# Patient Record
Sex: Female | Born: 1955
Health system: Southern US, Community
[De-identification: ages and names within clinical notes are randomized; demographics above are authoritative.]

## PROBLEM LIST (undated history)

## (undated) ENCOUNTER — Emergency Department (HOSPITAL_COMMUNITY): Payer: Medicare Other

## (undated) DIAGNOSIS — R5383 Other fatigue: Secondary | ICD-10-CM

## (undated) DIAGNOSIS — L989 Disorder of the skin and subcutaneous tissue, unspecified: Secondary | ICD-10-CM

## (undated) DIAGNOSIS — E7800 Pure hypercholesterolemia, unspecified: Secondary | ICD-10-CM

## (undated) DIAGNOSIS — E039 Hypothyroidism, unspecified: Secondary | ICD-10-CM

## (undated) DIAGNOSIS — D649 Anemia, unspecified: Secondary | ICD-10-CM

## (undated) DIAGNOSIS — J45909 Unspecified asthma, uncomplicated: Secondary | ICD-10-CM

## (undated) DIAGNOSIS — I10 Essential (primary) hypertension: Secondary | ICD-10-CM

## (undated) DIAGNOSIS — F329 Major depressive disorder, single episode, unspecified: Secondary | ICD-10-CM

## (undated) DIAGNOSIS — M1990 Unspecified osteoarthritis, unspecified site: Secondary | ICD-10-CM

## (undated) DIAGNOSIS — R011 Cardiac murmur, unspecified: Secondary | ICD-10-CM

## (undated) DIAGNOSIS — F32A Depression, unspecified: Secondary | ICD-10-CM

## (undated) DIAGNOSIS — E785 Hyperlipidemia, unspecified: Secondary | ICD-10-CM

## (undated) DIAGNOSIS — J40 Bronchitis, not specified as acute or chronic: Secondary | ICD-10-CM

## (undated) DIAGNOSIS — E669 Obesity, unspecified: Secondary | ICD-10-CM

## (undated) DIAGNOSIS — G4700 Insomnia, unspecified: Secondary | ICD-10-CM

## (undated) HISTORY — DX: Essential (primary) hypertension: I10

## (undated) HISTORY — DX: Pure hypercholesterolemia, unspecified: E78.00

## (undated) HISTORY — DX: Obesity, unspecified: E66.9

## (undated) HISTORY — DX: Insomnia, unspecified: G47.00

## (undated) HISTORY — DX: Depression, unspecified: F32.A

## (undated) HISTORY — PX: BREAST IMPLANT REMOVAL: SUR1101

## (undated) HISTORY — PX: OTHER SURGICAL HISTORY: SHX169

## (undated) HISTORY — DX: Major depressive disorder, single episode, unspecified: F32.9

## (undated) HISTORY — DX: Disorder of the skin and subcutaneous tissue, unspecified: L98.9

## (undated) HISTORY — DX: Hyperlipidemia, unspecified: E78.5

## (undated) HISTORY — PX: CARPAL TUNNEL RELEASE: SHX101

## (undated) HISTORY — DX: Other fatigue: R53.83

---

## 1974-01-27 HISTORY — PX: APPENDECTOMY: SHX54

## 1974-01-27 HISTORY — PX: OOPHORECTOMY: SHX86

## 1990-01-27 HISTORY — PX: ENDOMETRIAL FULGURATION: SHX1500

## 1998-05-14 ENCOUNTER — Other Ambulatory Visit: Admission: RE | Admit: 1998-05-14 | Discharge: 1998-05-14 | Payer: Self-pay | Admitting: *Deleted

## 1998-12-18 ENCOUNTER — Ambulatory Visit (HOSPITAL_COMMUNITY): Admission: RE | Admit: 1998-12-18 | Discharge: 1998-12-18 | Payer: Self-pay | Admitting: Gastroenterology

## 1998-12-18 ENCOUNTER — Encounter (INDEPENDENT_AMBULATORY_CARE_PROVIDER_SITE_OTHER): Payer: Self-pay

## 1999-05-29 ENCOUNTER — Other Ambulatory Visit: Admission: RE | Admit: 1999-05-29 | Discharge: 1999-05-29 | Payer: Self-pay | Admitting: *Deleted

## 1999-06-29 ENCOUNTER — Emergency Department (HOSPITAL_COMMUNITY): Admission: EM | Admit: 1999-06-29 | Discharge: 1999-06-29 | Payer: Self-pay | Admitting: Emergency Medicine

## 1999-10-04 ENCOUNTER — Other Ambulatory Visit: Admission: RE | Admit: 1999-10-04 | Discharge: 1999-10-04 | Payer: Self-pay | Admitting: *Deleted

## 2000-03-27 ENCOUNTER — Other Ambulatory Visit: Admission: RE | Admit: 2000-03-27 | Discharge: 2000-03-27 | Payer: Self-pay | Admitting: *Deleted

## 2000-07-09 ENCOUNTER — Encounter: Payer: Self-pay | Admitting: Cardiology

## 2000-07-09 ENCOUNTER — Encounter: Admission: RE | Admit: 2000-07-09 | Discharge: 2000-07-09 | Payer: Self-pay | Admitting: Cardiology

## 2001-04-12 ENCOUNTER — Other Ambulatory Visit: Admission: RE | Admit: 2001-04-12 | Discharge: 2001-04-12 | Payer: Self-pay | Admitting: *Deleted

## 2002-04-25 ENCOUNTER — Other Ambulatory Visit: Admission: RE | Admit: 2002-04-25 | Discharge: 2002-04-25 | Payer: Self-pay | Admitting: *Deleted

## 2003-05-08 ENCOUNTER — Other Ambulatory Visit: Admission: RE | Admit: 2003-05-08 | Discharge: 2003-05-08 | Payer: Self-pay | Admitting: *Deleted

## 2003-06-21 ENCOUNTER — Emergency Department (HOSPITAL_COMMUNITY): Admission: EM | Admit: 2003-06-21 | Discharge: 2003-06-22 | Payer: Self-pay | Admitting: Emergency Medicine

## 2003-09-05 ENCOUNTER — Encounter: Admission: RE | Admit: 2003-09-05 | Discharge: 2003-09-05 | Payer: Self-pay | Admitting: Orthopedic Surgery

## 2004-05-23 ENCOUNTER — Other Ambulatory Visit: Admission: RE | Admit: 2004-05-23 | Discharge: 2004-05-23 | Payer: Self-pay | Admitting: *Deleted

## 2004-08-15 ENCOUNTER — Encounter: Admission: RE | Admit: 2004-08-15 | Discharge: 2004-08-15 | Payer: Self-pay | Admitting: Orthopedic Surgery

## 2005-01-31 ENCOUNTER — Encounter: Admission: RE | Admit: 2005-01-31 | Discharge: 2005-01-31 | Payer: Self-pay | Admitting: Orthopedic Surgery

## 2005-05-30 ENCOUNTER — Other Ambulatory Visit: Admission: RE | Admit: 2005-05-30 | Discharge: 2005-05-30 | Payer: Self-pay | Admitting: Gynecology

## 2005-09-17 ENCOUNTER — Encounter: Admission: RE | Admit: 2005-09-17 | Discharge: 2005-09-17 | Payer: Self-pay | Admitting: Orthopedic Surgery

## 2006-02-02 ENCOUNTER — Encounter: Admission: RE | Admit: 2006-02-02 | Discharge: 2006-02-02 | Payer: Self-pay | Admitting: Cardiology

## 2006-06-03 ENCOUNTER — Other Ambulatory Visit: Admission: RE | Admit: 2006-06-03 | Discharge: 2006-06-03 | Payer: Self-pay | Admitting: Obstetrics and Gynecology

## 2007-02-04 ENCOUNTER — Encounter: Admission: RE | Admit: 2007-02-04 | Discharge: 2007-02-04 | Payer: Self-pay | Admitting: Obstetrics and Gynecology

## 2007-06-07 ENCOUNTER — Other Ambulatory Visit: Admission: RE | Admit: 2007-06-07 | Discharge: 2007-06-07 | Payer: Self-pay | Admitting: Obstetrics and Gynecology

## 2008-02-07 ENCOUNTER — Encounter: Admission: RE | Admit: 2008-02-07 | Discharge: 2008-02-07 | Payer: Self-pay | Admitting: Cardiology

## 2008-06-28 ENCOUNTER — Encounter: Admission: RE | Admit: 2008-06-28 | Discharge: 2008-06-28 | Payer: Self-pay | Admitting: Cardiology

## 2009-01-09 ENCOUNTER — Ambulatory Visit (HOSPITAL_BASED_OUTPATIENT_CLINIC_OR_DEPARTMENT_OTHER): Admission: RE | Admit: 2009-01-09 | Discharge: 2009-01-09 | Payer: Self-pay | Admitting: Orthopedic Surgery

## 2009-02-07 ENCOUNTER — Encounter: Admission: RE | Admit: 2009-02-07 | Discharge: 2009-02-07 | Payer: Self-pay | Admitting: Obstetrics and Gynecology

## 2009-12-25 ENCOUNTER — Encounter: Admission: RE | Admit: 2009-12-25 | Discharge: 2009-12-25 | Payer: Self-pay | Admitting: Cardiology

## 2009-12-27 ENCOUNTER — Telehealth (INDEPENDENT_AMBULATORY_CARE_PROVIDER_SITE_OTHER): Payer: Self-pay | Admitting: *Deleted

## 2010-02-08 ENCOUNTER — Encounter
Admission: RE | Admit: 2010-02-08 | Discharge: 2010-02-08 | Payer: Self-pay | Source: Home / Self Care | Attending: Obstetrics and Gynecology | Admitting: Obstetrics and Gynecology

## 2010-02-17 ENCOUNTER — Encounter: Payer: Self-pay | Admitting: Orthopedic Surgery

## 2010-02-28 NOTE — Progress Notes (Signed)
Summary: appt   Phone Note Call from Patient Call back at [phone]   Caller: Patient Call For: wright Summary of Call: Pt had to cancel her previous appt on 12/8,  was offered another on 12/15, states she needs an earlier appt, pls advise. Initial call taken by: Darletta Moll,  December 27, 2009 3:17 PM  Follow-up for Phone Call        Called number provided above - [facility]  ATC pt's home number - NA and unable to leave message.  Pt returned call.  She was placed on bump list form 01/03/10 10am appt.  She states she has not seen Dr. Deborah Chalk but spoke with his nurses and was told Dr. Deborah Chalk would order CXR but he recs she start off seeing pulmonary and recommend Dr. Delford Field.  Had CXR at [facility].  Advised PW's first available is Dec 8 at 11am in [place] -- pt ok with this app.  Advised to bring recs from previous dr, arrive 15 minutes early, and bring all meds to OV.  She verbalized understanding.  Follow-up by: Gweneth Dimitri RN,  December 27, 2009 4:02 PM

## 2010-04-30 LAB — BASIC METABOLIC PANEL
BUN: 8 mg/dL (ref 6–23)
CO2: 28 mEq/L (ref 19–32)
Calcium: 9.5 mg/dL (ref 8.4–10.5)
Chloride: 105 mEq/L (ref 96–112)
Creatinine, Ser: 0.5 mg/dL (ref 0.4–1.2)
GFR calc Af Amer: >60 mL/min (ref >60)
GFR calc non Af Amer: >60 mL/min (ref >60)
Glucose, Bld: 106 mg/dL — ABNORMAL HIGH (ref 70–99)
Potassium: 4.3 mEq/L (ref 3.5–5.1)
Sodium: 138 mEq/L (ref 135–145)

## 2010-04-30 LAB — POCT HEMOGLOBIN-HEMACUE: Hemoglobin: 13.6 g/dL (ref 12.0–15.0)

## 2010-06-11 ENCOUNTER — Telehealth: Payer: Self-pay | Admitting: Cardiology

## 2010-06-11 ENCOUNTER — Other Ambulatory Visit: Payer: Self-pay | Admitting: *Deleted

## 2010-06-11 DIAGNOSIS — E7800 Pure hypercholesterolemia, unspecified: Secondary | ICD-10-CM

## 2010-06-11 DIAGNOSIS — R5383 Other fatigue: Secondary | ICD-10-CM

## 2010-06-11 NOTE — Telephone Encounter (Signed)
Wanted to schedule labs prior to ov w/ Dr. Deborah Chalk. Done

## 2010-06-11 NOTE — Telephone Encounter (Signed)
WANTS TO COME IN EARLY FOR LABS- I DID NOT SEE ANY ORDERS FOR LABS CAN YOU CHECK AND CALL PATIENT BACK WITH APPOINTMENT TIME FOR LABS PRIOR  TO MD VISIT

## 2010-06-20 ENCOUNTER — Other Ambulatory Visit: Payer: Self-pay | Admitting: *Deleted

## 2010-06-20 DIAGNOSIS — E7800 Pure hypercholesterolemia, unspecified: Secondary | ICD-10-CM

## 2010-06-20 DIAGNOSIS — R5383 Other fatigue: Secondary | ICD-10-CM

## 2010-06-21 ENCOUNTER — Other Ambulatory Visit (INDEPENDENT_AMBULATORY_CARE_PROVIDER_SITE_OTHER): Payer: BC Managed Care – PPO | Admitting: *Deleted

## 2010-06-21 ENCOUNTER — Other Ambulatory Visit: Payer: Self-pay | Admitting: *Deleted

## 2010-06-21 DIAGNOSIS — Z79899 Other long term (current) drug therapy: Secondary | ICD-10-CM

## 2010-06-21 DIAGNOSIS — E7800 Pure hypercholesterolemia, unspecified: Secondary | ICD-10-CM

## 2010-06-21 LAB — BASIC METABOLIC PANEL
BUN: 9 mg/dL (ref 6–23)
CO2: 26 mEq/L (ref 19–32)
Calcium: 8.8 mg/dL (ref 8.4–10.5)
Chloride: 104 mEq/L (ref 96–112)
Creatinine, Ser: 0.5 mg/dL (ref 0.4–1.2)
GFR: 142.1 mL/min (ref >60.00)
Glucose, Bld: 131 mg/dL — ABNORMAL HIGH (ref 70–99)
Potassium: 4.1 mEq/L (ref 3.5–5.1)
Sodium: 137 mEq/L (ref 135–145)

## 2010-06-21 LAB — LIPID PANEL
Cholesterol: 190 mg/dL (ref 0–200)
HDL: 44.1 mg/dL (ref >39.00)
LDL Cholesterol: 110 mg/dL — ABNORMAL HIGH (ref 0–99)
Total CHOL/HDL Ratio: 4
Triglycerides: 182 mg/dL — ABNORMAL HIGH (ref 0.0–149.0)
VLDL: 36.4 mg/dL (ref 0.0–40.0)

## 2010-06-21 LAB — TSH: TSH: 2.6 u[IU]/mL (ref 0.35–5.50)

## 2010-06-21 LAB — CBC WITH DIFFERENTIAL/PLATELET
Basophils Absolute: 0 10*3/uL (ref 0.0–0.1)
Basophils Relative: 0.5 % (ref 0.0–3.0)
Eosinophils Absolute: 0.1 10*3/uL (ref 0.0–0.7)
Eosinophils Relative: 2.2 % (ref 0.0–5.0)
HCT: 38.3 % (ref 36.0–46.0)
Hemoglobin: 13.3 g/dL (ref 12.0–15.0)
Lymphocytes Relative: 38.8 % (ref 12.0–46.0)
Lymphs Abs: 1.5 10*3/uL (ref 0.7–4.0)
MCHC: 34.9 g/dL (ref 30.0–36.0)
MCV: 90.5 fl (ref 78.0–100.0)
Monocytes Absolute: 0.3 10*3/uL (ref 0.1–1.0)
Monocytes Relative: 9 % (ref 3.0–12.0)
Neutro Abs: 1.9 10*3/uL (ref 1.4–7.7)
Neutrophils Relative %: 49.5 % (ref 43.0–77.0)
Platelets: 221 10*3/uL (ref 150.0–400.0)
RBC: 4.23 Mil/uL (ref 3.87–5.11)
RDW: 12.5 % (ref 11.5–14.6)
WBC: 3.9 10*3/uL — ABNORMAL LOW (ref 4.5–10.5)

## 2010-06-21 LAB — HEPATIC FUNCTION PANEL
ALT: 35 U/L (ref 0–35)
AST: 27 U/L (ref 0–37)
Albumin: 4 g/dL (ref 3.5–5.2)
Alkaline Phosphatase: 75 U/L (ref 39–117)
Bilirubin, Direct: 0.1 mg/dL (ref 0.0–0.3)
Total Bilirubin: 0.5 mg/dL (ref 0.3–1.2)
Total Protein: 6.5 g/dL (ref 6.0–8.3)

## 2010-06-21 MED ORDER — CITALOPRAM HYDROBROMIDE 40 MG PO TABS: 40.0000 mg | ORAL_TABLET | Freq: Every day | ORAL | Status: DC

## 2010-06-21 NOTE — Telephone Encounter (Signed)
Citalopram 40mg  #30/5 refills faxed to [facility] per Norma Fredrickson, NP -this is ok

## 2010-06-26 ENCOUNTER — Other Ambulatory Visit: Payer: Self-pay | Admitting: *Deleted

## 2010-06-26 ENCOUNTER — Telehealth: Payer: Self-pay | Admitting: *Deleted

## 2010-06-26 NOTE — Telephone Encounter (Signed)
LMOM to call back for lab work

## 2010-06-26 NOTE — Telephone Encounter (Signed)
Results reported to pt and faxed to pt

## 2010-06-26 NOTE — Telephone Encounter (Signed)
Pt returning your call for lab results  °

## 2010-06-28 ENCOUNTER — Encounter: Payer: Self-pay | Admitting: *Deleted

## 2010-06-28 ENCOUNTER — Ambulatory Visit (INDEPENDENT_AMBULATORY_CARE_PROVIDER_SITE_OTHER): Payer: BC Managed Care – PPO | Admitting: Cardiology

## 2010-06-28 ENCOUNTER — Encounter: Payer: Self-pay | Admitting: Cardiology

## 2010-06-28 DIAGNOSIS — R7309 Other abnormal glucose: Secondary | ICD-10-CM

## 2010-06-28 DIAGNOSIS — I10 Essential (primary) hypertension: Secondary | ICD-10-CM | POA: Insufficient documentation

## 2010-06-28 DIAGNOSIS — E785 Hyperlipidemia, unspecified: Secondary | ICD-10-CM

## 2010-06-28 DIAGNOSIS — E669 Obesity, unspecified: Secondary | ICD-10-CM

## 2010-06-28 DIAGNOSIS — R7303 Prediabetes: Secondary | ICD-10-CM | POA: Insufficient documentation

## 2010-06-28 NOTE — Progress Notes (Signed)
Subjective:   Megan Savage is seen today for followup visit. She has a history of mild hypertension, obesity, and lipid disorder. In general, she's been doing well but is not able to exercise because of a busy schedule. Lab work is reviewed. Her glucose was 131 and LDL cholesterol was 110. Weight loss would correct all of her problems  Current Outpatient Prescriptions  Medication Sig Dispense Refill  . aspirin 81 MG tablet Take 81 mg by mouth daily.        . calcium citrate-vitamin D 200-200 MG-UNIT TABS Take 2 tablets by mouth daily.       . Cholecalciferol (VITAMIN D PO) Take 1 tablet by mouth every 30 (thirty) days.        . citalopram (CELEXA) 40 MG tablet Take 1 tablet (40 mg total) by mouth daily.  30 tablet  5  . Doxycycline Hyclate (DORYX PO) Take 1 capsule by mouth daily.        Marland Kitchen estradiol (VIVELLE-DOT) 0.025 MG/24HR Place 1 patch onto the skin 2 (two) times a week.        . Flaxseed, Linseed, (FLAXSEED OIL PO) Take 2 capsules by mouth daily.        Marland Kitchen levothyroxine (SYNTHROID, LEVOTHROID) 25 MCG tablet Take 25 mcg by mouth daily.        . MedroxyPROGESTERone Acetate (PROVERA PO) Take by mouth. Days 1-12 of every month      . multivitamin (THERAGRAN) per tablet Take 1 tablet by mouth daily.        Marland Kitchen omega-3 acid ethyl esters (LOVAZA) 1 G capsule Take 2 g by mouth 2 (two) times daily.       . valsartan (DIOVAN) 80 MG tablet Take 80 mg by mouth daily.          No Known Allergies  There is no problem list on file for this patient.   History  Smoking status  . Never Smoker   Smokeless tobacco  . Never Used    History  Alcohol Use: Not on file    Family History  Problem Relation Age of Onset  . Arrhythmia Mother   . Cancer Brother   . Cancer Father     Review of Systems:   The patient denies any heat or cold intolerance.  No weight gain or weight loss.  The patient denies headaches or blurry vision.  There is no cough or sputum production.  The patient denies dizziness.  There  is no hematuria or hematochezia.  The patient denies any muscle aches or arthritis.  The patient denies any rash.  The patient denies frequent falling or instability.  There is no history of depression or anxiety.  All other systems were reviewed and are negative.   Physical Exam:    Assessment / Plan:

## 2010-06-28 NOTE — Assessment & Plan Note (Signed)
Lipids are reasonably well controlled by to encourage weight loss. I'll have her see Dr. Timothy Lasso in followup for evaluation of lipids, weight loss, and glucose intolerance.

## 2010-07-03 NOTE — Progress Notes (Signed)
Report to pt.

## 2010-07-05 ENCOUNTER — Telehealth: Payer: Self-pay | Admitting: *Deleted

## 2010-07-05 NOTE — Telephone Encounter (Signed)
JoAnne from [facility] called to inform us she will call pt to set pt up to see Dr. Timothy Lasso.

## 2010-10-02 ENCOUNTER — Other Ambulatory Visit: Payer: Self-pay | Admitting: Critical Care Medicine

## 2010-10-03 ENCOUNTER — Ambulatory Visit (INDEPENDENT_AMBULATORY_CARE_PROVIDER_SITE_OTHER): Payer: BC Managed Care – PPO | Admitting: Critical Care Medicine

## 2010-10-03 ENCOUNTER — Ambulatory Visit (HOSPITAL_BASED_OUTPATIENT_CLINIC_OR_DEPARTMENT_OTHER)
Admission: RE | Admit: 2010-10-03 | Discharge: 2010-10-03 | Disposition: A | Discharge status: Home / Self Care | Payer: BC Managed Care – PPO | Source: Ambulatory Visit | Attending: Critical Care Medicine | Admitting: Critical Care Medicine

## 2010-10-03 ENCOUNTER — Encounter: Payer: Self-pay | Admitting: Critical Care Medicine

## 2010-10-03 VITALS — BP 140/88 | HR 80 | Temp 98.8°F | Ht 64.5 in | Wt 194.5 lb

## 2010-10-03 DIAGNOSIS — R059 Cough, unspecified: Secondary | ICD-10-CM

## 2010-10-03 DIAGNOSIS — K219 Gastro-esophageal reflux disease without esophagitis: Secondary | ICD-10-CM

## 2010-10-03 DIAGNOSIS — I10 Essential (primary) hypertension: Secondary | ICD-10-CM | POA: Insufficient documentation

## 2010-10-03 DIAGNOSIS — R05 Cough: Secondary | ICD-10-CM | POA: Insufficient documentation

## 2010-10-03 DIAGNOSIS — J45909 Unspecified asthma, uncomplicated: Secondary | ICD-10-CM | POA: Insufficient documentation

## 2010-10-03 DIAGNOSIS — R053 Chronic cough: Secondary | ICD-10-CM | POA: Insufficient documentation

## 2010-10-03 MED ORDER — HYDROCOD POLST-CPM POLST ER 10-8 MG PO CP12: 1.0000 | ORAL_CAPSULE | Freq: Two times a day (BID) | ORAL | Status: DC | PRN

## 2010-10-03 MED ORDER — BENZONATATE 100 MG PO CAPS: ORAL_CAPSULE | ORAL | Status: AC

## 2010-10-03 MED ORDER — PREDNISONE 10 MG PO TABS: ORAL_TABLET | ORAL | Status: DC

## 2010-10-03 MED ORDER — OMEPRAZOLE 20 MG PO CPDR: 20.0000 mg | DELAYED_RELEASE_CAPSULE | Freq: Every day | ORAL | Status: DC

## 2010-10-03 MED ORDER — FISH OIL 1000 MG PO CAPS: ORAL_CAPSULE | ORAL | Status: DC

## 2010-10-03 MED ORDER — BENZONATATE 100 MG PO CAPS: ORAL_CAPSULE | ORAL | Status: DC

## 2010-10-03 NOTE — Patient Instructions (Addendum)
Follow cyclic cough protocol When medrol is finished, start prednisone pulse Take omeprazole one daily before breakfast Follow STRICT reflux diet HOLD fish oil and flaxseed oil Return 2 weeks [facility] office or [facility] office

## 2010-10-03 NOTE — Progress Notes (Signed)
Subjective:    Patient ID: Megan Savage, female    DOB: July 11, 1955, 55 y.o.   MRN: [id number]  HPI Comments: Cough for 4 weeks.  Started with sore throat lasted 24hrs then started   Cough This is a new problem. The current episode started more than 1 month ago. The problem has been waxing and waning (Better in the am). The problem occurs constantly (worse with talking and exertion). The cough is productive of sputum (clear mucus,  not green or discolored ). Associated symptoms include wheezing. Pertinent negatives include no chest pain, chills, ear congestion, ear pain, fever, headaches, heartburn, hemoptysis, myalgias, nasal congestion, postnasal drip, rash, rhinorrhea, sore throat, shortness of breath or sweats. Associated symptoms comments: Fatigued from coughing . The symptoms are aggravated by exercise, lying down and stress (worse at night and as day goes on). Risk factors for lung disease include smoking/tobacco exposure (passive exposure ). She has tried a beta-agonist inhaler, oral steroids and steroid inhaler (saw gene Gallina, zpak, pred, symbicort.  none of this helped ) for the symptoms. The treatment provided no relief. Her past medical history is significant for asthma.   55 y.o. WF with cough  Past Medical History  Diagnosis Date  . Hypertension     controlled  . Hyperlipidemia   . Obesity   . Hypercholesterolemia   . Fatigue   . Depression   . Skin disorder   . Insomnia      Family History  Problem Relation Age of Onset  . Arrhythmia Mother   . Cancer Brother     leumkemia  . Cancer Father   . Emphysema Mother   . Asthma Mother   . Asthma Brother   . Asthma Paternal Aunt      History   Social History  . Marital Status: Divorced    Spouse Name: N/A    Number of Children: N/A  . Years of Education: N/A   Occupational History  . Science writer    Social History Main Topics  . Smoking status: Never Smoker   . Smokeless tobacco: Never Used  .  Alcohol Use: Yes     2-3 glasses wine daily  . Drug Use: Not on file  . Sexually Active: Not on file   Other Topics Concern  . Not on file   Social History Narrative  . No narrative on file     No Known Allergies   Outpatient Prescriptions Prior to Visit  Medication Sig Dispense Refill  . aspirin 81 MG tablet Take 81 mg by mouth daily.        . calcium citrate-vitamin D 200-200 MG-UNIT TABS Take 2 tablets by mouth daily.       . Cholecalciferol (VITAMIN D PO) Take 1 tablet by mouth every 30 (thirty) days.        . citalopram (CELEXA) 40 MG tablet Take 1 tablet (40 mg total) by mouth daily.  30 tablet  5  . Doxycycline Hyclate (DORYX PO) Take 1 capsule by mouth. 150 mg three times weekly      . estradiol (VIVELLE-DOT) 0.025 MG/24HR Place 1 patch onto the skin 2 (two) times a week.        . Flaxseed, Linseed, (FLAXSEED OIL PO) Take 2 capsules by mouth daily.        Marland Kitchen levothyroxine (SYNTHROID, LEVOTHROID) 25 MCG tablet Take 25 mcg by mouth daily.        . MedroxyPROGESTERone Acetate (PROVERA PO) Take by mouth. Days 1-12  of every month      . multivitamin (THERAGRAN) per tablet Take 1 tablet by mouth daily.        . valsartan (DIOVAN) 80 MG tablet Take 80 mg by mouth daily.        Marland Kitchen omega-3 acid ethyl esters (LOVAZA) 1 G capsule Take 2 g by mouth 2 (two) times daily.           Review of Systems  Constitutional: Positive for activity change. Negative for fever, chills, diaphoresis, appetite change, fatigue and unexpected weight change.  HENT: Positive for congestion and voice change. Negative for hearing loss, ear pain, nosebleeds, sore throat, facial swelling, rhinorrhea, sneezing, mouth sores, trouble swallowing, neck pain, neck stiffness, dental problem, postnasal drip, sinus pressure, tinnitus and ear discharge.   Eyes: Negative for photophobia, discharge, itching and visual disturbance.  Respiratory: Positive for cough and wheezing. Negative for apnea, hemoptysis, choking, chest  tightness, shortness of breath and stridor.   Cardiovascular: Negative for chest pain, palpitations and leg swelling.  Gastrointestinal: Negative for heartburn, nausea, vomiting, abdominal pain, constipation, blood in stool and abdominal distention.  Genitourinary: Negative for dysuria, urgency, frequency, hematuria, flank pain, decreased urine volume and difficulty urinating.  Musculoskeletal: Negative for myalgias, back pain, joint swelling, arthralgias and gait problem.  Skin: Negative for color change, pallor and rash.  Neurological: Negative for dizziness, tremors, seizures, syncope, speech difficulty, weakness, light-headedness, numbness and headaches.  Hematological: Negative for adenopathy. Does not bruise/bleed easily.  Psychiatric/Behavioral: Positive for sleep disturbance. Negative for confusion and agitation. The patient is not nervous/anxious.        Objective:   Physical Exam Filed Vitals:   10/03/10 0952  BP: 140/88  Pulse: 80  Temp: 98.8 F (37.1 C)  TempSrc: Oral  Height: 5' 4.5" (1.638 m)  Weight: 194 lb 8 oz (88.225 kg)  SpO2: 97%    Gen: Pleasant, mildly obese , in no distress,  normal affect  ENT: No lesions,  mouth clear,  oropharynx clear, no postnasal drip, mild pharyngeal  erythema  Neck: No JVD, no TMG, no carotid bruits  Lungs: No use of accessory muscles, no dullness to percussion, prominent  pseudo wheeze  Cardiovascular: RRR, heart sounds normal, no murmur or gallops, no peripheral edema  Abdomen: soft and NT, no HSM,  BS normal  Musculoskeletal: No deformities, no cyanosis or clubbing  Neuro: alert, non focal  Skin: Warm, no lesions or rashes  Unable to perform spirometry  10/03/10 CXR: NAD    Assessment & Plan:   Cough 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   Likely laryngopharyngeal reflux induced.   No true asthma or lower airway inflammation.  Plan Follow cyclic cough protocol with hycodan cough  syrup/benzonatate When medrol is finished, start prednisone pulse Take omeprazole one daily before breakfast Follow STRICT reflux diet HOLD fish oil and flaxseed oil Return 2 weeks [facility] office or [facility] office      Updated Medication List Outpatient Encounter Prescriptions as of 10/03/2010  Medication Sig Dispense Refill  . aspirin 81 MG tablet Take 81 mg by mouth daily.        Marland Kitchen azithromycin (ZITHROMAX) 250 MG tablet Take as directed      . calcium citrate-vitamin D 200-200 MG-UNIT TABS Take 2 tablets by mouth daily.       . Cholecalciferol (VITAMIN D PO) Take 1 tablet by mouth every 30 (thirty) days.        . citalopram (CELEXA) 40 MG tablet Take  1 tablet (40 mg total) by mouth daily.  30 tablet  5  . Doxycycline Hyclate (DORYX PO) Take 1 capsule by mouth. 150 mg three times weekly      . estradiol (VIVELLE-DOT) 0.025 MG/24HR Place 1 patch onto the skin 2 (two) times a week.        . Flaxseed, Linseed, (FLAXSEED OIL PO) Take 2 capsules by mouth daily.        Marland Kitchen glucosamine-chondroitin 500-400 MG tablet Take 2 tablets by mouth daily.       Marland Kitchen levothyroxine (SYNTHROID, LEVOTHROID) 25 MCG tablet Take 25 mcg by mouth daily.        . MedroxyPROGESTERone Acetate (PROVERA PO) Take by mouth. Days 1-12 of every month      . multivitamin (THERAGRAN) per tablet Take 1 tablet by mouth daily.        . Omega-3 Fatty Acids (FISH OIL) 1000 MG CAPS HOLD      . valsartan (DIOVAN) 80 MG tablet Take 80 mg by mouth daily.        Marland Kitchen DISCONTD: methylPREDNISolone (MEDROL) 4 MG tablet Take as directed      . DISCONTD: Omega-3 Fatty Acids (FISH OIL) 1000 MG CAPS Take 1 capsule by mouth daily.        . benzonatate (TESSALON) 100 MG capsule Take one every 4 hours per cough protocol  90 capsule  4  . Hydrocod Polst-Chlorphen Polst (TUSSICAPS) 10-8 MG CP12 Take 1 capsule by mouth 2 (two) times daily as needed. Per cough protocol  12 each  0  . omeprazole (PRILOSEC) 20 MG capsule Take 1 capsule (20 mg total) by  mouth daily.  30 capsule  1  . predniSONE (DELTASONE) 10 MG tablet Take 4 for two days, three for two days, two for two days, then one for two days and stop   20 tablet  0  . DISCONTD: benzonatate (TESSALON) 100 MG capsule Take one every 4 hours per cough protocol  90 capsule  4  . DISCONTD: omega-3 acid ethyl esters (LOVAZA) 1 G capsule Take 2 g by mouth 2 (two) times daily.       Marland Kitchen DISCONTD: omeprazole (PRILOSEC) 20 MG capsule Take 1 capsule (20 mg total) by mouth daily.  30 capsule  1  . DISCONTD: predniSONE (DELTASONE) 10 MG tablet Take 4 for two days, three for two days, two for two days, then one for two days and stop   20 tablet  0

## 2010-10-04 NOTE — Assessment & Plan Note (Addendum)
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   Likely laryngopharyngeal reflux induced.   No true asthma or lower airway inflammation.  Plan Follow cyclic cough protocol with hycodan cough syrup/benzonatate When medrol is finished, start prednisone pulse Take omeprazole one daily before breakfast Follow STRICT reflux diet HOLD fish oil and flaxseed oil Return 2 weeks [facility] office or [facility] office

## 2010-10-16 ENCOUNTER — Ambulatory Visit (INDEPENDENT_AMBULATORY_CARE_PROVIDER_SITE_OTHER): Payer: BC Managed Care – PPO | Admitting: Critical Care Medicine

## 2010-10-16 ENCOUNTER — Encounter: Payer: Self-pay | Admitting: Critical Care Medicine

## 2010-10-16 VITALS — BP 140/82 | HR 94 | Temp 99.2°F | Ht 64.5 in | Wt 192.8 lb

## 2010-10-16 DIAGNOSIS — R05 Cough: Secondary | ICD-10-CM

## 2010-10-16 DIAGNOSIS — R059 Cough, unspecified: Secondary | ICD-10-CM

## 2010-10-16 NOTE — Assessment & Plan Note (Signed)
Cyclical cough d/t GERD and upper airway instability, stress induced,  No true asthma Improved with cyclic cough protocol Plan Benzonatate prn Chill fish oil Finish current omeprazole rx  Cont candy lozenge . Swallow training Stress modification Reflux diet rov prn

## 2010-10-16 NOTE — Patient Instructions (Addendum)
When current omeprazole runs out, ok to not refill Continue to follow reflux diet Hold flaxseed and fish oil for one more week, when you restart, chill capsules Continue benzonatate as needed Continue lozenge in throat and retrain yourself to not clear throat, swallow Return as needed

## 2010-10-16 NOTE — Progress Notes (Signed)
Subjective:    Patient ID: Megan Savage, female    DOB: 1955-06-25, 55 y.o.   MRN: [id number]  HPI Comments: Cough for 4 weeks.  Started with sore throat lasted 24hrs then started   Cough This is a new problem. The current episode started more than 1 month ago. The problem has been rapidly improving (Better in the am). The cough is productive of sputum (clear mucus,  not green or discolored ). Pertinent negatives include no chest pain, chills, ear congestion, ear pain, fever, headaches, heartburn, hemoptysis, myalgias, nasal congestion, postnasal drip, rash, rhinorrhea, sore throat, shortness of breath, sweats or wheezing. The symptoms are aggravated by exercise, lying down and stress (worse at night and as day goes on). Risk factors for lung disease include smoking/tobacco exposure (passive exposure ). She has tried a beta-agonist inhaler, oral steroids and steroid inhaler for the symptoms. The treatment provided no relief. There is no history of asthma.     10/16/2010 Pt is markedly better.  Less dyspneic.  Cough protocol helped Pt denies any significant sore throat, nasal congestion or excess secretions, fever, chills, sweats, unintended weight loss, pleurtic or exertional chest pain, orthopnea PND, or leg swelling Pt denies any increase in rescue therapy over baseline, denies waking up needing it or having any early am or nocturnal exacerbations of coughing/wheezing/or dyspnea. Pt also denies any obvious fluctuation in symptoms with  weather or environmental change or other alleviating or aggravating factors   Past Medical History  Diagnosis Date  . Hypertension     controlled  . Hyperlipidemia   . Obesity   . Hypercholesterolemia   . Fatigue   . Depression   . Skin disorder   . Insomnia      Family History  Problem Relation Age of Onset  . Arrhythmia Mother   . Cancer Brother     leumkemia  . Cancer Father   . Emphysema Mother   . Asthma Mother   . Asthma Brother   .  Asthma Paternal Aunt      History   Social History  . Marital Status: Divorced    Spouse Name: N/A    Number of Children: N/A  . Years of Education: N/A   Occupational History  . Science writer    Social History Main Topics  . Smoking status: Never Smoker   . Smokeless tobacco: Never Used  . Alcohol Use: Yes     2-3 glasses wine daily  . Drug Use: Not on file  . Sexually Active: Not on file   Other Topics Concern  . Not on file   Social History Narrative  . No narrative on file     No Known Allergies   Outpatient Prescriptions Prior to Visit  Medication Sig Dispense Refill  . aspirin 81 MG tablet Take 81 mg by mouth daily.        . calcium citrate-vitamin D 200-200 MG-UNIT TABS Take 2 tablets by mouth daily.       . Cholecalciferol (VITAMIN D PO) Take 1 tablet by mouth every 30 (thirty) days.        . citalopram (CELEXA) 40 MG tablet Take 1 tablet (40 mg total) by mouth daily.  30 tablet  5  . Doxycycline Hyclate (DORYX PO) Take 1 capsule by mouth. 150 mg three times weekly      . estradiol (VIVELLE-DOT) 0.025 MG/24HR Place 1 patch onto the skin 2 (two) times a week.        Marland Kitchen  glucosamine-chondroitin 500-400 MG tablet Take 2 tablets by mouth daily.       Marland Kitchen levothyroxine (SYNTHROID, LEVOTHROID) 25 MCG tablet Take 25 mcg by mouth daily.        . MedroxyPROGESTERone Acetate (PROVERA PO) Take by mouth. Days 1-12 of every month      . multivitamin (THERAGRAN) per tablet Take 1 tablet by mouth daily.        Marland Kitchen omeprazole (PRILOSEC) 20 MG capsule Take 1 capsule (20 mg total) by mouth daily.  30 capsule  1  . valsartan (DIOVAN) 80 MG tablet Take 80 mg by mouth daily.        . Flaxseed, Linseed, (FLAXSEED OIL PO) Take 2 capsules by mouth daily. ON HOLD      . Omega-3 Fatty Acids (FISH OIL) 1000 MG CAPS HOLD      . azithromycin (ZITHROMAX) 250 MG tablet Take as directed      . Hydrocod Polst-Chlorphen Polst (TUSSICAPS) 10-8 MG CP12 Take 1 capsule by mouth 2 (two) times daily  as needed. Per cough protocol  12 each  0  . predniSONE (DELTASONE) 10 MG tablet Take 4 for two days, three for two days, two for two days, then one for two days and stop   20 tablet  0      Review of Systems  Constitutional: Positive for activity change. Negative for fever, chills, diaphoresis, appetite change, fatigue and unexpected weight change.  HENT: Positive for congestion and voice change. Negative for hearing loss, ear pain, nosebleeds, sore throat, facial swelling, rhinorrhea, sneezing, mouth sores, trouble swallowing, neck pain, neck stiffness, dental problem, postnasal drip, sinus pressure, tinnitus and ear discharge.   Eyes: Negative for photophobia, discharge, itching and visual disturbance.  Respiratory: Positive for cough. Negative for apnea, hemoptysis, choking, chest tightness, shortness of breath, wheezing and stridor.   Cardiovascular: Negative for chest pain, palpitations and leg swelling.  Gastrointestinal: Negative for heartburn, nausea, vomiting, abdominal pain, constipation, blood in stool and abdominal distention.  Genitourinary: Negative for dysuria, urgency, frequency, hematuria, flank pain, decreased urine volume and difficulty urinating.  Musculoskeletal: Negative for myalgias, back pain, joint swelling, arthralgias and gait problem.  Skin: Negative for color change, pallor and rash.  Neurological: Negative for dizziness, tremors, seizures, syncope, speech difficulty, weakness, light-headedness, numbness and headaches.  Hematological: Negative for adenopathy. Does not bruise/bleed easily.  Psychiatric/Behavioral: Positive for sleep disturbance. Negative for confusion and agitation. The patient is not nervous/anxious.        Objective:   Physical Exam  Filed Vitals:   10/16/10 1035  BP: 140/82  Pulse: 94  Temp: 99.2 F (37.3 C)  TempSrc: Oral  Height: 5' 4.5" (1.638 m)  Weight: 192 lb 12.8 oz (87.454 kg)  SpO2: 95%    Gen: Pleasant, mildly obese , in  no distress,  normal affect  ENT: No lesions,  mouth clear,  oropharynx clear, no postnasal drip, mild pharyngeal  erythema  Neck: No JVD, no TMG, no carotid bruits  Lungs: No use of accessory muscles, no dullness to percussion, no pseudowheeze  Cardiovascular: RRR, heart sounds normal, no murmur or gallops, no peripheral edema  Abdomen: soft and NT, no HSM,  BS normal  Musculoskeletal: No deformities, no cyanosis or clubbing  Neuro: alert, non focal  Skin: Warm, no lesions or rashes  Unable to perform spirometry  10/03/10 CXR: NAD    Assessment & Plan:   Cough Cyclical cough d/t GERD and upper airway instability, stress induced,  No true asthma  Improved with cyclic cough protocol Plan Benzonatate prn Chill fish oil Finish current omeprazole rx  Cont candy lozenge . Swallow training Stress modification Reflux diet rov prn       Updated Medication List Outpatient Encounter Prescriptions as of 10/16/2010  Medication Sig Dispense Refill  . aspirin 81 MG tablet Take 81 mg by mouth daily.        . benzonatate (TESSALON) 100 MG capsule Take 1 capsule by mouth Every 4 hours as needed.      . calcium citrate-vitamin D 200-200 MG-UNIT TABS Take 2 tablets by mouth daily.       . Cholecalciferol (VITAMIN D PO) Take 1 tablet by mouth every 30 (thirty) days.        . citalopram (CELEXA) 40 MG tablet Take 1 tablet (40 mg total) by mouth daily.  30 tablet  5  . Doxycycline Hyclate (DORYX PO) Take 1 capsule by mouth. 150 mg three times weekly      . estradiol (VIVELLE-DOT) 0.025 MG/24HR Place 1 patch onto the skin 2 (two) times a week.        Marland Kitchen glucosamine-chondroitin 500-400 MG tablet Take 2 tablets by mouth daily.       Marland Kitchen levothyroxine (SYNTHROID, LEVOTHROID) 25 MCG tablet Take 25 mcg by mouth daily.        . MedroxyPROGESTERone Acetate (PROVERA PO) Take by mouth. Days 1-12 of every month      . multivitamin (THERAGRAN) per tablet Take 1 tablet by mouth daily.        Marland Kitchen  omeprazole (PRILOSEC) 20 MG capsule Take 1 capsule (20 mg total) by mouth daily.  30 capsule  1  . valsartan (DIOVAN) 80 MG tablet Take 80 mg by mouth daily.        . Flaxseed, Linseed, (FLAXSEED OIL PO) Take 2 capsules by mouth daily. ON HOLD      . Omega-3 Fatty Acids (FISH OIL) 1000 MG CAPS HOLD      . DISCONTD: azithromycin (ZITHROMAX) 250 MG tablet Take as directed      . DISCONTD: Hydrocod Polst-Chlorphen Polst (TUSSICAPS) 10-8 MG CP12 Take 1 capsule by mouth 2 (two) times daily as needed. Per cough protocol  12 each  0  . DISCONTD: predniSONE (DELTASONE) 10 MG tablet Take 4 for two days, three for two days, two for two days, then one for two days and stop   20 tablet  0

## 2010-11-26 ENCOUNTER — Other Ambulatory Visit: Payer: Self-pay | Admitting: *Deleted

## 2010-11-26 MED ORDER — LEVOTHYROXINE SODIUM 25 MCG PO TABS: 25.0000 ug | ORAL_TABLET | Freq: Every day | ORAL | Status: DC

## 2011-01-15 ENCOUNTER — Other Ambulatory Visit: Payer: Self-pay | Admitting: Obstetrics and Gynecology

## 2011-01-15 DIAGNOSIS — Z1231 Encounter for screening mammogram for malignant neoplasm of breast: Secondary | ICD-10-CM

## 2011-02-10 ENCOUNTER — Ambulatory Visit
Admission: RE | Admit: 2011-02-10 | Discharge: 2011-02-10 | Disposition: A | Discharge status: Home / Self Care | Payer: BC Managed Care – PPO | Source: Ambulatory Visit | Attending: Obstetrics and Gynecology | Admitting: Obstetrics and Gynecology

## 2011-02-10 DIAGNOSIS — Z1231 Encounter for screening mammogram for malignant neoplasm of breast: Secondary | ICD-10-CM

## 2011-06-16 ENCOUNTER — Other Ambulatory Visit: Payer: Self-pay | Admitting: Cardiology

## 2011-12-12 ENCOUNTER — Other Ambulatory Visit: Payer: Self-pay | Admitting: Obstetrics and Gynecology

## 2011-12-12 DIAGNOSIS — Z1231 Encounter for screening mammogram for malignant neoplasm of breast: Secondary | ICD-10-CM

## 2012-01-22 ENCOUNTER — Other Ambulatory Visit: Payer: Self-pay | Admitting: Obstetrics and Gynecology

## 2012-02-11 ENCOUNTER — Ambulatory Visit: Payer: BC Managed Care – PPO

## 2012-02-12 ENCOUNTER — Ambulatory Visit
Admission: RE | Admit: 2012-02-12 | Discharge: 2012-02-12 | Disposition: A | Discharge status: Home / Self Care | Payer: BC Managed Care – PPO | Source: Ambulatory Visit | Attending: Obstetrics and Gynecology | Admitting: Obstetrics and Gynecology

## 2012-02-12 DIAGNOSIS — Z1231 Encounter for screening mammogram for malignant neoplasm of breast: Secondary | ICD-10-CM

## 2013-01-10 ENCOUNTER — Other Ambulatory Visit: Payer: Self-pay

## 2013-01-10 DIAGNOSIS — Z1231 Encounter for screening mammogram for malignant neoplasm of breast: Secondary | ICD-10-CM

## 2013-01-10 DIAGNOSIS — Z9886 Personal history of breast implant removal: Secondary | ICD-10-CM

## 2013-02-14 ENCOUNTER — Ambulatory Visit
Admission: RE | Admit: 2013-02-14 | Discharge: 2013-02-14 | Disposition: A | Discharge status: Home / Self Care | Payer: BC Managed Care – PPO | Source: Ambulatory Visit

## 2013-02-14 DIAGNOSIS — Z9886 Personal history of breast implant removal: Secondary | ICD-10-CM

## 2013-02-14 DIAGNOSIS — Z1231 Encounter for screening mammogram for malignant neoplasm of breast: Secondary | ICD-10-CM

## 2013-02-24 ENCOUNTER — Other Ambulatory Visit: Payer: Self-pay | Admitting: Obstetrics and Gynecology

## 2013-09-23 ENCOUNTER — Telehealth: Payer: Self-pay | Admitting: *Deleted

## 2013-09-23 ENCOUNTER — Ambulatory Visit (INDEPENDENT_AMBULATORY_CARE_PROVIDER_SITE_OTHER): Payer: BC Managed Care – PPO

## 2013-09-23 ENCOUNTER — Ambulatory Visit: Payer: Self-pay

## 2013-09-23 VITALS — BP 120/69 | HR 64 | Resp 12

## 2013-09-23 DIAGNOSIS — L03039 Cellulitis of unspecified toe: Secondary | ICD-10-CM

## 2013-09-23 DIAGNOSIS — M10072 Idiopathic gout, left ankle and foot: Secondary | ICD-10-CM

## 2013-09-23 DIAGNOSIS — L723 Sebaceous cyst: Secondary | ICD-10-CM

## 2013-09-23 DIAGNOSIS — L02619 Cutaneous abscess of unspecified foot: Secondary | ICD-10-CM

## 2013-09-23 DIAGNOSIS — L720 Epidermal cyst: Secondary | ICD-10-CM

## 2013-09-23 DIAGNOSIS — M109 Gout, unspecified: Secondary | ICD-10-CM

## 2013-09-23 MED ORDER — CEPHALEXIN 500 MG PO CAPS: 500.0000 mg | ORAL_CAPSULE | Freq: Three times a day (TID) | ORAL | Status: DC

## 2013-09-23 NOTE — Patient Instructions (Signed)

## 2013-09-23 NOTE — Progress Notes (Signed)
   Subjective:    Patient ID: Megan Savage, female    DOB: [birth year], 58 y.o.   MRN: [id number]  HPI  PT STATED LT FOOT 3RD TOE HAVE A BUMP AND IS BEEN HURTING FOR 5 DAYS. THE TOE IS GETTING WORSE. THE TOE GET AGGRAVATED BY PRESSURE. TRIED NO TREATMENT.  Review of Systems  Constitutional: Positive for unexpected weight change.  All other systems reviewed and are negative.      Objective:   Physical Exam 58 year old white female well-developed well-nourished oriented x3 presents at this time with a painful lesion bump on the dorsolateral aspect third toe left foot there for about 5 days is been hurting hospital per week history with shoe pressure direct compression no previous treatment is been noted it's just distal to the IP joint of the third digit. There is an erythematous white elevated appears to be like a pimple or cyst cannot rule out mucoid cyst or inclusion cyst. Extremity objective findings reveal neurovascular status is intact pedal pulses are palpable epicritic and proprioceptive sensations intact and symmetric bilateral or plantar response DTRs not listed neurologically skin color pigment normal hair growth absent nails unremarkable there is a nucleated lesion which appears to be a cyst or inclusion cyst over the IP joint of the distal IP joint second digit correction third digit left foot remainder of exam unremarkable no x-rays taken at this time       Assessment & Plan:  Assessment suspect cyst or inclusion cyst or abscess versus a tophaceous lesion of third digit left foot this time local anesthetic block administered to the third toe  Performed the attempted aspiration was carried out however the lesion did not aspirate however was we did create a puncture of the roof the lesion of white drainage was identified it appeared to be almost granular such as a tophus from gout versus a cheesy bacterial drainage. At this time culture of the small abscess was carried out and  patient is placed empirically on cephalexin antibiotic x10 days 500 mg 3 times a day x10 days. Betadine and the gauze dressing are applied at this time patient will initiate Betadine soaks and Neosporin Band-Aid or gauze dressings daily recheck in one week for followup again cannot rule out a gout although not adequate sample was available for examination of the specimen for crystal studies. At this time again will rule out an abscess or infection daily soaking the lesion was thoroughly drained at this time and dressed with dressing applied treatment, see for pain daily draining daily soaking as instructed and complete antibiotic regimen over 10 days next progress Harriet Masson DPM

## 2013-09-23 NOTE — Telephone Encounter (Signed)
Yes patient can go swimming in a well-maintained cool without problem. Patient was given instructions for Betadine or Epsom salts in warm water soaks and Neosporin and Band-Aid dressing changes daily. On the day she go swimming keep the bandage in place for her swim then immediately removed dry the area and reapply Neosporin and a fresh dry Band-Aid.  Contact us if there is any changes or exacerbations caused by swimming or by the dressing changes.  Harriet Masson DPM

## 2013-09-23 NOTE — Telephone Encounter (Signed)
I was in this morning. He lanced a cyst on my toe.  I have a dressing on it.  I typically go swimming, I do water aerobics.  Will I be able to do that on Monday for about a hour each day.  So my question is will I be able to do that?

## 2013-09-23 NOTE — Telephone Encounter (Signed)
I called and left her a message of Dr. Erasmo Downer response.  It's okay to go swimming with bandaid in tack.  Once you get out, dry it and reapply a bandaid with Neosporin.  Call if you have any problems.  Have a great weekend.

## 2013-09-23 NOTE — Addendum Note (Signed)
Addended by: Elta Guadeloupe on: 09/23/2013 01:50 PM   Modules accepted: Orders

## 2013-09-30 ENCOUNTER — Ambulatory Visit (INDEPENDENT_AMBULATORY_CARE_PROVIDER_SITE_OTHER): Payer: BC Managed Care – PPO

## 2013-09-30 VITALS — BP 138/64 | HR 84 | Resp 12

## 2013-09-30 DIAGNOSIS — L720 Epidermal cyst: Secondary | ICD-10-CM

## 2013-09-30 DIAGNOSIS — M109 Gout, unspecified: Secondary | ICD-10-CM

## 2013-09-30 DIAGNOSIS — L723 Sebaceous cyst: Secondary | ICD-10-CM

## 2013-09-30 DIAGNOSIS — M10072 Idiopathic gout, left ankle and foot: Secondary | ICD-10-CM

## 2013-09-30 NOTE — Progress Notes (Signed)
   Subjective:    Patient ID: Megan Savage, female    DOB: 10-Apr-1955, 58 y.o.   MRN: [id number]  HPI  ''TOE IS DOING MUCH BETTER BUT STILL A LITTLE SORE.''  Review of Systems no new findings or systemic changes noted     Objective:   Physical Exam Motion objective findings unchanged neurovascular status culture reports are unremarkable no gross noted no white blood cells identified final report growth this may be a sterile abscess versus a tophaceous or gouty abscess versus inclusion cyst.       Assessment & Plan:  Assessment cannot rule out posterior tophaceous gouty rupture normal uric acid levels were done at her primary doctors office and were normal cultures were negative cannot rule out sterile abscess. Appears to be healing well been soaking Epson salts maintain Neosporin and Band-Aid will continue to do so until healed discharge to an as-needed basis if not heal within the next month followup as needed or if there's any exacerbations contact us immediately  Harriet Masson DPM

## 2014-01-09 ENCOUNTER — Other Ambulatory Visit: Payer: Self-pay

## 2014-01-09 DIAGNOSIS — Z1231 Encounter for screening mammogram for malignant neoplasm of breast: Secondary | ICD-10-CM

## 2014-02-15 ENCOUNTER — Ambulatory Visit
Admission: RE | Admit: 2014-02-15 | Discharge: 2014-02-15 | Disposition: A | Discharge status: Home / Self Care | Payer: BLUE CROSS/BLUE SHIELD | Source: Ambulatory Visit

## 2014-02-15 DIAGNOSIS — Z1231 Encounter for screening mammogram for malignant neoplasm of breast: Secondary | ICD-10-CM

## 2014-03-06 ENCOUNTER — Other Ambulatory Visit: Payer: Self-pay | Admitting: Obstetrics and Gynecology

## 2014-03-07 LAB — CYTOLOGY - PAP

## 2014-06-08 ENCOUNTER — Ambulatory Visit (INDEPENDENT_AMBULATORY_CARE_PROVIDER_SITE_OTHER): Payer: BLUE CROSS/BLUE SHIELD

## 2014-06-08 ENCOUNTER — Ambulatory Visit (INDEPENDENT_AMBULATORY_CARE_PROVIDER_SITE_OTHER): Payer: BLUE CROSS/BLUE SHIELD | Admitting: Podiatry

## 2014-06-08 DIAGNOSIS — M2040 Other hammer toe(s) (acquired), unspecified foot: Secondary | ICD-10-CM | POA: Diagnosis not present

## 2014-06-08 DIAGNOSIS — R52 Pain, unspecified: Secondary | ICD-10-CM | POA: Diagnosis not present

## 2014-06-08 DIAGNOSIS — L84 Corns and callosities: Secondary | ICD-10-CM

## 2014-06-08 DIAGNOSIS — M779 Enthesopathy, unspecified: Secondary | ICD-10-CM

## 2014-06-08 MED ORDER — TRIAMCINOLONE ACETONIDE 10 MG/ML IJ SUSP
10.0000 mg | Freq: Once | INTRAMUSCULAR | Status: AC
  Administered 2014-06-08: 10 mg

## 2014-06-11 NOTE — Progress Notes (Signed)
Subjective:     Patient ID: Megan Savage, female   DOB: 1955/02/07, 59 y.o.   MRN: [id number]  HPI patient presents stating I got a painful fifth toe right and I did traumatized the toe and I'm not sure as to whether or not I may have broken it. There is also corn formation which makes it hard to walk comfortably and I have some of that on the left foot to   Review of Systems  All other systems reviewed and are negative.      Objective:   Physical Exam  Cardiovascular: Intact distal pulses.   Musculoskeletal: Normal range of motion.  Skin: Skin is warm.  Nursing note and vitals reviewed.  neurovascular status intact with muscle strength adequate and range of motion subtalar and midtarsal joint within normal limits. Patient's found to have inflammation had a proximal phalanx fifth toe right that's painful and distal lateral keratotic lesion fifth toe right over left with significant rotation of the toe and also swelling of the fifth toe right foot     Assessment:     Probable digital deformity along with rotational component of the toe fluid buildup and pain with palpation. Possible fracture noted    Plan:     H&P and x-rays reviewed with patient. Today I did a proximal nerve block I then injected the right interphalangeal joint fifth toe right with 2 mg dexamethasone 2 mg Xylocaine and debrided lesion fully on the fifth toe and on the distal aspect of the fifth toe bilateral and dispensed padding. Reappoint when symptomatic again for consideration of surgical intervention which was reviewed as far as hammertoe goes with patient

## 2014-12-19 ENCOUNTER — Other Ambulatory Visit: Payer: Self-pay | Admitting: Nurse Practitioner

## 2014-12-19 DIAGNOSIS — N644 Mastodynia: Secondary | ICD-10-CM

## 2014-12-20 ENCOUNTER — Ambulatory Visit
Admission: RE | Admit: 2014-12-20 | Discharge: 2014-12-20 | Disposition: A | Discharge status: Home / Self Care | Payer: BLUE CROSS/BLUE SHIELD | Source: Ambulatory Visit | Attending: Nurse Practitioner | Admitting: Nurse Practitioner

## 2014-12-20 DIAGNOSIS — N644 Mastodynia: Secondary | ICD-10-CM

## 2014-12-27 ENCOUNTER — Other Ambulatory Visit: Payer: BLUE CROSS/BLUE SHIELD

## 2015-01-09 ENCOUNTER — Other Ambulatory Visit: Payer: Self-pay

## 2015-01-09 DIAGNOSIS — Z1231 Encounter for screening mammogram for malignant neoplasm of breast: Secondary | ICD-10-CM

## 2015-02-19 ENCOUNTER — Ambulatory Visit
Admission: RE | Admit: 2015-02-19 | Discharge: 2015-02-19 | Disposition: A | Discharge status: Home / Self Care | Payer: BLUE CROSS/BLUE SHIELD | Source: Ambulatory Visit

## 2015-02-19 DIAGNOSIS — Z1231 Encounter for screening mammogram for malignant neoplasm of breast: Secondary | ICD-10-CM

## 2015-12-17 ENCOUNTER — Other Ambulatory Visit: Payer: Self-pay | Admitting: Internal Medicine

## 2015-12-17 DIAGNOSIS — Z1231 Encounter for screening mammogram for malignant neoplasm of breast: Secondary | ICD-10-CM

## 2016-02-21 ENCOUNTER — Ambulatory Visit: Payer: BLUE CROSS/BLUE SHIELD

## 2016-03-03 ENCOUNTER — Ambulatory Visit
Admission: RE | Admit: 2016-03-03 | Discharge: 2016-03-03 | Disposition: A | Discharge status: Home / Self Care | Payer: BLUE CROSS/BLUE SHIELD | Source: Ambulatory Visit | Attending: Internal Medicine | Admitting: Internal Medicine

## 2016-03-03 DIAGNOSIS — Z1231 Encounter for screening mammogram for malignant neoplasm of breast: Secondary | ICD-10-CM

## 2017-01-23 ENCOUNTER — Other Ambulatory Visit: Payer: Self-pay | Admitting: Internal Medicine

## 2017-01-23 DIAGNOSIS — Z1231 Encounter for screening mammogram for malignant neoplasm of breast: Secondary | ICD-10-CM

## 2017-03-04 ENCOUNTER — Ambulatory Visit
Admission: RE | Admit: 2017-03-04 | Discharge: 2017-03-04 | Disposition: A | Discharge status: Home / Self Care | Payer: BLUE CROSS/BLUE SHIELD | Source: Ambulatory Visit | Attending: Internal Medicine | Admitting: Internal Medicine

## 2017-03-04 DIAGNOSIS — Z1231 Encounter for screening mammogram for malignant neoplasm of breast: Secondary | ICD-10-CM

## 2017-03-31 DIAGNOSIS — M25551 Pain in right hip: Secondary | ICD-10-CM | POA: Insufficient documentation

## 2017-05-28 DIAGNOSIS — M25532 Pain in left wrist: Secondary | ICD-10-CM | POA: Insufficient documentation

## 2017-07-07 ENCOUNTER — Other Ambulatory Visit: Payer: Self-pay | Admitting: Internal Medicine

## 2017-07-07 DIAGNOSIS — E781 Pure hyperglyceridemia: Secondary | ICD-10-CM

## 2017-07-17 ENCOUNTER — Ambulatory Visit
Admission: RE | Admit: 2017-07-17 | Discharge: 2017-07-17 | Disposition: A | Discharge status: Home / Self Care | Payer: BLUE CROSS/BLUE SHIELD | Source: Ambulatory Visit | Attending: Internal Medicine | Admitting: Internal Medicine

## 2017-07-17 DIAGNOSIS — E781 Pure hyperglyceridemia: Secondary | ICD-10-CM

## 2017-07-21 DIAGNOSIS — R911 Solitary pulmonary nodule: Secondary | ICD-10-CM | POA: Insufficient documentation

## 2017-08-24 ENCOUNTER — Telehealth: Payer: Self-pay | Admitting: Cardiovascular Disease

## 2017-08-24 NOTE — Telephone Encounter (Signed)
Left message to call back  

## 2017-08-24 NOTE — Telephone Encounter (Signed)
Pt returning call to nurse

## 2017-08-24 NOTE — Telephone Encounter (Signed)
New message  Pt  Is calling  Because she received a message that she can see Dr. Claiborne Billings on 08/31/2017 not showing any notes indicating this. Pt would like an earlier appt. Pls call to assist.

## 2017-08-24 NOTE — Telephone Encounter (Signed)
Spoke with pt, appointment rescheduled to 08-31-17.

## 2017-08-31 ENCOUNTER — Ambulatory Visit: Payer: BLUE CROSS/BLUE SHIELD | Admitting: Cardiovascular Disease

## 2017-08-31 ENCOUNTER — Encounter: Payer: Self-pay | Admitting: Cardiovascular Disease

## 2017-08-31 VITALS — BP 162/82 | HR 76 | Ht 64.5 in | Wt 188.6 lb

## 2017-08-31 DIAGNOSIS — I10 Essential (primary) hypertension: Secondary | ICD-10-CM | POA: Diagnosis not present

## 2017-08-31 DIAGNOSIS — E039 Hypothyroidism, unspecified: Secondary | ICD-10-CM | POA: Diagnosis not present

## 2017-08-31 DIAGNOSIS — Z79899 Other long term (current) drug therapy: Secondary | ICD-10-CM | POA: Diagnosis not present

## 2017-08-31 DIAGNOSIS — E782 Mixed hyperlipidemia: Secondary | ICD-10-CM | POA: Diagnosis not present

## 2017-08-31 DIAGNOSIS — R911 Solitary pulmonary nodule: Secondary | ICD-10-CM

## 2017-08-31 MED ORDER — OLMESARTAN MEDOXOMIL 40 MG PO TABS: 40.0000 mg | ORAL_TABLET | Freq: Every day | ORAL | 3 refills | Status: DC

## 2017-08-31 MED ORDER — SPIRONOLACTONE 25 MG PO TABS: 12.5000 mg | ORAL_TABLET | Freq: Every day | ORAL | 3 refills | Status: DC

## 2017-08-31 NOTE — Progress Notes (Signed)
Cardiology Office Note    Date:  09/06/2017   ID:  Megan Savage, DOB 11-13-1955, MRN [id number]  PCP:  Shon Baton, MD  Cardiologist:  Shelva Majestic, MD   Chief Complaint  Patient presents with  . New Patient (Initial Visit)    BP continues to be high even with medications.     History of Present Illness:  Megan Savage is a 62 y.o. female who is followed by Dr. Shon Baton for primary care.  The patient is self-referred for evaluation of recent difficult to control hypertension in addition to cardiovascular risk assessment.  Megan Savage is known to me.  She remotely had been seen by Dr. Doreatha Lew in 2012 and at that time had mild hypertension, hypohyroidism, obesity, and hyperlipidemia.  She was on valsartan 80 mg, levothyroxine 25 mcg, and omega-3 fatty acid.  She lost a significant amount of weight approximately 4 years ago and it and with a 36 pound weight loss her blood pressure improved and apparently she was taken off medication.  She had been started back on blood pressure medication in February 2019 and due to the valsartan recall was initially started on losartan.  She is now on 100 mg daily.  Bite this, she has noticed that her blood pressures have been consistently elevated.  She has regained some of her weight back.  On July 17, 2017 undergone a CT calcium score was 0.  She was incidentally found to have a right lower lobe noncalcified pulmonary nodule.  She has subsequently been evaluated by Dr. Elenor Quinones at [facility] with chief of thoracic surgery who she has served with on the cancer board at [facility].  Family history of heart issues and grandparents and her mother dying at 44 secondary to arrhythmia she now presents for establishment of cardiology care with me.  At present, she denies any chest pain PND orthopnea.  She denies any change in exercise tolerance.  She denies any  awareness of sleep apnea but she sleeps by herself and is not aware if she snores.  Past Medical History:    Diagnosis Date  . Depression   . Fatigue   . Hypercholesterolemia   . Hyperlipidemia   . Hypertension    controlled  . Insomnia   . Obesity   . Skin disorder     Past Surgical History:  Procedure Laterality Date  . APPENDECTOMY  1976  . ENDOMETRIAL FULGURATION  1992  . OOPHORECTOMY  1976    Current Medications: Outpatient Medications Prior to Visit  Medication Sig Dispense Refill  . aspirin 81 MG tablet Take 81 mg by mouth daily.      . calcium citrate-vitamin D 200-200 MG-UNIT TABS Take 2 tablets by mouth daily.     Marland Kitchen estradiol (VIVELLE-DOT) 0.025 MG/24HR Place 1 patch onto the skin 2 (two) times a week.      . Flaxseed, Linseed, (FLAXSEED OIL PO) Take 2 capsules by mouth daily. ON HOLD    . levothyroxine (SYNTHROID, LEVOTHROID) 25 MCG tablet Take 1 tablet (25 mcg total) by mouth daily. 30 tablet 1  . MedroxyPROGESTERone Acetate (PROVERA PO) Take by mouth. Days 1-12 of every month    . multivitamin (THERAGRAN) per tablet Take 1 tablet by mouth daily.      . Omega-3 Fatty Acids (FISH OIL) 1000 MG CAPS HOLD    . TURMERIC CURCUMIN PO Take 1,000 mg by mouth.    . Vitamin D, Ergocalciferol, (DRISDOL) 50000 units CAPS capsule Take 50,000 Units  by mouth every 7 (seven) days.    Marland Kitchen losartan (COZAAR) 100 MG tablet Take by mouth.    . benzonatate (TESSALON) 100 MG capsule Take 1 capsule by mouth Every 4 hours as needed.    . cephALEXin (KEFLEX) 500 MG capsule Take 1 capsule (500 mg total) by mouth 3 (three) times daily. (Patient not taking: Reported on 06/08/2014) 30 capsule 0  . Cholecalciferol (VITAMIN D PO) Take 1 tablet by mouth every 30 (thirty) days.      . citalopram (CELEXA) 20 MG tablet Take by mouth.    . Doxycycline Hyclate (DORYX PO) Take 1 capsule by mouth. 150 mg three times weekly    . fenofibrate 160 MG tablet   4  . glucosamine-chondroitin 500-400 MG tablet Take 2 tablets by mouth daily.     . magnesium oxide (MAG-OX) 400 MG tablet Take 400 mg by mouth daily.    Marland Kitchen  omeprazole (PRILOSEC) 20 MG capsule Take 1 capsule (20 mg total) by mouth daily. 30 capsule 1  . valsartan (DIOVAN) 80 MG tablet Take 80 mg by mouth daily.       No facility-administered medications prior to visit.      Allergies:   Patient has no known allergies.   Social History   Socioeconomic History  . Marital status: Divorced    Spouse name: Not on file  . Number of children: Not on file  . Years of education: Not on file  . Highest education level: Not on file  Occupational History  . Occupation: Astronomer: SELF-EMPLOYED  Social Needs  . Financial resource strain: Not on file  . Food insecurity:    Worry: Not on file    Inability: Not on file  . Transportation needs:    Medical: Not on file    Non-medical: Not on file  Tobacco Use  . Smoking status: Never Smoker  . Smokeless tobacco: Never Used  Substance and Sexual Activity  . Alcohol use: Yes    Comment: 2-3 glasses wine daily  . Drug use: Not on file  . Sexual activity: Not on file  Lifestyle  . Physical activity:    Days per week: Not on file    Minutes per session: Not on file  . Stress: Not on file  Relationships  . Social connections:    Talks on phone: Not on file    Gets together: Not on file    Attends religious service: Not on file    Active member of club or organization: Not on file    Attends meetings of clubs or organizations: Not on file    Relationship status: Not on file  Other Topics Concern  . Not on file  Social History Narrative  . Not on file    Additional social history is that she has been very active in the community.  She serves on the cancer board at [facility].  She had worked in [employer] for 12 years from [phone].  She has worked in Pharmacologist.  She was involved with politics had worked for the [employer].  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  Family  History:  The patient's family history includes Arrhythmia in her mother; Asthma in her brother, mother, and paternal aunt; Cancer in her brother and father; Emphysema in her mother.   Her father died at age 81 and was a physician.  He  had laryngeal CA.  Mother died at 74 secondary to arrhythmias.  Her brother died at age 62 secondary to ALL and T-cell lymphoma.  ROS General: Negative; No fevers, chills, or night sweats;  HEENT: Negative; No changes in vision or hearing, sinus congestion, difficulty swallowing Pulmonary: Negative; No cough, wheezing, shortness of breath, hemoptysis Cardiovascular: Negative; No chest pain, presyncope, syncope, palpitations GI: Negative; No nausea, vomiting, diarrhea, or abdominal pain GU: Negative; No dysuria, hematuria, or difficulty voiding Musculoskeletal: Negative; no myalgias, joint pain, or weakness Hematologic/Oncology: Negative; no easy bruising, bleeding Endocrine: Negative; no heat/cold intolerance; no diabetes Neuro: Negative; no changes in balance, headaches Skin: Negative; No rashes or skin lesions Psychiatric: Negative; No behavioral problems, depression Sleep: Negative; No snoring, daytime sleepiness, hypersomnolence, bruxism, restless legs, hypnogognic hallucinations, no cataplexy Other comprehensive 14 point system review is negative.   PHYSICAL EXAM:   VS:  BP (!) 162/82 (BP Location: Left Arm, Patient Position: Sitting)   Pulse 76   Ht 5' 4.5" (1.638 m)   Wt 188 lb 9.6 oz (85.5 kg)   BMI 31.87 kg/m     Repeat blood pressure by me was 170/88 supine and 172/84 standing.  Wt Readings from Last 3 Encounters:  08/31/17 188 lb 9.6 oz (85.5 kg)  10/16/10 192 lb 12.8 oz (87.5 kg)  10/03/10 194 lb 8 oz (88.2 kg)    General: Alert, oriented, no distress.  Skin: normal turgor, no rashes, warm and dry HEENT: Normocephalic, atraumatic. Pupils equal round and reactive to light; sclera anicteric; extraocular muscles intact; Fundi without  hemorrhages or exudates.  Discs flat. Nose without nasal septal hypertrophy Mouth/Parynx benign; Mallinpatti scale 3 Neck: No JVD, no carotid bruits; normal carotid upstroke Lungs: clear to ausculatation and percussion; no wheezing or rales Chest wall: without tenderness to palpitation Heart: PMI not displaced, RRR, s1 s2 normal, 1/6 systolic murmur, no diastolic murmur, no rubs, gallops, thrills, or heaves Abdomen: soft, nontender; no hepatosplenomehaly, BS+; abdominal aorta nontender and not dilated by palpation. Back: no CVA tenderness Pulses 2+; no bruits. Musculoskeletal: full range of motion, normal strength, no joint deformities Extremities: no clubbing cyanosis or edema, Homan's sign negative  Neurologic: grossly nonfocal; Cranial nerves grossly wnl Psychologic: Normal mood and affect   Studies/Labs Reviewed:   EKG:  EKG is  ordered today.  ECG (independently read by me): Sinus rhythm at 76 bpm.  Right superior axis.  Normal intervals.  No ectopy.  No ST segment changes.  Recent Labs: BMP Latest Ref Rng & Units 06/21/2010 01/05/2009  Glucose 70 - 99 mg/dL 131(H) 106(H)  BUN 6 - 23 mg/dL 9 8  Creatinine 0.4 - 1.2 mg/dL 0.5 0.50  Sodium 135 - 145 mEq/L 137 138  Potassium 3.5 - 5.1 mEq/L 4.1 4.3  Chloride 96 - 112 mEq/L 104 105  CO2 19 - 32 mEq/L 26 28  Calcium 8.4 - 10.5 mg/dL 8.8 9.5     Hepatic Function Latest Ref Rng & Units 06/21/2010  Total Protein 6.0 - 8.3 g/dL 6.5  Albumin 3.5 - 5.2 g/dL 4.0  AST 0 - 37 U/L 27  ALT 0 - 35 U/L 35  Alk Phosphatase 39 - 117 U/L 75  Total Bilirubin 0.3 - 1.2 mg/dL 0.5  Bilirubin, Direct 0.0 - 0.3 mg/dL 0.1    CBC Latest Ref Rng & Units 06/21/2010 01/09/2009  WBC 4.5 - 10.5 K/uL 3.9(L) -  Hemoglobin 12.0 - 15.0 g/dL 13.3 13.6  Hematocrit 36.0 - 46.0 % 38.3 -  Platelets 150.0 -  400.0 K/uL 221.0 -   Lab Results  Component Value Date   MCV 90.5 06/21/2010   Lab Results  Component Value Date   TSH 2.60 06/21/2010   No  results found for: HGBA1C   BNP No results found for: BNP  ProBNP No results found for: PROBNP   Lipid Panel     Component Value Date/Time   CHOL 190 06/21/2010 0811   TRIG 182.0 (H) 06/21/2010 0811   HDL 44.10 06/21/2010 0811   CHOLHDL 4 06/21/2010 0811   VLDL 36.4 06/21/2010 0811   LDLCALC 110 (H) 06/21/2010 0811     RADIOLOGY: No results found.   Additional studies/ records that were reviewed today include:  I reviewed her remote record from Dr. Doreatha Lew in 2012.  I also reviewed her initial consultation at [facility] on July 28, 2017 by Dr. Elenor Quinones.  Reviewed her CT calcium score.  ASSESSMENT:    1. Hypertension, unspecified type   2. Medication management   3. Mixed hyperlipidemia   4. Acquired hypothyroidism   5. Pulmonary nodule, right     PLAN:  Ms. Megan Savage is a very pleasant 62 year old female who has a history of hypertension dating back to at least 2012 at which time she believes she was started on medical therapy with valsartan.  Approximately 4 years ago she had lost 36 pounds and apparently her blood pressure improved and she was taken off medication.  She has gained some of the weight back in February 2019 she was restarted back on ARB therapy.  Most recently she has been titrated up to losartan 100 mg daily.  However, her blood pressures have continued to be elevated.  When she was recently seen at [facility] during that evaluation her blood pressure was reported at 165/84.  Her blood pressure today by me was 170/88 supine and was without orthostatic change at 172/84 standing.  I have suggested substituting losartan for olmesartan 40 mg which should be somewhat more potent.  I also recommended the addition of spironolactone 12.5 mg daily.  I am scheduling her for an echo Doppler study to assess both systolic and diastolic function in addition to valvular architecture.  I will also check renal duplex imaging to make certain there is no renovascular etiology to her  recent increasing blood pressure.  She will undergo a follow-up bmet in several weeks with this medication adjustment to make certain renal function and potassium remain stable.  I had a long discussion with her today regarding potential subclinical atherosclerosis even though a calcium score is 0.  Her most recent lipid studies were reviewed which showed a total cholesterol of 206 triglycerides were elevated at 271 HDL was low at 34 and LDL was 118.  Her lipid panel is associated with an atherogenic dyslipidemic pattern and if advanced testing were performed I suspect she may have an increased preponderance of small dense LDLs.  I reviewed data regarding clinical arthrosis as well as data regarding potential regression of coronary obstructive disease.  At present, she is not on lipid-lowering therapy.  We discussed diet and elevated triglycerides.  She will be having follow-up laboratory with significant dietary adjustment hopefully lipid studies will be improved.  I will see her in 2 months for reevaluation.   Medication Adjustments/Labs and Tests Ordered: Current medicines are reviewed at length with the patient today.  Concerns regarding medicines are outlined above.  Medication changes, Labs and Tests ordered today are listed in the Patient Instructions below. Patient Instructions  Medication Instructions:  STOP Losartan START olmesartan 40 mg daily  After one week: Start spironolactone 12.5 mg (1/2 tablet) daily  Labwork: Please return for labs in ~2 weeks (after being on both new medications for 1 week) (BMET)  Our in office lab hours are Monday-Friday 8:00-4:00, closed for lunch 12:45-1:45 pm.  No appointment needed.  Testing/Procedures: Your physician has requested that you have an echocardiogram. Echocardiography is a painless test that uses sound waves to create images of your heart. It provides your doctor with information about the size and shape of your heart and how well your  heart's chambers and valves are working. This procedure takes approximately one hour. There are no restrictions for this procedure.  This will be done at our [facility] location:  [place] has requested that you have a renal artery duplex. During this test, an ultrasound is used to evaluate blood flow to the kidneys. Allow one hour for this exam. Do not eat after midnight the day before and avoid carbonated beverages. Take your medications as you usually do.  Follow-Up: 2 months with Dr. Claiborne Billings  Any Other Special Instructions Will Be Listed Below (If Applicable).     If you need a refill on your cardiac medications before your next appointment, please call your pharmacy.      Signed, Shelva Majestic, MD  09/06/2017 1:19 PM    [facility] [address] [place], [place], [place]  [address] Phone: [phone]

## 2017-08-31 NOTE — Patient Instructions (Signed)
Medication Instructions:  STOP Losartan START olmesartan 40 mg daily  After one week: Start spironolactone 12.5 mg (1/2 tablet) daily  Labwork: Please return for labs in ~2 weeks (after being on both new medications for 1 week) (BMET)  Our in office lab hours are Monday-Friday 8:00-4:00, closed for lunch 12:45-1:45 pm.  No appointment needed.  Testing/Procedures: Your physician has requested that you have an echocardiogram. Echocardiography is a painless test that uses sound waves to create images of your heart. It provides your doctor with information about the size and shape of your heart and how well your heart's chambers and valves are working. This procedure takes approximately one hour. There are no restrictions for this procedure.  This will be done at our [facility] location:  [place] has requested that you have a renal artery duplex. During this test, an ultrasound is used to evaluate blood flow to the kidneys. Allow one hour for this exam. Do not eat after midnight the day before and avoid carbonated beverages. Take your medications as you usually do.  Follow-Up: 2 months with Dr. Claiborne Billings  Any Other Special Instructions Will Be Listed Below (If Applicable).     If you need a refill on your cardiac medications before your next appointment, please call your pharmacy.

## 2017-09-03 ENCOUNTER — Ambulatory Visit (HOSPITAL_COMMUNITY): Payer: BLUE CROSS/BLUE SHIELD | Attending: Ophthalmology

## 2017-09-03 ENCOUNTER — Other Ambulatory Visit: Payer: Self-pay

## 2017-09-03 DIAGNOSIS — Z6831 Body mass index (BMI) 31.0-31.9, adult: Secondary | ICD-10-CM | POA: Diagnosis not present

## 2017-09-03 DIAGNOSIS — E669 Obesity, unspecified: Secondary | ICD-10-CM | POA: Diagnosis not present

## 2017-09-03 DIAGNOSIS — I351 Nonrheumatic aortic (valve) insufficiency: Secondary | ICD-10-CM | POA: Insufficient documentation

## 2017-09-03 DIAGNOSIS — E785 Hyperlipidemia, unspecified: Secondary | ICD-10-CM | POA: Insufficient documentation

## 2017-09-03 DIAGNOSIS — I10 Essential (primary) hypertension: Secondary | ICD-10-CM

## 2017-09-03 DIAGNOSIS — F329 Major depressive disorder, single episode, unspecified: Secondary | ICD-10-CM | POA: Diagnosis not present

## 2017-09-06 ENCOUNTER — Encounter: Payer: Self-pay | Admitting: Cardiovascular Disease

## 2017-09-07 ENCOUNTER — Ambulatory Visit (HOSPITAL_COMMUNITY)
Admission: RE | Admit: 2017-09-07 | Discharge: 2017-09-07 | Disposition: A | Discharge status: Home / Self Care | Payer: BLUE CROSS/BLUE SHIELD | Source: Ambulatory Visit | Attending: Cardiology | Admitting: Cardiology

## 2017-09-07 DIAGNOSIS — I10 Essential (primary) hypertension: Secondary | ICD-10-CM

## 2017-09-10 ENCOUNTER — Telehealth: Payer: Self-pay | Admitting: Cardiovascular Disease

## 2017-09-10 NOTE — Telephone Encounter (Signed)
Pt aware of echo and ultrasound on kidneys./cy

## 2017-09-10 NOTE — Telephone Encounter (Signed)
New Message ° ° ° °Patient is returning call in reference to echocardiogram results. Please call.  °

## 2017-09-15 LAB — BASIC METABOLIC PANEL
BUN/Creatinine Ratio: 31 — ABNORMAL HIGH (ref 12–28)
BUN: 18 mg/dL (ref 8–27)
CO2: 21 mmol/L (ref 20–29)
Calcium: 9.6 mg/dL (ref 8.7–10.3)
Chloride: 101 mmol/L (ref 96–106)
Creatinine, Ser: 0.58 mg/dL (ref 0.57–1.00)
GFR calc Af Amer: 113 mL/min/{1.73_m2} (ref >59)
GFR calc non Af Amer: 98 mL/min/{1.73_m2} (ref >59)
Glucose: 150 mg/dL — ABNORMAL HIGH (ref 65–99)
Potassium: 4.7 mmol/L (ref 3.5–5.2)
Sodium: 137 mmol/L (ref 134–144)

## 2017-11-02 ENCOUNTER — Ambulatory Visit: Payer: BLUE CROSS/BLUE SHIELD | Admitting: Cardiovascular Disease

## 2017-11-10 ENCOUNTER — Ambulatory Visit: Payer: BLUE CROSS/BLUE SHIELD | Admitting: Cardiovascular Disease

## 2017-11-10 ENCOUNTER — Encounter: Payer: Self-pay | Admitting: Cardiovascular Disease

## 2017-11-10 VITALS — BP 146/76 | HR 84 | Ht 64.5 in | Wt 184.2 lb

## 2017-11-10 DIAGNOSIS — E782 Mixed hyperlipidemia: Secondary | ICD-10-CM | POA: Diagnosis not present

## 2017-11-10 DIAGNOSIS — I10 Essential (primary) hypertension: Secondary | ICD-10-CM | POA: Diagnosis not present

## 2017-11-10 DIAGNOSIS — Z79899 Other long term (current) drug therapy: Secondary | ICD-10-CM | POA: Diagnosis not present

## 2017-11-10 DIAGNOSIS — E039 Hypothyroidism, unspecified: Secondary | ICD-10-CM

## 2017-11-10 DIAGNOSIS — R911 Solitary pulmonary nodule: Secondary | ICD-10-CM | POA: Diagnosis not present

## 2017-11-10 MED ORDER — ROSUVASTATIN CALCIUM 10 MG PO TABS: 10.0000 mg | ORAL_TABLET | Freq: Every day | ORAL | 3 refills | Status: DC

## 2017-11-10 NOTE — Progress Notes (Signed)
Cardiology Office Note    Date:  11/12/2017   ID:  Megan Savage, DOB [birth year], MRN [id number]  PCP:  Shon Baton, MD  Cardiologist:  Shelva Majestic, MD   Follow-up office visit  History of Present Illness:  Megan Savage is a 62 y.o. female who is followed by Dr. Shon Baton for primary care.  I saw her for initial evaluation in August 2019 when she was self-referred for difficult control hypertension and cardiovascular risk assessment.  She presents for 26-monthfollow-up evaluation.  .  Ms.Megan Luhrsis known to me.  She remotely had been seen by Dr. TDoreatha Lewin 2012 and at that time had mild hypertension, hypohyroidism, obesity, and hyperlipidemia.  She was on valsartan 80 mg, levothyroxine 25 mcg, and omega-3 fatty acid.  She lost a significant amount of weight approximately 4 years ago and it and with a 36 pound weight loss her blood pressure improved and apparently she was taken off medication.  She had been started back on blood pressure medication in February 2019 and due to the valsartan recall was initially started on losartan.  She is now on 100 mg daily.  Bite this, she has noticed that her blood pressures have been consistently elevated.  She has regained some of her weight back.  On July 17, 2017 undergone a CT calcium score was 0.  She was incidentally found to have a right lower lobe noncalcified pulmonary nodule.  [facility]  She has a family history of heart issues and grandparents and her mother dying at 644secondary to arrhythmia.  When I initially saw her she denied any chest pain, PND orthopnea.  She denied any awareness of sleep apnea but sleeps by herself and was unaware if she snores.   When I initially saw her, she was significantly hypertensive with blood pressure 170/88 without orthostatic change.  At that time, I recommended discontinuing  losartan and in its place substituted this for olmesartan 40 mg which should be more potent.  I also recommended the addition of Spironolactone 12.5 mg.  She underwent an echo Doppler study which demonstrated mild concentric LVH with normal EF at 60 to 65% without regional wall motion abnormalities.  There was grade 1 diastolic dysfunction and tissue Doppler was consistent with high ventricular filling pressures.  There was mild left atrial dilation.  Because of her somewhat accelerated hypertension I recommended a renal artery duplex evaluation.  This demonstrated mild to less than 59% bilateral narrowing of the right and left renal arteries with abnormal resistive index.  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  She denies any abdominal symptoms.  When I saw her I reviewed data with reference to the clinical atherosclerosis.  We discussed diet and her laboratory in May which had shown a total cholesterol of 206, HDL 34, LDL 118, and triglycerides of 271.   Since I saw her, she states her blood pressure has been markedly improved on her increased medical regimen.  She feels well.  She has more energy.  She presents  for follow-up evaluation.   Past Medical History:  Diagnosis Date  . Depression   . Fatigue   . Hypercholesterolemia   . Hyperlipidemia   . Hypertension    controlled  . Insomnia   . Obesity   .  Skin disorder     Past Surgical History:  Procedure Laterality Date  . APPENDECTOMY  1976  . ENDOMETRIAL FULGURATION  1992  . OOPHORECTOMY  1976    Current Medications: Outpatient Medications Prior to Visit  Medication Sig Dispense Refill  . aspirin 81 MG tablet Take 81 mg by mouth daily.      . calcium citrate-vitamin D 200-200 MG-UNIT TABS Take 2 tablets by mouth daily.     Marland Kitchen estradiol (VIVELLE-DOT) 0.025 MG/24HR Place 1 patch onto the skin 2 (two) times a week.      . Flaxseed, Linseed, (FLAXSEED OIL PO) Take 2 capsules by mouth  daily. ON HOLD    . levothyroxine (SYNTHROID, LEVOTHROID) 25 MCG tablet Take 1 tablet (25 mcg total) by mouth daily. 30 tablet 1  . MedroxyPROGESTERone Acetate (PROVERA PO) Take by mouth. Days 1-10 of every month    . multivitamin (THERAGRAN) per tablet Take 1 tablet by mouth daily.      Marland Kitchen olmesartan (BENICAR) 40 MG tablet Take 1 tablet (40 mg total) by mouth daily. 30 tablet 3  . Omega-3 Fatty Acids (FISH OIL) 1000 MG CAPS HOLD (Patient taking differently: Take 1 capsule by mouth daily. HOLD)    . spironolactone (ALDACTONE) 25 MG tablet Take 0.5 tablets (12.5 mg total) by mouth daily. 15 tablet 3  . TURMERIC CURCUMIN PO Take 1,000 mg by mouth.    . Vitamin D, Ergocalciferol, (DRISDOL) 50000 units CAPS capsule Take 50,000 Units by mouth every 7 (seven) days.     No facility-administered medications prior to visit.      Allergies:   Patient has no known allergies.   Social History   Socioeconomic History  . Marital status: Divorced    Spouse name: Not on file  . Number of children: Not on file  . Years of education: Not on file  . Highest education level: Not on file  Occupational History  . Occupation: Astronomer: SELF-EMPLOYED  Social Needs  . Financial resource strain: Not on file  . Food insecurity:    Worry: Not on file    Inability: Not on file  . Transportation needs:    Medical: Not on file    Non-medical: Not on file  Tobacco Use  . Smoking status: Never Smoker  . Smokeless tobacco: Never Used  Substance and Sexual Activity  . Alcohol use: Yes    Comment: 2-3 glasses wine daily  . Drug use: Not on file  . Sexual activity: Not on file  Lifestyle  . Physical activity:    Days per week: Not on file    Minutes per session: Not on file  . Stress: Not on file  Relationships  . Social connections:    Talks on phone: Not on file    Gets together: Not on file    Attends religious service: Not on file    Active member of club or organization:  Not on file    Attends meetings of clubs or organizations: Not on file    Relationship status: Not on file  Other Topics Concern  . Not on file  Social History Narrative  . Not on file    Additional social history is that she has been very active in the community.  She serves on the cancer board at [facility].  She had worked in [employer] for 12 years from [phone].  She has worked in Pharmacologist.  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  Family History:  The patient's family history includes Arrhythmia in her mother; Asthma in her brother, mother, and paternal aunt; Cancer in her brother and father; Emphysema in her mother.   Her father died at age 19 and was a physician.  He had laryngeal CA.  Mother died at 64 secondary to arrhythmias.  Her brother died at age 46 secondary to ALL and T-cell lymphoma.  ROS General: Negative; No fevers, chills, or night sweats;  HEENT: Negative; No changes in vision or hearing, sinus congestion, difficulty swallowing Pulmonary: Negative; No cough, wheezing, shortness of breath, hemoptysis Cardiovascular: Negative; No chest pain, presyncope, syncope, palpitations GI: Negative; No nausea, vomiting, diarrhea, or abdominal pain GU: Negative; No dysuria, hematuria, or difficulty voiding Musculoskeletal: Negative; no myalgias, joint pain, or weakness Hematologic/Oncology: Negative; no easy bruising, bleeding Endocrine: Negative; no heat/cold intolerance; no diabetes Neuro: Negative; no changes in balance, headaches Skin: Negative; No rashes or skin lesions Psychiatric: Negative; No behavioral problems, depression Sleep: Negative; No snoring, daytime sleepiness, hypersomnolence, bruxism, restless legs, hypnogognic hallucinations, no cataplexy Other comprehensive 14 point system review is  negative.   PHYSICAL EXAM:   VS:  BP (!) 146/76 (BP Location: Left Arm, Patient Position: Sitting, Cuff Size: Normal)   Pulse 84   Ht 5' 4.5" (1.638 m)   Wt 184 lb 3.2 oz (83.6 kg)   BMI 31.13 kg/m     Repeat blood pressure by me was significantly improved from her prior examination and now was normal at 124/74  Wt Readings from Last 3 Encounters:  11/10/17 184 lb 3.2 oz (83.6 kg)  08/31/17 188 lb 9.6 oz (85.5 kg)  10/16/10 192 lb 12.8 oz (87.5 kg)    General: Alert, oriented, no distress.  Skin: normal turgor, no rashes, warm and dry HEENT: Normocephalic, atraumatic. Pupils equal round and reactive to light; sclera anicteric; extraocular muscles intact;  Nose without nasal septal hypertrophy Mouth/Parynx benign; Mallinpatti scale 3 Neck: No JVD, no carotid bruits; normal carotid upstroke Lungs: clear to ausculatation and percussion; no wheezing or rales Chest wall: without tenderness to palpitation Heart: PMI not displaced, RRR, s1 s2 normal, 1/6 systolic murmur, no diastolic murmur, no rubs, gallops, thrills, or heaves Abdomen: soft, nontender; no hepatosplenomehaly, BS+; abdominal aorta nontender and not dilated by palpation. Back: no CVA tenderness Pulses 2+ Musculoskeletal: full range of motion, normal strength, no joint deformities Extremities: no clubbing cyanosis or edema, Homan's sign negative  Neurologic: grossly nonfocal; Cranial nerves grossly wnl Psychologic: Normal mood and affect   Studies/Labs Reviewed:   EKG:  EKG is  ordered today.  ECG (independently read by me): Sinus rhythm 84 bpm.  Borderline first-degree AV block with a PR interval 206 ms.  Mild left axis deviation.  No ectopy.  August 31, 2017 ECG (independently read by me): Sinus rhythm at 76 bpm.  Right superior axis.  Normal intervals.  No ectopy.  No ST segment changes.  Recent Labs: BMP Latest Ref Rng & Units 09/15/2017 06/21/2010 01/05/2009  Glucose 65 - 99 mg/dL 150(H) 131(H) 106(H)  BUN 8 - 27  mg/dL _0 Creatinine 0.57 - 1.00 mg/dL 0.58 0.5 0.50  BUN/Creat Ratio 12 - 28 31(H) - -  Sodium 134 - 144 mmol/L 137 137 138  Potassium 3.5 - 5.2 mmol/L 4.7 4.1 4.3  Chloride 96 - 106 mmol/L 101 104 105  CO2 20 - 29 mmol/L _0 Calcium 8.7 - 10.3 mg/dL 9.6 8.8 9.5     Hepatic Function Latest Ref Rng & Units 06/21/2010  Total Protein 6.0 - 8.3 g/dL 6.5  Albumin 3.5 - 5.2 g/dL 4.0  AST 0 - 37 U/L 27  ALT 0 - 35 U/L 35  Alk Phosphatase 39 - 117 U/L 75  Total Bilirubin 0.3 - 1.2 mg/dL 0.5  Bilirubin, Direct 0.0 - 0.3 mg/dL 0.1    CBC Latest Ref Rng & Units 06/21/2010 01/09/2009  WBC 4.5 - 10.5 K/uL 3.9(L) -  Hemoglobin 12.0 - 15.0 g/dL 13.3 13.6  Hematocrit 36.0 - 46.0 % 38.3 -  Platelets 150.0 - 400.0 K/uL 221.0 -   Lab Results  Component Value Date   MCV 90.5 06/21/2010   Lab Results  Component Value Date   TSH 2.60 06/21/2010   No results found for: HGBA1C   BNP No results found for: BNP  ProBNP No results found for: PROBNP   Lipid Panel     Component Value Date/Time   CHOL 190 06/21/2010 0811   TRIG 182.0 (H) 06/21/2010 0811   HDL 44.10 06/21/2010 0811   CHOLHDL 4 06/21/2010 0811   VLDL 36.4 06/21/2010 0811   LDLCALC 110 (H) 06/21/2010 3570     RADIOLOGY: No results found.   Additional studies/ records that were reviewed today include:  I reviewed her remote record from Dr. Doreatha Lew in 2012.  I also reviewed her initial consultation at [facility] on July 28, 2017 by Dr. Elenor Quinones.  Reviewed her CT calcium score; echo Doppler study, renal duplex scan.  ASSESSMENT:    1. Essential hypertension   2. Mixed hyperlipidemia   3. Medication management   4. Pulmonary nodule, right   5. Acquired hypothyroidism     PLAN:  Ms. Aveline Daus is a very pleasant 62 year old female who has a history of hypertension dating back to at least 2012 at which time she believes she was started on medical therapy with valsartan.  She had lost weight with improvement  in blood pressure and as result had stopped medical therapy for several years.  When I initially saw her for cardiology evaluation in August 2019 she was back on losartan but had significant blood pressure elevation.  With her change in medication to olmesartan 40 mg and spironolactone 12.5 mg, her blood pressure today is well controlled.  I reviewed her echo Doppler study which shows normal systolic function with grade 1 diastolic dysfunction.  Her renal duplex scan did demonstrate 1-59% narrowing in the right and left renal arteries.  She had normal cortical thickness of both kidneys.  There was an incidental finding of greater than 70% stenosis in the celiac artery with a normal superior mesenteric artery.  Her coronary calcium score was 0. With her dyslipidemia, I have suggested initiation of low-dose statin therapy.  She will commence Crestor 10 mg every other day for 2 weeks and if tolerated we will increase this to 10 mg daily.  I have recommended follow-up laboratory be obtained in several months.  He continues to be on low-dose levothyroxine for mild hypothyroidism.  I will see her in 6 months for reevaluation.  Time spent: 25 minutes  Medication Adjustments/Labs and Tests Ordered: Current medicines are reviewed at length with the patient today.  Concerns regarding medicines are outlined above.  Medication changes, Labs and Tests ordered today are listed in the Patient Instructions below. Patient Instructions  Medication Instructions:  START rosuvastatin (Crestor) 10 mg  --take 1 tablet every other day for 2 weeks, then if you are tolerating okay increase to daily  If you need a refill on your cardiac medications before your next appointment, please call your pharmacy.   Lab work: Please return for FASTING labs in 2-3 months (CMET,Lipid)  Our in office lab hours are Monday-Friday 8:00-4:00, closed for lunch 12:45-1:45 pm.  No appointment needed.  If you have labs (blood work) drawn today  and your tests are completely normal, you will receive your results only by: Marland Kitchen MyChart Message (if you have MyChart) OR . A paper copy in the mail If you have any lab test that is abnormal or we need to change your treatment, we will call you to review the results.  Follow-Up: At [facility], you and your health needs are our priority.  As part of our continuing mission to provide you with exceptional heart care, we have created designated Provider Care Teams.  These Care Teams include your primary Cardiologist (physician) and Advanced Practice Providers (APPs -  Physician Assistants and Nurse Practitioners) who all work together to provide you with the care you need, when you need it. You will need a follow up appointment in 6 months.  Please call our office 2 months in advance to schedule this appointment.  You may see Dr. Claiborne Billings or one of the following Advanced Practice Providers on your designated Care Team: [place], [place] . Fabian Sharp, PA-C         Signed, Shelva Majestic, MD  11/12/2017 9:26 PM    [facility] [address] [place], [place], [place]  [address] Phone: [phone]

## 2017-11-10 NOTE — Patient Instructions (Signed)
Medication Instructions:  START rosuvastatin (Crestor) 10 mg  --take 1 tablet every other day for 2 weeks, then if you are tolerating okay increase to daily  If you need a refill on your cardiac medications before your next appointment, please call your pharmacy.   Lab work: Please return for FASTING labs in 2-3 months (CMET,Lipid)  Our in office lab hours are Monday-Friday 8:00-4:00, closed for lunch 12:45-1:45 pm.  No appointment needed.  If you have labs (blood work) drawn today and your tests are completely normal, you will receive your results only by: Marland Kitchen MyChart Message (if you have MyChart) OR . A paper copy in the mail If you have any lab test that is abnormal or we need to change your treatment, we will call you to review the results.  Follow-Up: At [facility], you and your health needs are our priority.  As part of our continuing mission to provide you with exceptional heart care, we have created designated Provider Care Teams.  These Care Teams include your primary Cardiologist (physician) and Advanced Practice Providers (APPs -  Physician Assistants and Nurse Practitioners) who all work together to provide you with the care you need, when you need it. You will need a follow up appointment in 6 months.  Please call our office 2 months in advance to schedule this appointment.  You may see Dr. Claiborne Billings or one of the following Advanced Practice Providers on your designated Care Team: [place], [place] . Fabian Sharp, PA-C

## 2017-11-12 ENCOUNTER — Encounter: Payer: Self-pay | Admitting: Cardiovascular Disease

## 2017-12-21 ENCOUNTER — Other Ambulatory Visit: Payer: Self-pay | Admitting: Internal Medicine

## 2017-12-21 ENCOUNTER — Telehealth: Payer: Self-pay | Admitting: Cardiovascular Disease

## 2017-12-21 DIAGNOSIS — Z1231 Encounter for screening mammogram for malignant neoplasm of breast: Secondary | ICD-10-CM

## 2017-12-21 NOTE — Telephone Encounter (Signed)
Forwarded to Dr. Claiborne Billings to advise

## 2017-12-21 NOTE — Telephone Encounter (Signed)
Try reducing Crestor to 1 or 2 times per week as tolerated.  If she continues to have difficulty then may need to discontinue and initiate a trial of Zetia 10 mg

## 2017-12-21 NOTE — Telephone Encounter (Signed)
New message   Pt c/o medication issue:  1. Name of Medication: rosuvastatin (CRESTOR) 10 MG tablet  2. How are you currently taking this medication (dosage and times per day)? 1 time daily  3. Are you having a reaction (difficulty breathing--STAT)? No   4. What is your medication issue?patient states that she is having inflamation from arthritis from taking this medication. Please advise.

## 2017-12-22 NOTE — Telephone Encounter (Signed)
See My chart message

## 2017-12-28 ENCOUNTER — Other Ambulatory Visit: Payer: Self-pay | Admitting: Cardiovascular Disease

## 2018-01-14 ENCOUNTER — Other Ambulatory Visit: Payer: Self-pay | Admitting: *Deleted

## 2018-01-14 MED ORDER — EZETIMIBE 10 MG PO TABS: 10.0000 mg | ORAL_TABLET | Freq: Every day | ORAL | 3 refills | Status: DC

## 2018-01-21 ENCOUNTER — Other Ambulatory Visit: Payer: Self-pay | Admitting: Internal Medicine

## 2018-01-21 DIAGNOSIS — R911 Solitary pulmonary nodule: Secondary | ICD-10-CM

## 2018-01-21 DIAGNOSIS — M25562 Pain in left knee: Secondary | ICD-10-CM | POA: Insufficient documentation

## 2018-03-05 ENCOUNTER — Ambulatory Visit
Admission: RE | Admit: 2018-03-05 | Discharge: 2018-03-05 | Disposition: A | Discharge status: Home / Self Care | Payer: BLUE CROSS/BLUE SHIELD | Source: Ambulatory Visit | Attending: Internal Medicine | Admitting: Internal Medicine

## 2018-03-05 DIAGNOSIS — Z1231 Encounter for screening mammogram for malignant neoplasm of breast: Secondary | ICD-10-CM

## 2018-03-07 DIAGNOSIS — M19019 Primary osteoarthritis, unspecified shoulder: Secondary | ICD-10-CM | POA: Insufficient documentation

## 2018-03-07 DIAGNOSIS — M1712 Unilateral primary osteoarthritis, left knee: Secondary | ICD-10-CM | POA: Insufficient documentation

## 2018-04-28 ENCOUNTER — Other Ambulatory Visit: Payer: Self-pay | Admitting: Cardiovascular Disease

## 2018-04-28 NOTE — Telephone Encounter (Signed)
Spironolactone and benicar refilled.

## 2018-05-12 LAB — COMPREHENSIVE METABOLIC PANEL
ALT: 31 IU/L (ref 0–32)
AST: 22 IU/L (ref 0–40)
Albumin/Globulin Ratio: 1.9 (ref 1.2–2.2)
Albumin: 4.6 g/dL (ref 3.8–4.8)
Alkaline Phosphatase: 84 IU/L (ref 39–117)
BUN/Creatinine Ratio: 23 (ref 12–28)
BUN: 13 mg/dL (ref 8–27)
Bilirubin Total: 0.3 mg/dL (ref 0.0–1.2)
CO2: 21 mmol/L (ref 20–29)
Calcium: 9.7 mg/dL (ref 8.7–10.3)
Chloride: 102 mmol/L (ref 96–106)
Creatinine, Ser: 0.57 mg/dL (ref 0.57–1.00)
GFR calc Af Amer: 113 mL/min/{1.73_m2} (ref >59)
GFR calc non Af Amer: 98 mL/min/{1.73_m2} (ref >59)
Globulin, Total: 2.4 g/dL (ref 1.5–4.5)
Glucose: 137 mg/dL — ABNORMAL HIGH (ref 65–99)
Potassium: 5 mmol/L (ref 3.5–5.2)
Sodium: 141 mmol/L (ref 134–144)
Total Protein: 7 g/dL (ref 6.0–8.5)

## 2018-05-12 LAB — LIPID PANEL
Chol/HDL Ratio: 8.4 ratio — ABNORMAL HIGH (ref 0.0–4.4)
Cholesterol, Total: 227 mg/dL — ABNORMAL HIGH (ref 100–199)
HDL: 27 mg/dL — ABNORMAL LOW (ref >39)
Triglycerides: 914 mg/dL (ref 0–149)

## 2018-05-17 DIAGNOSIS — M25511 Pain in right shoulder: Secondary | ICD-10-CM | POA: Insufficient documentation

## 2018-05-21 ENCOUNTER — Telehealth: Payer: Self-pay | Admitting: Cardiovascular Disease

## 2018-05-21 NOTE — Telephone Encounter (Signed)
LMTCB to change visit to virtual. Also saw that pt reviewed message sent to her yesterday and asked her to either respond to message or call back.

## 2018-05-24 ENCOUNTER — Telehealth: Payer: Self-pay | Admitting: Cardiovascular Disease

## 2018-05-24 ENCOUNTER — Telehealth (INDEPENDENT_AMBULATORY_CARE_PROVIDER_SITE_OTHER): Payer: BLUE CROSS/BLUE SHIELD | Admitting: Cardiovascular Disease

## 2018-05-24 ENCOUNTER — Encounter: Payer: Self-pay | Admitting: Cardiovascular Disease

## 2018-05-24 VITALS — BP 150/76 | HR 89 | Ht 64.5 in | Wt 175.0 lb

## 2018-05-24 DIAGNOSIS — M7061 Trochanteric bursitis, right hip: Secondary | ICD-10-CM | POA: Insufficient documentation

## 2018-05-24 DIAGNOSIS — E039 Hypothyroidism, unspecified: Secondary | ICD-10-CM

## 2018-05-24 DIAGNOSIS — E782 Mixed hyperlipidemia: Secondary | ICD-10-CM

## 2018-05-24 DIAGNOSIS — Z79899 Other long term (current) drug therapy: Secondary | ICD-10-CM | POA: Diagnosis not present

## 2018-05-24 DIAGNOSIS — I10 Essential (primary) hypertension: Secondary | ICD-10-CM

## 2018-05-24 DIAGNOSIS — R911 Solitary pulmonary nodule: Secondary | ICD-10-CM

## 2018-05-24 MED ORDER — ICOSAPENT ETHYL 1 G PO CAPS: 2.0000 g | ORAL_CAPSULE | Freq: Two times a day (BID) | ORAL | 2 refills | Status: DC

## 2018-05-24 MED ORDER — FENOFIBRATE 145 MG PO TABS: 145.0000 mg | ORAL_TABLET | Freq: Every day | ORAL | 1 refills | Status: DC

## 2018-05-24 NOTE — Telephone Encounter (Signed)
New Message   Patient returning phone call to get setup for virtual visit.

## 2018-05-24 NOTE — Telephone Encounter (Signed)
Will send message to [facility] who is scheduling for Dr Claiborne Billings

## 2018-05-24 NOTE — Progress Notes (Signed)
Virtual Visit via Video Note   This visit type was conducted due to national recommendations for restrictions regarding the COVID-19 Pandemic (e.g. social distancing) in an effort to limit this patient's exposure and mitigate transmission in our community.  Due to her co-morbid illnesses, this patient is at least at moderate risk for complications without adequate follow up.  This format is felt to be most appropriate for this patient at this time.  All issues noted in this document were discussed and addressed.  A limited physical exam was performed with this format.  Please refer to the patient's chart for her consent to telehealth for [facility].   Evaluation Performed:  Follow-up visit  Date:  05/24/2018   ID:  Megan Savage, DOB 1955-03-18, MRN [id number]  Patient Location: Home Provider Location: Home  PCP:  Shon Baton, MD  Cardiologist:  Shelva Majestic, MD Electrophysiologist:  None   Chief Complaint: 65-month follow-up cardiology evaluation  History of Present Illness:    Megan Savage is a 63 y.o. female who I saw for initial evaluation in August 2019 when she was self-referred for difficult control hypertension and cardiovascular risk assessment.  She remotely had been seen by Dr. Doreatha Lew in 2012 and at that time had mild hypertension, hypohyroidism, obesity, and hyperlipidemia.  She was on valsartan 80 mg, levothyroxine 25 mcg, and omega-3 fatty acid.  She lost a significant amount of weight approximately 4 years ago and it and with a 36 pound weight loss her blood pressure improved and apparently she was taken off medication.  She had been started back on blood pressure medication in February 2019 and due to the valsartan recall was initially started on losartan.  She is now on 100 mg daily.  Bite this, she has noticed that her blood pressures have been consistently elevated.  She has regained some of her weight back.  On July 17, 2017 undergone a CT calcium score was 0.  She  was incidentally found to have a right lower lobe noncalcified pulmonary nodule.  She has subsequently been evaluated by Dr. Elenor Quinones at [facility] with chief of thoracic surgery who she has served with on the cancer board at [facility].  She has a family history of heart issues and grandparents and her mother dying at 81 secondary to arrhythmia.  When I initially saw her she denied any chest pain, PND orthopnea.  She denied any awareness of sleep apnea but sleeps by herself and was unaware if she snores.   When I initially saw her, she was significantly hypertensive with blood pressure 170/88 without orthostatic change.  At that time, I recommended discontinuing losartan and in its place substituted this for olmesartan 40 mg which should be more potent.  I also recommended the addition of Spironolactone 12.5 mg.  She underwent an echo Doppler study which demonstrated mild concentric LVH with normal EF at 60 to 65% without regional wall motion abnormalities.  There was grade 1 diastolic dysfunction and tissue Doppler was consistent with high ventricular filling pressures.  There was mild left atrial dilation.  Because of her somewhat accelerated hypertension I recommended a renal artery duplex evaluation.  This demonstrated mild to less than 59% bilateral narrowing of the right and left renal arteries with abnormal resistive index.  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  She denies any abdominal symptoms.  When I saw her I reviewed data with reference to the clinical atherosclerosis.  We  discussed diet and her laboratory in May which had shown a total cholesterol of 206, HDL 34, LDL 118, and triglycerides of 271.   When I saw her in follow-up her blood pressure has been markedly improved on her increased medical regimen.  She felt well and had more energy. With her dyslipidemia, I  suggested initiation of low-dose statin therapy.  She will commence Crestor 10 mg every other  day for 2 weeks and if tolerated we will increase this to 10 mg daily.  I have recommended follow-up laboratory be obtained in several months.  She continued  to be on low-dose levothyroxine for mild hypothyroidism.   She apparently could not tolerate statin and was started on Zetia 10 mg.  She has tolerated this well.  She underwent repeat laboratory on May 12, 2018 which now showed a total cholesterol of 227, her triglycerides had increased to 914, HDL was 27.  When the patient was called, she remembered that she may have had positive the night before.  Upon further questioning she states back in 2015 her triglycerides on one occasion had gone from 95 up to 1305.  This ultimately improved.  She has been on over-the-counter fish oil 2 capsules daily.  Upon further questioning, particularly since she has been home alone during the COVID pandemic, she probably has had glasses of wine that normally.  In addition, she has had issues with her hip and as result has not been able to exercise or walk.  She had a hip injection today by Dr. Alvan Dame.  She presents for evaluation   The patient does not have symptoms concerning for COVID-19 infection (fever, chills, cough, or new shortness of breath).    Past Medical History:  Diagnosis Date   Depression    Fatigue    Hypercholesterolemia    Hyperlipidemia    Hypertension    controlled   Insomnia    Obesity    Skin disorder    Past Surgical History:  Procedure Laterality Date   APPENDECTOMY  1976   ENDOMETRIAL FULGURATION  1992   OOPHORECTOMY  1976     Current Meds  Medication Sig   aspirin 81 MG tablet Take 81 mg by mouth daily.     calcium citrate-vitamin D 200-200 MG-UNIT TABS Take 2 tablets by mouth daily.    estradiol (VIVELLE-DOT) 0.025 MG/24HR Place 1 patch onto the skin 2 (two) times a week.     Flaxseed, Linseed, (FLAXSEED OIL PO) Take 2 capsules by mouth daily. ON HOLD   levothyroxine (SYNTHROID, LEVOTHROID) 25 MCG  tablet Take 1 tablet (25 mcg total) by mouth daily.   MedroxyPROGESTERone Acetate (PROVERA PO) Take by mouth. Days 1-10 of every month   multivitamin (THERAGRAN) per tablet Take 1 tablet by mouth daily.     olmesartan (BENICAR) 40 MG tablet TAKE ONE TABLET DAILY   Omega-3 Fatty Acids (FISH OIL) 1000 MG CAPS HOLD (Patient taking differently: Take 1 capsule by mouth daily. HOLD)   spironolactone (ALDACTONE) 25 MG tablet TAKE ONE-HALF TABLET DAILY   TURMERIC CURCUMIN PO Take 1,000 mg by mouth.   Vitamin D, Ergocalciferol, (DRISDOL) 50000 units CAPS capsule Take 50,000 Units by mouth every 7 (seven) days.     Allergies:   Patient has no known allergies.   Social History   Tobacco Use   Smoking status: Never Smoker   Smokeless tobacco: Never Used  Substance Use Topics   Alcohol use: Yes    Comment: 2-3 glasses wine daily  Drug use: Not on file     Family Hx: The patient's family history includes Arrhythmia in her mother; Asthma in her brother, mother, and paternal aunt; Cancer in her brother and father; Emphysema in her mother.  ROS:   Please see the history of present illness.    There is no fever chills or night sweats. She denies change in vision or hearing She has been evaluated for hoarseness She denies wheezing She denies chest pain PND orthopnea or palpitations She has not been successful with weight loss Her activity has been less due to discomfort in her hip and she underwent a right hip injection for bursitis. She denies leg swelling She is sleeping okay but did not sleep well last night All other systems reviewed and are negative.   Prior CV studies:   The following studies were reviewed today:  I reviewed her recent laboratory and prior imaging studies  Labs/Other Tests and Data Reviewed:    EKG:  An ECG dated 11/10/2017 was personally reviewed today and demonstrated:  Sinus rhythm 84 bpm.  Borderline first-degree AV block with a PR interval 206 ms.   Mild left axis deviation.  No ectopy.  Recent Labs: 05/12/2018: ALT 31; BUN 13; Creatinine, Ser 0.57; Potassium 5.0; Sodium 141   Recent Lipid Panel Lab Results  Component Value Date/Time   CHOL 227 (H) 05/12/2018 08:32 AM   TRIG 914 (HH) 05/12/2018 08:32 AM   HDL 27 (L) 05/12/2018 08:32 AM   CHOLHDL 8.4 (H) 05/12/2018 08:32 AM   CHOLHDL 4 06/21/2010 08:11 AM   LDLCALC Comment 05/12/2018 08:32 AM    Wt Readings from Last 3 Encounters:  05/24/18 175 lb (79.4 kg)  11/10/17 184 lb 3.2 oz (83.6 kg)  08/31/17 188 lb 9.6 oz (85.5 kg)     Objective:    Vital Signs:  BP (!) 150/76    Pulse 89    Ht 5' 4.5" (1.638 m)    Wt 175 lb (79.4 kg)    BMI 29.57 kg/m    She is well-developed well-nourished in no acute distress Breathing is normal and not labored HEENT is unremarkable There is no audible wheezing Her rhythm is regular.  She is on any palpitations There is no chest discomfort to self palpation Her abdomen is nontender She underwent hip injection earlier today She denies edema or swelling She denies any change in neurologic issues She has normal cognition mood and affect  ASSESSMENT & PLAN:    1. Essential hypertension: She has been on olmesartan 40 mg and spironolactone 12.5 mg.  Blood pressure had improved, however, today is elevated.  She did not sleep well last night.  She had an injection today in her hip.  I recommended she continue to monitor blood pressure closely.  If blood pressure continues to be elevated further titration of spironolactone or addition of amlodipine may be necessary. 2. Mixed hyperlipidemia: Historically she had an episode of marked triglyceride elevation greater than 1300 in 2015.  However, recent triglycerides seem to have shown consistent elevation in the upper 200s.  Her most recent laboratory has shown significant increase to 914 with reduced HDL level.  I suspect may be contributed by increasing wine consumption is being alone with a stay away  order.  She also admitted to having positive the night before it may be sensitive to carbohydrate load.  She states that she had intermittently been taking her over-the-counter fish oil 2 capsules daily of a 1200 mg pill.  I  have recommended she discontinue over-the-counter fish oil and have recommended initiation of Vascepa 2 capsules twice daily and start her on fenofibrate as well.  I discussed the importance of significant improvement in diet.  She is now started on weight watchers and attempt to lose some weight. 3. Hypothyroidism: She continues to be on levothyroxine 4. Overweight: She just enrolled in weight watchers over the past week and admits to 5 pound weight loss since initiation 5. Pulmonary nodule [facility] at [facility] by Dr. Elenor Quinones  COVID-19 Education: The signs and symptoms of COVID-19 were discussed with the patient and how to seek care for testing (follow up with PCP or arrange E-visit).  The importance of social distancing was discussed today.  Time:   Today, I have spent 25 minutes with the patient with telehealth technology discussing the above problems.     Medication Adjustments/Labs and Tests Ordered: Current medicines are reviewed at length with the patient today.  Concerns regarding medicines are outlined above.   Tests Ordered: No orders of the defined types were placed in this encounter.   Medication Changes: No orders of the defined types were placed in this encounter.   Disposition:  Follow up;  comprehensive metabolic panel and lipid panel in 6 weeks with office visit in 8 weeks  Signed, Shelva Majestic, MD  05/24/2018 1:26 PM    [place]

## 2018-05-24 NOTE — Patient Instructions (Signed)
Medication Instructions:  Start Vascepa 2 capsules twice daily.  Start Fenofibrate 145 mg daily.   If you need a refill on your cardiac medications before your next appointment, please call your pharmacy.   Lab work: CMET, LIPID in 6 weeks. If you have labs (blood work) drawn today and your tests are completely normal, you will receive your results only by: Marland Kitchen MyChart Message (if you have MyChart) OR . A paper copy in the mail If you have any lab test that is abnormal or we need to change your treatment, we will call you to review the results.  Follow-Up: At [facility], you and your health needs are our priority.  As part of our continuing mission to provide you with exceptional heart care, we have created designated Provider Care Teams.  These Care Teams include your primary Cardiologist (physician) and Advanced Practice Providers (APPs -  Physician Assistants and Nurse Practitioners) who all work together to provide you with the care you need, when you need it. You will need a follow up appointment in 2 months.  You may see Dr.Kelly or one of the following Advanced Practice Providers on your designated Care Team: Almyra Deforest, [place] . Fabian Sharp, PA-C

## 2018-05-24 NOTE — Telephone Encounter (Signed)
Noted, thanks!

## 2018-06-04 ENCOUNTER — Telehealth: Payer: Self-pay | Admitting: *Deleted

## 2018-06-04 NOTE — Telephone Encounter (Signed)
PA for Vascepa submitted via cover my meds.

## 2018-06-09 ENCOUNTER — Encounter (HOSPITAL_BASED_OUTPATIENT_CLINIC_OR_DEPARTMENT_OTHER): Payer: Self-pay | Admitting: *Deleted

## 2018-06-09 ENCOUNTER — Other Ambulatory Visit: Payer: Self-pay

## 2018-06-11 ENCOUNTER — Other Ambulatory Visit: Payer: Self-pay

## 2018-06-11 ENCOUNTER — Other Ambulatory Visit (HOSPITAL_COMMUNITY)
Admission: RE | Admit: 2018-06-11 | Discharge: 2018-06-11 | Disposition: A | Discharge status: Home / Self Care | Payer: BLUE CROSS/BLUE SHIELD | Source: Ambulatory Visit | Attending: Obstetrics and Gynecology | Admitting: Obstetrics and Gynecology

## 2018-06-11 ENCOUNTER — Encounter (HOSPITAL_BASED_OUTPATIENT_CLINIC_OR_DEPARTMENT_OTHER)
Admission: RE | Admit: 2018-06-11 | Discharge: 2018-06-11 | Disposition: A | Discharge status: Home / Self Care | Payer: BLUE CROSS/BLUE SHIELD | Source: Ambulatory Visit | Attending: Obstetrics and Gynecology | Admitting: Obstetrics and Gynecology

## 2018-06-11 DIAGNOSIS — Z01812 Encounter for preprocedural laboratory examination: Secondary | ICD-10-CM | POA: Insufficient documentation

## 2018-06-11 DIAGNOSIS — Z7982 Long term (current) use of aspirin: Secondary | ICD-10-CM | POA: Diagnosis not present

## 2018-06-11 DIAGNOSIS — Z79899 Other long term (current) drug therapy: Secondary | ICD-10-CM | POA: Diagnosis not present

## 2018-06-11 DIAGNOSIS — N950 Postmenopausal bleeding: Secondary | ICD-10-CM | POA: Diagnosis not present

## 2018-06-11 DIAGNOSIS — E785 Hyperlipidemia, unspecified: Secondary | ICD-10-CM | POA: Diagnosis not present

## 2018-06-11 DIAGNOSIS — Z79890 Hormone replacement therapy (postmenopausal): Secondary | ICD-10-CM | POA: Diagnosis not present

## 2018-06-11 DIAGNOSIS — E039 Hypothyroidism, unspecified: Secondary | ICD-10-CM | POA: Diagnosis not present

## 2018-06-11 DIAGNOSIS — E669 Obesity, unspecified: Secondary | ICD-10-CM | POA: Diagnosis not present

## 2018-06-11 DIAGNOSIS — I10 Essential (primary) hypertension: Secondary | ICD-10-CM | POA: Diagnosis not present

## 2018-06-11 DIAGNOSIS — Z6828 Body mass index (BMI) 28.0-28.9, adult: Secondary | ICD-10-CM | POA: Diagnosis not present

## 2018-06-11 DIAGNOSIS — N840 Polyp of corpus uteri: Secondary | ICD-10-CM | POA: Diagnosis not present

## 2018-06-11 DIAGNOSIS — Z1159 Encounter for screening for other viral diseases: Secondary | ICD-10-CM | POA: Diagnosis not present

## 2018-06-11 DIAGNOSIS — E7800 Pure hypercholesterolemia, unspecified: Secondary | ICD-10-CM | POA: Diagnosis not present

## 2018-06-11 LAB — CBC
HCT: 37.5 % (ref 36.0–46.0)
Hemoglobin: 12.8 g/dL (ref 12.0–15.0)
MCH: 31.9 pg (ref 26.0–34.0)
MCHC: 34.1 g/dL (ref 30.0–36.0)
MCV: 93.5 fL (ref 80.0–100.0)
Platelets: 279 10*3/uL (ref 150–400)
RBC: 4.01 MIL/uL (ref 3.87–5.11)
RDW: 11.4 % — ABNORMAL LOW (ref 11.5–15.5)
WBC: 5.3 10*3/uL (ref 4.0–10.5)
nRBC: 0 % (ref 0.0–0.2)

## 2018-06-11 LAB — TYPE AND SCREEN
ABO/RH(D): O POS
Antibody Screen: NEGATIVE

## 2018-06-11 LAB — ABO/RH: ABO/RH(D): O POS

## 2018-06-11 NOTE — Progress Notes (Signed)
Ensure pre surgery drink given with instructions to complete by 0400 dos, pt verbalized understanding. 

## 2018-06-12 LAB — NOVEL CORONAVIRUS, NAA (HOSP ORDER, SEND-OUT TO REF LAB; TAT 18-24 HRS): SARS-CoV-2, NAA: NOT DETECTED

## 2018-06-14 NOTE — Anesthesia Preprocedure Evaluation (Signed)
Anesthesia Evaluation  Patient identified by MRN, date of birth, ID band Patient awake    Reviewed: Allergy & Precautions, H&P , NPO status , Patient's Chart, lab work & pertinent test results  Airway Mallampati: II  TM Distance: >3 FB Neck ROM: Full    Dental no notable dental hx.    Pulmonary neg pulmonary ROS,    Pulmonary exam normal breath sounds clear to auscultation       Cardiovascular Exercise Tolerance: Good hypertension, negative cardio ROS Normal cardiovascular exam Rhythm:Regular Rate:Normal  Echo 8/19 Left ventricle:  The cavity size was normal. There was mild concentric hypertrophy. Systolic function was normal. The estimated ejection fraction was in the range of 60% to 65%. Wall motion was normal; there were no regional wall motion abnormalities. Doppler parameters are consistent with abnormal left ventricular relaxation (grade 1 diastolic dysfunction).    Neuro/Psych PSYCHIATRIC DISORDERS Depression negative neurological ROS     GI/Hepatic negative GI ROS, Neg liver ROS,   Endo/Other  negative endocrine ROSHypothyroidism   Renal/GU negative Renal ROS  negative genitourinary   Musculoskeletal negative musculoskeletal ROS (+)   Abdominal   Peds negative pediatric ROS (+)  Hematology negative hematology ROS (+)   Anesthesia Other Findings   Reproductive/Obstetrics negative OB ROS                             Anesthesia Physical Anesthesia Plan  ASA: III  Anesthesia Plan: General   Post-op Pain Management:    Induction: Intravenous  PONV Risk Score and Plan: 3 and Ondansetron, Treatment may vary due to age or medical condition and Dexamethasone  Airway Management Planned: LMA and Oral ETT  Additional Equipment:   Intra-op Plan:   Post-operative Plan: Extubation in OR  Informed Consent: I have reviewed the patients History and Physical, chart, labs and  discussed the procedure including the risks, benefits and alternatives for the proposed anesthesia with the patient or authorized representative who has indicated his/her understanding and acceptance.       Plan Discussed with: CRNA, Surgeon and Anesthesiologist  Anesthesia Plan Comments: ( )        Anesthesia Quick Evaluation

## 2018-06-15 ENCOUNTER — Encounter (HOSPITAL_BASED_OUTPATIENT_CLINIC_OR_DEPARTMENT_OTHER): Payer: Self-pay

## 2018-06-15 ENCOUNTER — Encounter (HOSPITAL_BASED_OUTPATIENT_CLINIC_OR_DEPARTMENT_OTHER)
Admission: RE | Disposition: A | Discharge status: Home / Self Care | Payer: Self-pay | Source: Home / Self Care | Attending: Obstetrics and Gynecology

## 2018-06-15 ENCOUNTER — Ambulatory Visit (HOSPITAL_BASED_OUTPATIENT_CLINIC_OR_DEPARTMENT_OTHER): Payer: BLUE CROSS/BLUE SHIELD | Admitting: Anesthesiology

## 2018-06-15 ENCOUNTER — Other Ambulatory Visit: Payer: Self-pay

## 2018-06-15 ENCOUNTER — Ambulatory Visit (HOSPITAL_BASED_OUTPATIENT_CLINIC_OR_DEPARTMENT_OTHER)
Admission: RE | Admit: 2018-06-15 | Discharge: 2018-06-15 | Disposition: A | Discharge status: Home / Self Care | Payer: BLUE CROSS/BLUE SHIELD | Attending: Obstetrics and Gynecology | Admitting: Obstetrics and Gynecology

## 2018-06-15 DIAGNOSIS — Z79899 Other long term (current) drug therapy: Secondary | ICD-10-CM | POA: Insufficient documentation

## 2018-06-15 DIAGNOSIS — Z79890 Hormone replacement therapy (postmenopausal): Secondary | ICD-10-CM | POA: Insufficient documentation

## 2018-06-15 DIAGNOSIS — E039 Hypothyroidism, unspecified: Secondary | ICD-10-CM | POA: Insufficient documentation

## 2018-06-15 DIAGNOSIS — E669 Obesity, unspecified: Secondary | ICD-10-CM | POA: Insufficient documentation

## 2018-06-15 DIAGNOSIS — N950 Postmenopausal bleeding: Secondary | ICD-10-CM | POA: Diagnosis not present

## 2018-06-15 DIAGNOSIS — I10 Essential (primary) hypertension: Secondary | ICD-10-CM | POA: Insufficient documentation

## 2018-06-15 DIAGNOSIS — Z6828 Body mass index (BMI) 28.0-28.9, adult: Secondary | ICD-10-CM | POA: Insufficient documentation

## 2018-06-15 DIAGNOSIS — N840 Polyp of corpus uteri: Secondary | ICD-10-CM | POA: Insufficient documentation

## 2018-06-15 DIAGNOSIS — E785 Hyperlipidemia, unspecified: Secondary | ICD-10-CM | POA: Insufficient documentation

## 2018-06-15 DIAGNOSIS — Z7982 Long term (current) use of aspirin: Secondary | ICD-10-CM | POA: Insufficient documentation

## 2018-06-15 DIAGNOSIS — E7800 Pure hypercholesterolemia, unspecified: Secondary | ICD-10-CM | POA: Insufficient documentation

## 2018-06-15 HISTORY — DX: Hypothyroidism, unspecified: E03.9

## 2018-06-15 HISTORY — PX: HYSTEROSCOPY WITH D & C: SHX1775

## 2018-06-15 SURGERY — DILATATION AND CURETTAGE /HYSTEROSCOPY
Anesthesia: General | Site: Vagina

## 2018-06-15 MED ORDER — LACTATED RINGERS IV SOLN
INTRAVENOUS | Status: DC
  Administered 2018-06-15: 07:00:00 via INTRAVENOUS

## 2018-06-15 MED ORDER — SODIUM CHLORIDE 0.9 % IV SOLN
INTRAVENOUS | Status: AC
  Filled 2018-06-15: qty 2

## 2018-06-15 MED ORDER — SUCCINYLCHOLINE CHLORIDE 200 MG/10ML IV SOSY
PREFILLED_SYRINGE | INTRAVENOUS | Status: AC
  Filled 2018-06-15: qty 10

## 2018-06-15 MED ORDER — MIDAZOLAM HCL 2 MG/2ML IJ SOLN
INTRAMUSCULAR | Status: AC
  Filled 2018-06-15: qty 2

## 2018-06-15 MED ORDER — ONDANSETRON HCL 4 MG/2ML IJ SOLN: 4.0000 mg | Freq: Once | INTRAMUSCULAR | Status: DC | PRN

## 2018-06-15 MED ORDER — ONDANSETRON HCL 4 MG/2ML IJ SOLN
INTRAMUSCULAR | Status: AC
  Filled 2018-06-15: qty 2

## 2018-06-15 MED ORDER — FENTANYL CITRATE (PF) 100 MCG/2ML IJ SOLN
50.0000 ug | INTRAMUSCULAR | Status: DC | PRN
  Administered 2018-06-15: 100 ug via INTRAVENOUS

## 2018-06-15 MED ORDER — LIDOCAINE HCL (PF) 1 % IJ SOLN
INTRAMUSCULAR | Status: AC
  Filled 2018-06-15: qty 30

## 2018-06-15 MED ORDER — OXYCODONE HCL 5 MG/5ML PO SOLN: 5.0000 mg | Freq: Once | ORAL | Status: DC | PRN

## 2018-06-15 MED ORDER — DEXAMETHASONE SODIUM PHOSPHATE 4 MG/ML IJ SOLN
INTRAMUSCULAR | Status: DC | PRN
  Administered 2018-06-15: 10 mg via INTRAVENOUS

## 2018-06-15 MED ORDER — ACETAMINOPHEN 325 MG PO TABS: 325.0000 mg | ORAL_TABLET | ORAL | Status: DC | PRN

## 2018-06-15 MED ORDER — PROPOFOL 500 MG/50ML IV EMUL
INTRAVENOUS | Status: AC
  Filled 2018-06-15: qty 50

## 2018-06-15 MED ORDER — MIDAZOLAM HCL 2 MG/2ML IJ SOLN
1.0000 mg | INTRAMUSCULAR | Status: DC | PRN
  Administered 2018-06-15: 2 mg via INTRAVENOUS

## 2018-06-15 MED ORDER — OXYCODONE HCL 5 MG PO TABS: 5.0000 mg | ORAL_TABLET | Freq: Once | ORAL | Status: DC | PRN

## 2018-06-15 MED ORDER — FENTANYL CITRATE (PF) 100 MCG/2ML IJ SOLN
INTRAMUSCULAR | Status: AC
  Filled 2018-06-15: qty 2

## 2018-06-15 MED ORDER — KETOROLAC TROMETHAMINE 30 MG/ML IJ SOLN
INTRAMUSCULAR | Status: DC | PRN
  Administered 2018-06-15: 30 mg via INTRAVENOUS

## 2018-06-15 MED ORDER — ACETAMINOPHEN 160 MG/5ML PO SOLN: 325.0000 mg | ORAL | Status: DC | PRN

## 2018-06-15 MED ORDER — FENTANYL CITRATE (PF) 100 MCG/2ML IJ SOLN
25.0000 ug | INTRAMUSCULAR | Status: DC | PRN
  Administered 2018-06-15 (×2): 50 ug via INTRAVENOUS

## 2018-06-15 MED ORDER — PROPOFOL 10 MG/ML IV BOLUS
INTRAVENOUS | Status: DC | PRN
  Administered 2018-06-15: 150 mg via INTRAVENOUS

## 2018-06-15 MED ORDER — LIDOCAINE 2% (20 MG/ML) 5 ML SYRINGE
INTRAMUSCULAR | Status: AC
  Filled 2018-06-15: qty 5

## 2018-06-15 MED ORDER — KETOROLAC TROMETHAMINE 30 MG/ML IJ SOLN
INTRAMUSCULAR | Status: AC
  Filled 2018-06-15: qty 1

## 2018-06-15 MED ORDER — PHENYLEPHRINE 40 MCG/ML (10ML) SYRINGE FOR IV PUSH (FOR BLOOD PRESSURE SUPPORT)
PREFILLED_SYRINGE | INTRAVENOUS | Status: AC
  Filled 2018-06-15: qty 10

## 2018-06-15 MED ORDER — DEXAMETHASONE SODIUM PHOSPHATE 10 MG/ML IJ SOLN
INTRAMUSCULAR | Status: AC
  Filled 2018-06-15: qty 1

## 2018-06-15 MED ORDER — LIDOCAINE HCL 1 % IJ SOLN
INTRAMUSCULAR | Status: DC | PRN
  Administered 2018-06-15: 4 mL

## 2018-06-15 MED ORDER — LIDOCAINE HCL (CARDIAC) PF 100 MG/5ML IV SOSY
PREFILLED_SYRINGE | INTRAVENOUS | Status: DC | PRN
  Administered 2018-06-15: 50 mg via INTRAVENOUS

## 2018-06-15 MED ORDER — EPHEDRINE 5 MG/ML INJ
INTRAVENOUS | Status: AC
  Filled 2018-06-15: qty 10

## 2018-06-15 MED ORDER — ONDANSETRON HCL 4 MG/2ML IJ SOLN
INTRAMUSCULAR | Status: DC | PRN
  Administered 2018-06-15: 4 mg via INTRAVENOUS

## 2018-06-15 MED ORDER — MEPERIDINE HCL 25 MG/ML IJ SOLN: 6.2500 mg | INTRAMUSCULAR | Status: DC | PRN

## 2018-06-15 MED ORDER — SODIUM CHLORIDE 0.9 % IV SOLN
2.0000 g | INTRAVENOUS | Status: AC
  Administered 2018-06-15: 2 g via INTRAVENOUS

## 2018-06-15 MED ORDER — SCOPOLAMINE 1 MG/3DAYS TD PT72: 1.0000 | MEDICATED_PATCH | Freq: Once | TRANSDERMAL | Status: DC | PRN

## 2018-06-15 MED ORDER — LACTATED RINGERS IV SOLN: INTRAVENOUS | Status: DC

## 2018-06-15 MED ORDER — SODIUM CHLORIDE 0.9 % IR SOLN
Status: DC | PRN
  Administered 2018-06-15: 3000 mL

## 2018-06-15 SURGICAL SUPPLY — 22 items
BIPOLAR CUTTING LOOP 21FR (ELECTRODE)
BRIEF STRETCH FOR OB PAD XXL (UNDERPADS AND DIAPERS) ×2 IMPLANT
CANISTER SUCT 3000ML PPV (MISCELLANEOUS) ×2 IMPLANT
CATH ROBINSON RED A/P 16FR (CATHETERS) ×1 IMPLANT
DILATOR CANAL MILEX (MISCELLANEOUS) ×1 IMPLANT
ELECT COAG BIPOL BALL 21FR (ELECTRODE) IMPLANT
ELECT REM PT RETURN 9FT ADLT (ELECTROSURGICAL)
ELECTRODE REM PT RTRN 9FT ADLT (ELECTROSURGICAL) IMPLANT
GLOVE BIO SURGEON STRL SZ 6.5 (GLOVE) ×2 IMPLANT
GLOVE BIOGEL PI IND STRL 7.0 (GLOVE) ×1 IMPLANT
GLOVE BIOGEL PI INDICATOR 7.0 (GLOVE) ×2
GOWN STRL REUS W/TWL LRG LVL3 (GOWN DISPOSABLE) ×4 IMPLANT
KIT PROCEDURE FLUENT (KITS) ×2 IMPLANT
LOOP CUTTING BIPOLAR 21FR (ELECTRODE) IMPLANT
NDL SPNL 25GX3.5 QUINCKE BL (NEEDLE) IMPLANT
NEEDLE SPNL 25GX3.5 QUINCKE BL (NEEDLE) ×2 IMPLANT
PACK VAGINAL MINOR WOMEN LF (CUSTOM PROCEDURE TRAY) ×2 IMPLANT
PAD OB MATERNITY 4.3X12.25 (PERSONAL CARE ITEMS) ×2 IMPLANT
PAD PREP 24X48 CUFFED NSTRL (MISCELLANEOUS) ×2 IMPLANT
SLEEVE SCD COMPRESS KNEE MED (MISCELLANEOUS) ×2 IMPLANT
TOWEL GREEN STERILE FF (TOWEL DISPOSABLE) ×4 IMPLANT
WATER STERILE IRR 1000ML POUR (IV SOLUTION) ×1 IMPLANT

## 2018-06-15 NOTE — H&P (Signed)
63 year old female with postmenopausal spotting and ultrasound suspicious for endometrial polyp.  Past Medical History:  Diagnosis Date  . Depression   . Fatigue   . Hypercholesterolemia   . Hyperlipidemia   . Hypertension    controlled  . Hypothyroidism   . Insomnia   . Obesity   . Skin disorder    Past Surgical History:  Procedure Laterality Date  . APPENDECTOMY  1976  . BREAST IMPLANT REMOVAL Bilateral    implanted 1996 removed 2002  . ENDOMETRIAL FULGURATION  1992  . OOPHORECTOMY  1976   Prior to Admission medications   Medication Sig Start Date End Date Taking? Authorizing Provider  aspirin 81 MG tablet Take 81 mg by mouth daily.     Yes [provider]  calcium citrate-vitamin D 200-200 MG-UNIT TABS Take 2 tablets by mouth daily.    Yes [provider]  estradiol (VIVELLE-DOT) 0.025 MG/24HR Place 1 patch onto the skin 2 (two) times a week.     Yes [provider]  ezetimibe (ZETIA) 10 MG tablet Take 1 tablet (10 mg total) by mouth daily. 01/14/18 06/09/18 Yes Troy Sine, MD  fenofibrate (TRICOR) 145 MG tablet Take 1 tablet (145 mg total) by mouth daily. 05/24/18  Yes Troy Sine, MD  Flaxseed, Linseed, (FLAXSEED OIL PO) Take 2 capsules by mouth daily. ON HOLD   Yes [provider]  Icosapent Ethyl (VASCEPA) 1 g CAPS Take 2 capsules (2 g total) by mouth 2 (two) times daily. 05/24/18  Yes Troy Sine, MD  levothyroxine (SYNTHROID, LEVOTHROID) 25 MCG tablet Take 1 tablet (25 mcg total) by mouth daily. 11/26/10  Yes Burtis Junes, NP  MedroxyPROGESTERone Acetate (PROVERA PO) Take by mouth. Days 1-10 of every month   Yes [provider]  multivitamin [facility]) per tablet Take 1 tablet by mouth daily.     Yes [provider]  olmesartan (BENICAR) 40 MG tablet TAKE ONE TABLET DAILY 04/28/18  Yes Troy Sine, MD  spironolactone (ALDACTONE) 25 MG tablet TAKE ONE-HALF TABLET DAILY 04/28/18  Yes Troy Sine, MD   TURMERIC CURCUMIN PO Take 1,000 mg by mouth.   Yes [provider]  Vitamin D, Ergocalciferol, (DRISDOL) 50000 units CAPS capsule Take 50,000 Units by mouth every 7 (seven) days.   Yes [provider]   Patient has no known allergies. General alert and oriented Lung CTAB Car RRR Abdomen Soft and non tender Pelvic WNL except for above  No results found for this or any previous visit (from the past 24 hour(s)).  IMPRESSION: PMP Bleeding Endometrial Polyp  PLAN: D and C  Hysteroscopy Removal of endometrial polyp

## 2018-06-15 NOTE — Discharge Instructions (Signed)

## 2018-06-15 NOTE — Brief Op Note (Signed)
06/15/2018  8:00 AM  PATIENT:  Megan Savage  63 y.o. female  PRE-OPERATIVE DIAGNOSIS:  ENDOMETRIAL THICKENING Postmenopausal Bleeding  POST-OPERATIVE DIAGNOSIS:  ENDOMETRIAL THICKENING Postmenopausal Bleeding  PROCEDURE:  Procedure(s): DILATATION AND CURETTAGE /HYSTEROSCOPY  SURGEON:  Surgeon(s) and Role:    * Dian Queen, MD - Primary  PHYSICIAN ASSISTANT:   ASSISTANTS: none   ANESTHESIA:   IV sedation and MAC  EBL:  10 mL   BLOOD ADMINISTERED:none  DRAINS: none   LOCAL MEDICATIONS USED:  LIDOCAINE   SPECIMEN:  Source of Specimen:  endometrial curettings  DISPOSITION OF SPECIMEN:  PATHOLOGY  COUNTS:  YES  TOURNIQUET:  * No tourniquets in log *  DICTATION: .Other Dictation: Dictation Number dictated  PLAN OF CARE: Discharge to home after PACU  PATIENT DISPOSITION:  PACU - hemodynamically stable.   Delay start of Pharmacological VTE agent (>24hrs) due to surgical blood loss or risk of bleeding: not applicable

## 2018-06-15 NOTE — Transfer of Care (Signed)
Immediate Anesthesia Transfer of Care Note  Patient: Megan Savage  Procedure(s) Performed: DILATATION AND CURETTAGE /HYSTEROSCOPY (Vagina )  Patient Location: PACU  Anesthesia Type:General  Level of Consciousness: awake, alert , oriented and drowsy  Airway & Oxygen Therapy: Patient Spontanous Breathing and Patient connected to face mask oxygen  Post-op Assessment: Report given to RN and Post -op Vital signs reviewed and stable  Post vital signs: Reviewed and stable  Last Vitals:  Vitals Value Taken Time  BP 138/78 06/15/2018  8:09 AM  Temp    Pulse 98 06/15/2018  8:10 AM  Resp 16 06/15/2018  8:10 AM  SpO2 100 % 06/15/2018  8:10 AM  Vitals shown include unvalidated device data.  Last Pain:  Vitals:   06/15/18 0659  TempSrc: Oral         Complications: No apparent anesthesia complications

## 2018-06-15 NOTE — Anesthesia Procedure Notes (Signed)
Procedure Name: LMA Insertion Date/Time: 06/15/2018 7:36 AM Performed by: Willa Frater, CRNA Pre-anesthesia Checklist: Patient identified, Emergency Drugs available, Suction available and Patient being monitored Patient Re-evaluated:Patient Re-evaluated prior to induction Oxygen Delivery Method: Circle system utilized Preoxygenation: Pre-oxygenation with 100% oxygen Induction Type: IV induction Ventilation: Mask ventilation without difficulty LMA: LMA inserted LMA Size: 4.0 Number of attempts: 1 Airway Equipment and Method: Bite block Placement Confirmation: positive ETCO2 Tube secured with: Tape Dental Injury: Teeth and Oropharynx as per pre-operative assessment

## 2018-06-15 NOTE — Anesthesia Postprocedure Evaluation (Signed)
Anesthesia Post Note  Patient: Megan Savage  Procedure(s) Performed: DILATATION AND CURETTAGE /HYSTEROSCOPY (N/A Vagina )     Patient location during evaluation: PACU Anesthesia Type: General Level of consciousness: awake and alert Pain management: pain level controlled Vital Signs Assessment: post-procedure vital signs reviewed and stable Respiratory status: spontaneous breathing, nonlabored ventilation, respiratory function stable and patient connected to nasal cannula oxygen Cardiovascular status: blood pressure returned to baseline and stable Postop Assessment: no apparent nausea or vomiting Anesthetic complications: no    Last Vitals:  Vitals:   06/15/18 0845 06/15/18 0903  BP: (!) 142/63 (!) 151/69  Pulse: 87 88  Resp: 18 16  Temp:  36.9 C  SpO2: 100% 100%    Last Pain:  Vitals:   06/15/18 0903  TempSrc:   PainSc: 3                  Kasen Sako

## 2018-06-16 ENCOUNTER — Encounter (HOSPITAL_BASED_OUTPATIENT_CLINIC_OR_DEPARTMENT_OTHER): Payer: Self-pay | Admitting: Obstetrics and Gynecology

## 2018-06-16 NOTE — Op Note (Signed)
NAME: Megan Savage, Megan Savage MEDICAL RECORD [id number] ACCOUNT [id number] DATE OF BIRTH:1955/07/24 FACILITY: [facility] LOCATION: [facility] PHYSICIAN:Mollyann Halbert Lynett Fish, MD  OPERATIVE REPORT  DATE OF PROCEDURE:  06/15/2018  PREOPERATIVE DIAGNOSES:  Postmenopausal bleeding and thickened endometrium.  POSTOPERATIVE DIAGNOSES:  Postmenopausal bleeding and thickened endometrium.  PROCEDURE:  D and C hysteroscopy.  SURGEON:  Dian Queen, MD  ANESTHESIA:  IV sedation and MAC.  ESTIMATED BLOOD LOSS:  Less than 10 mL.  COMPLICATIONS:  None.  DRAINS:  None.  PATHOLOGY:  Uterine curettings.  DESCRIPTION OF PROCEDURE:  The patient was taken to the operating room after informed consent was obtained.  She was administered anesthesia.  She was prepped and draped in the usual sterile fashion.  An in and out catheter had been used to empty the  bladder.  The speculum was inserted.  The cervix was noted to be normal grossly except for cervical stenosis.  I grasped the cervix with a tenaculum at the anterior and posterior lips and made a paracervical block and used the os finder to find the os.   After I did that, it was quite easy to dilate the cervix.  I used Sport and exercise psychologist.  The diagnostic hysteroscope was inserted into the uterine cavity with excellent visualization.  There were 2 areas that I noted.  One was on the patient's left mid wall of  the uterus, which appeared possibly polypoid.  There was also a second smaller area anterior and near the lower uterine segment that also appeared to be polypoid.  I removed the hysteroscope and curetted the uterus several times and used polyp forceps  and removed all the tissue.  I inserted the diagnostic hysteroscope and the uterine cavity was clean.  All instruments were removed from the vagina.  All sponge, lap and instrument counts correct x2.  The patient went to recovery room in stable  condition.  LN/NUANCE  D:06/16/2018 T:06/16/2018  JOB:[id number]

## 2018-07-21 ENCOUNTER — Other Ambulatory Visit: Payer: Self-pay | Admitting: *Deleted

## 2018-07-21 DIAGNOSIS — I10 Essential (primary) hypertension: Secondary | ICD-10-CM

## 2018-07-21 DIAGNOSIS — E782 Mixed hyperlipidemia: Secondary | ICD-10-CM

## 2018-07-22 LAB — COMPREHENSIVE METABOLIC PANEL
ALT: 19 IU/L (ref 0–32)
AST: 17 IU/L (ref 0–40)
Albumin/Globulin Ratio: 2.8 — ABNORMAL HIGH (ref 1.2–2.2)
Albumin: 4.7 g/dL (ref 3.8–4.8)
Alkaline Phosphatase: 38 IU/L — ABNORMAL LOW (ref 39–117)
BUN/Creatinine Ratio: 22 (ref 12–28)
BUN: 15 mg/dL (ref 8–27)
Bilirubin Total: 0.5 mg/dL (ref 0.0–1.2)
CO2: 22 mmol/L (ref 20–29)
Calcium: 9.6 mg/dL (ref 8.7–10.3)
Chloride: 101 mmol/L (ref 96–106)
Creatinine, Ser: 0.67 mg/dL (ref 0.57–1.00)
GFR calc Af Amer: 107 mL/min/{1.73_m2} (ref >59)
GFR calc non Af Amer: 93 mL/min/{1.73_m2} (ref >59)
Globulin, Total: 1.7 g/dL (ref 1.5–4.5)
Glucose: 111 mg/dL — ABNORMAL HIGH (ref 65–99)
Potassium: 4.3 mmol/L (ref 3.5–5.2)
Sodium: 135 mmol/L (ref 134–144)
Total Protein: 6.4 g/dL (ref 6.0–8.5)

## 2018-07-22 LAB — LIPID PANEL
Chol/HDL Ratio: 2.8 ratio (ref 0.0–4.4)
Cholesterol, Total: 118 mg/dL (ref 100–199)
HDL: 42 mg/dL (ref >39)
LDL Calculated: 59 mg/dL (ref 0–99)
Triglycerides: 83 mg/dL (ref 0–149)
VLDL Cholesterol Cal: 17 mg/dL (ref 5–40)

## 2018-07-29 ENCOUNTER — Ambulatory Visit (INDEPENDENT_AMBULATORY_CARE_PROVIDER_SITE_OTHER): Payer: BC Managed Care – PPO | Admitting: Cardiovascular Disease

## 2018-07-29 ENCOUNTER — Encounter: Payer: Self-pay | Admitting: Cardiovascular Disease

## 2018-07-29 ENCOUNTER — Other Ambulatory Visit: Payer: Self-pay

## 2018-07-29 VITALS — BP 119/72 | HR 81 | Temp 97.3°F | Ht 64.5 in | Wt 164.2 lb

## 2018-07-29 DIAGNOSIS — I10 Essential (primary) hypertension: Secondary | ICD-10-CM | POA: Diagnosis not present

## 2018-07-29 DIAGNOSIS — R911 Solitary pulmonary nodule: Secondary | ICD-10-CM

## 2018-07-29 DIAGNOSIS — E782 Mixed hyperlipidemia: Secondary | ICD-10-CM

## 2018-07-29 DIAGNOSIS — E039 Hypothyroidism, unspecified: Secondary | ICD-10-CM | POA: Diagnosis not present

## 2018-07-29 DIAGNOSIS — E663 Overweight: Secondary | ICD-10-CM

## 2018-07-29 NOTE — Patient Instructions (Signed)
Medication Instructions:  The current medical regimen is effective;  continue present plan and medications.  If you need a refill on your cardiac medications before your next appointment, please call your pharmacy.   Follow-Up: At [facility], you and your health needs are our priority.  As part of our continuing mission to provide you with exceptional heart care, we have created designated Provider Care Teams.  These Care Teams include your primary Cardiologist (physician) and Advanced Practice Providers (APPs -  Physician Assistants and Nurse Practitioners) who all work together to provide you with the care you need, when you need it. You will need a follow up appointment in 8 months.  Please call our office 2 months in advance to schedule this appointment.  You may see Dr.Kelly or one of the following Advanced Practice Providers on your designated Care Team: Almyra Deforest, [place] . Fabian Sharp, PA-C

## 2018-07-29 NOTE — Progress Notes (Signed)
Cardiology Office Note    Date:  07/31/2018   ID:  Megan Savage, DOB [birth year], MRN [id number]  PCP:  Shon Baton, MD  Cardiologist:  Shelva Majestic, MD   Follow-up office visit  History of Present Illness:  Megan Savage is a 63 y.o. female who is followed by Dr. Shon Baton for primary care.  I saw her for initial evaluation in August 2019 when she was self-referred for difficult control hypertension and cardiovascular risk assessment.  She presents for 42-monthfollow-up evaluation.  .  Megan Savage known to me.  She remotely had been seen by Dr. TDoreatha Lewin 2012 and at that time had mild hypertension, hypohyroidism, obesity, and hyperlipidemia.  She was on valsartan 80 mg, levothyroxine 25 mcg, and omega-3 fatty acid.  She lost a significant amount of weight approximately 4 years ago and it and with a 36 pound weight loss her blood pressure improved and apparently she was taken off medication.  She had been started back on blood pressure medication in February 2019 and due to the valsartan recall was initially started on losartan.  She is now on 100 mg daily.  Bite this, she has noticed that her blood pressures have been consistently elevated.  She has regained some of her weight back.  On July 17, 2017 undergone a CT calcium score was 0.  She was incidentally found to have a right lower lobe noncalcified pulmonary nodule.  She [facility]  She has a family history of heart issues and grandparents and her mother dying at 89secondary to arrhythmia.  When I initially saw her she denied any chest pain, PND orthopnea.  She denied any awareness of sleep apnea but sleeps by herself and was unaware if she snores.   When I initially saw her, she was significantly hypertensive with blood pressure 170/88 without orthostatic change.  At that time, I recommended discontinuing  losartan and in its place substituted this for olmesartan 40 mg which should be more potent.  I also recommended the addition of Spironolactone 12.5 mg.  She underwent an echo Doppler study which demonstrated mild concentric LVH with normal EF at 60 to 65% without regional wall motion abnormalities.  There was grade 1 diastolic dysfunction and tissue Doppler was consistent with high ventricular filling pressures.  There was mild left atrial dilation.  Because of her somewhat accelerated hypertension I recommended a renal artery duplex evaluation.  This demonstrated mild to less than 59% bilateral narrowing of the right and left renal arteries with abnormal resistive index.  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  She denies any abdominal symptoms.  When I saw her I reviewed data with reference to the clinical atherosclerosis.  We discussed diet and her laboratory in May which had shown a total cholesterol of 206, HDL 34, LDL 118, and triglycerides of 271.   When I saw her in the office In October 2019 her blood pressure had improved on her increased medical regimen of olmesartan 40 mg and spironolactone 12.5 mg.  I recommended initiation of rosuvastatin 10 mg every other day for 2 weeks and if tolerated to try to increase this to 10 mg daily.    Once the COVID-19 pandemic kit, she became more sedentary and essentially stayed home.  Initially she was drinking  more wine than normally.  I saw her for a telemedicine visit on May 24, 2018.  She could not tolerate the statin and had been started on Zetia 10 mg.  She underwent repeat laboratory prior to that telemedicine visit on May 12, 2018 and her total cholesterol increased to 227, triglycerides 914, HDL 27.  Remotely she recalled back in 2015 her triglycerides on 1 occasion had increased to 1305.  During that evaluation her blood pressure was elevated but she had received a hip injection earlier that morning.   Subsequently, she enrolled in weight watchers.  She dramatically changed her diet and significantly reduced her wine consumption, not drinking at all during the week and only minimally on the weekends.   With the above changes to her regimen and with dietary adherence to weight watchers she has lost 22 pounds since May 24, 2018.  Her blood pressure has significantly improved and her dose of olmesartan was reduced due to her calling and stating her blood pressure was getting low.  She underwent recent repeat laboratory which was dramatically improved with total cholesterol improving from 2 27-1 18, triglycerides from 9 14-83, HDL increasing from 27-42, and her LDL cholesterol improved to 59.  She feels great.  She presents for an office evaluation today.   Past Medical History:  Diagnosis Date   Depression    Fatigue    Hypercholesterolemia    Hyperlipidemia    Hypertension    controlled   Hypothyroidism    Insomnia    Obesity    Skin disorder     Past Surgical History:  Procedure Laterality Date   APPENDECTOMY  1976   BREAST IMPLANT REMOVAL Bilateral    implanted 1996 removed 2002   ENDOMETRIAL FULGURATION  1992   HYSTEROSCOPY W/D&C N/A 06/15/2018   Procedure: DILATATION AND CURETTAGE /HYSTEROSCOPY;  Surgeon: Dian Queen, MD;  Location: [facility];  Service: Gynecology;  Laterality: N/A;   OOPHORECTOMY  1976    Current Medications: Outpatient Medications Prior to Visit  Medication Sig Dispense Refill   aspirin 81 MG tablet Take 81 mg by mouth daily.       calcium citrate-vitamin D 200-200 MG-UNIT TABS Take 2 tablets by mouth daily.      estradiol (VIVELLE-DOT) 0.025 MG/24HR Place 1 patch onto the skin 2 (two) times a week.       ezetimibe (ZETIA) 10 MG tablet Take 1 tablet (10 mg total) by mouth daily. 90 tablet 3   fenofibrate (TRICOR) 145 MG tablet Take 1 tablet (145 mg total) by mouth daily. 90 tablet 1   Flaxseed, Linseed, (FLAXSEED  OIL PO) Take 2 capsules by mouth daily. ON HOLD     Icosapent Ethyl (VASCEPA) 1 g CAPS Take 2 capsules (2 g total) by mouth 2 (two) times daily. 120 capsule 2   levothyroxine (SYNTHROID, LEVOTHROID) 25 MCG tablet Take 1 tablet (25 mcg total) by mouth daily. 30 tablet 1   MedroxyPROGESTERone Acetate (PROVERA PO) Take by mouth. Days 1-10 of every month     multivitamin (THERAGRAN) per tablet Take 1 tablet by mouth daily.       olmesartan (BENICAR) 40 MG tablet TAKE ONE TABLET DAILY 30 tablet 5   spironolactone (ALDACTONE) 25 MG tablet TAKE ONE-HALF TABLET DAILY 15 tablet 5   TURMERIC CURCUMIN PO Take 1,000 mg by mouth.     Vitamin D, Ergocalciferol, (DRISDOL) 50000 units CAPS capsule Take 50,000 Units by mouth every 7 (seven) days.  No facility-administered medications prior to visit.      Allergies:   Patient has no known allergies.   Social History   Socioeconomic History   Marital status: Divorced    Spouse name: Not on file   Number of children: Not on file   Years of education: Not on file   Highest education level: Not on file  Occupational History   Occupation: Astronomer: SELF-EMPLOYED  Social Designer, fashion/clothing strain: Not on file   Food insecurity    Worry: Not on file    Inability: Not on file   Transportation needs    Medical: Not on file    Non-medical: Not on file  Tobacco Use   Smoking status: Never Smoker   Smokeless tobacco: Never Used  Substance and Sexual Activity   Alcohol use: Yes    Comment: 2-3 glasses wine daily   Drug use: Never   Sexual activity: Not on file  Lifestyle   Physical activity    Days per week: Not on file    Minutes per session: Not on file   Stress: Not on file  Relationships   Social connections    Talks on phone: Not on file    Gets together: Not on file    Attends religious service: Not on file    Active member of club or organization: Not on file    Attends meetings  of clubs or organizations: Not on file    Relationship status: Not on file  Other Topics Concern   Not on file  Social History Narrative   Not on file    Additional social history is that she has been very active in the community.  She serves on the cancer board at [facility].  She had worked in [employer] for 12 years from [phone].  She has worked in Pharmacologist.  She was involved with politics had worked for the [employer].  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  Family History:  The patient's family history includes Arrhythmia in her mother; Asthma in her brother, mother, and paternal aunt; Cancer in her brother and father; Emphysema in her mother.   Her father died at age 39 and was a physician.  He had laryngeal CA.  Mother died at 61 secondary to arrhythmias.  Her brother died at age 45 secondary to ALL and T-cell lymphoma.  ROS General: Negative; No fevers, chills, or night sweats;  HEENT: Negative; No changes in vision or hearing, sinus congestion, difficulty swallowing Pulmonary: Negative; No cough, wheezing, shortness of breath, hemoptysis Cardiovascular: Negative; No chest pain, presyncope, syncope, palpitations GI: Negative; No nausea, vomiting, diarrhea, or abdominal pain GU: Negative; No dysuria, hematuria, or difficulty voiding Musculoskeletal: Negative; no myalgias, joint pain, or weakness Hematologic/Oncology: Negative; no easy bruising, bleeding Endocrine: Negative; no heat/cold intolerance; no diabetes Neuro: Negative; no changes in balance, headaches Skin: Negative; No rashes or skin lesions Psychiatric: Negative; No behavioral problems, depression Sleep: Negative; No snoring, daytime sleepiness, hypersomnolence, bruxism, restless legs, hypnogognic hallucinations, no cataplexy Other comprehensive 14 point system review is negative.   PHYSICAL EXAM:   VS:  BP 119/72     Pulse 81    Temp (!) 97.3 F (36.3 C)    Ht 5' 4.5" (1.638 m)    Wt 164 lb 3.2 oz (74.5 kg)    SpO2 97%    BMI 27.75  kg/m     Repeat blood pressure by me was 114/70  Wt Readings from Last 3 Encounters:  07/29/18 164 lb 3.2 oz (74.5 kg)  06/09/18 168 lb (76.2 kg)  05/24/18 175 lb (79.4 kg)      Physical Exam BP 119/72    Pulse 81    Temp (!) 97.3 F (36.3 C)    Ht 5' 4.5" (1.638 m)    Wt 164 lb 3.2 oz (74.5 kg)    SpO2 97%    BMI 27.75 kg/m  General: Alert, oriented, no distress.  Skin: normal turgor, no rashes, warm and dry HEENT: Normocephalic, atraumatic. Pupils equal round and reactive to light; sclera anicteric; extraocular muscles intact;  Nose without nasal septal hypertrophy Mouth/Parynx benign; Mallinpatti scale previously noted to be 3 Neck: No JVD, no carotid bruits; normal carotid upstroke Lungs: clear to ausculatation and percussion; no wheezing or rales Chest wall: without tenderness to palpitation Heart: PMI not displaced, RRR, s1 s2 normal, 1/6 systolic murmur, no diastolic murmur, no rubs, gallops, thrills, or heaves Abdomen: soft, nontender; no hepatosplenomehaly, BS+; abdominal aorta nontender and not dilated by palpation. Back: no CVA tenderness Pulses 2+ Musculoskeletal: full range of motion, normal strength, no joint deformities Extremities: no clubbing cyanosis or edema, Homan's sign negative  Neurologic: grossly nonfocal; Cranial nerves grossly wnl Psychologic: Normal mood and affect   Studies/Labs Reviewed:   EKG:  EKG is  ordered today.  ECG (independently read by me): Normal sinus rhythm at 74 bpm.  Borderline first-degree AV block with minimal to 202 ms.  QTc interval normal at 437 ms.  October 2019 ECG (independently read by me): Sinus rhythm 84 bpm.  Borderline first-degree AV block with a PR interval 206 ms.  Mild left axis deviation.  No ectopy.  August 31, 2017 ECG (independently read by me): Sinus rhythm at 76 bpm.  Right superior axis.  Normal  intervals.  No ectopy.  No ST segment changes.  Recent Labs: BMP Latest Ref Rng & Units 07/22/2018 05/12/2018 09/15/2017  Glucose 65 - 99 mg/dL 111(H) 137(H) 150(H)  BUN 8 - 27 mg/dL _0 Creatinine 0.57 - 1.00 mg/dL 0.67 0.57 0.58  BUN/Creat Ratio 12 - _1 31(H)  Sodium 134 - 144 mmol/L 135 141 137  Potassium 3.5 - 5.2 mmol/L 4.3 5.0 4.7  Chloride 96 - 106 mmol/L 101 102 101  CO2 20 - 29 mmol/L _2 Calcium 8.7 - 10.3 mg/dL 9.6 9.7 9.6     Hepatic Function Latest Ref Rng & Units 07/22/2018 05/12/2018 06/21/2010  Total Protein 6.0 - 8.5 g/dL 6.4 7.0 6.5  Albumin 3.8 - 4.8 g/dL 4.7 4.6 4.0  AST 0 - 40 IU/L _3 ALT 0 - 32 IU/L 19 31 35  Alk Phosphatase 39 - 117 IU/L 38(L) 84 75  Total Bilirubin 0.0 - 1.2 mg/dL 0.5 0.3 0.5  Bilirubin, Direct 0.0 - 0.3 mg/dL - - 0.1    CBC Latest Ref Rng & Units 06/11/2018 06/21/2010 01/09/2009  WBC 4.0 - 10.5 K/uL 5.3 3.9(L) -  Hemoglobin 12.0 - 15.0 g/dL 12.8 13.3 13.6  Hematocrit 36.0 - 46.0 % 37.5 38.3 -  Platelets 150 - 400 K/uL 279 221.0 -   Lab Results  Component Value Date   MCV 93.5 06/11/2018   MCV 90.5 06/21/2010   Lab Results  Component Value Date   TSH 2.60 06/21/2010   No results found for: HGBA1C   BNP No  results found for: BNP  ProBNP No results found for: PROBNP   Lipid Panel     Component Value Date/Time   CHOL 118 07/22/2018 0900   TRIG 83 07/22/2018 0900   HDL 42 07/22/2018 0900   CHOLHDL 2.8 07/22/2018 0900   CHOLHDL 4 06/21/2010 0811   VLDL 36.4 06/21/2010 0811   LDLCALC 59 07/22/2018 0900     RADIOLOGY: No results found.   Additional studies/ records that were reviewed today include:  I reviewed her remote record from Dr. Doreatha Lew in 2012.  I also reviewed her initial consultation at [facility] on July 28, 2017 by Dr. Elenor Quinones.  Reviewed her CT calcium score; echo Doppler study, renal duplex scan.  ASSESSMENT:    1. Essential hypertension   2. Mixed hyperlipidemia   3. Acquired  hypothyroidism   4. Pulmonary nodule, right   5. Overweight (BMI 25.0-29.9)    PLAN:   1.  Essential hypertension: She has a history of hypertension dating back to at least 2012 at which time medical therapy was initiated.  She had stopped medication for several years after she had lost significant weight.  I had reinstituted medication and at her last telemedicine visit blood pressure was still increased on olmesartan 40 mg and spironolactone 12.5 mg daily.  Since that evaluation with her significant weight loss, reduction in alcohol intake as well as increased activity her blood pressure is now excellent.  Her olmesartan dose was reduced to 20 mg and she continues to take spironolactone 12.5 mg daily.  BP today is ideal and I have recommended continuation of current therapy.  2.  Mixed hyperlipidemia: Historically she had an episode of marked hypertriglyceridemia in 2015 with levels at 1305.  Recently, with initiation of the COVID pandemic and stay at home dietary compliance was significantly less and there was consumption of increased wine compared to previously.  This resulted in significant increase in triglycerides to 914.  Since May 24, 2018 with her change in diet with 22 pounds of weight loss, initiation of the Cipro 2 capsules twice a day as well as fenofibrate and reduction in alcohol intake triglycerides now are markedly improved and ideal at 83.  3.  Overweight: She has lost 22 pounds since initiating weight watchers in late April 2020.  She feels great.  She has more energy.  BMI today is 27.7, improved from 29.6 on May 24, 2018.  4. Hypothyroidism, she continues to be on levothyroxine.  5.  Pulmonary nodule: Followed by Dr. Elenor Quinones at [facility].  Recently told that this is stable and does not need further evaluation.  6.  Vaginal bleeding: Status post hysteroscopy with polyp resection, pathology benign.   She will be following up with Dr. Virgina Jock in December, I will see her in March  2021 for follow-up evaluation  Medication Adjustments/Labs and Tests Ordered: Current medicines are reviewed at length with the patient today.  Concerns regarding medicines are outlined above.  Medication changes, Labs and Tests ordered today are listed in the Patient Instructions below. Patient Instructions  Medication Instructions:  The current medical regimen is effective;  continue present plan and medications.  If you need a refill on your cardiac medications before your next appointment, please call your pharmacy.   Follow-Up: At [facility] you and your health needs are our priority.  As part of our continuing mission to provide you with exceptional heart care, we have created designated Provider Care Teams.  These Care Teams include your primary Cardiologist (physician)  and Advanced Practice Providers (APPs -  Physician Assistants and Nurse Practitioners) who all work together to provide you with the care you need, when you need it. You will need a follow up appointment in 8 months.  Please call our office 2 months in advance to schedule this appointment.  You may see Dr.Makayle Krahn or one of the following Advanced Practice Providers on your designated Care Team: Almyra Deforest, PA-C  Fabian Sharp, [place]        Signed, Shelva Majestic, MD  07/31/2018 8:50 AM    [place] [address] [place], [place], [place]  [address] Phone: [phone]

## 2018-07-31 ENCOUNTER — Encounter: Payer: Self-pay | Admitting: Cardiovascular Disease

## 2018-08-02 ENCOUNTER — Other Ambulatory Visit (INDEPENDENT_AMBULATORY_CARE_PROVIDER_SITE_OTHER): Payer: BC Managed Care – PPO

## 2018-08-02 DIAGNOSIS — I10 Essential (primary) hypertension: Secondary | ICD-10-CM

## 2018-08-02 DIAGNOSIS — E782 Mixed hyperlipidemia: Secondary | ICD-10-CM | POA: Diagnosis not present

## 2018-08-02 DIAGNOSIS — E039 Hypothyroidism, unspecified: Secondary | ICD-10-CM | POA: Diagnosis not present

## 2018-08-26 ENCOUNTER — Telehealth: Payer: Self-pay

## 2018-08-26 NOTE — Telephone Encounter (Signed)
   [facility] Pre-operative Risk Assessment    Request for surgical clearance:  1. What type of surgery is being performed?  RIGHT TOTAL SHOULDER ARTHROPLASTY    2. When is this surgery scheduled? TBD   3. What type of clearance is required (medical clearance vs. Pharmacy clearance to hold med vs. Both)? MEDICAL  4. Are there any medications that need to be held prior to surgery and how long? NONE LISTED   5. Practice name and name of physician performing surgery? [facility] Suella Broad, MD  ATTN:ASHLEY HILTON   6. What is your office phone number  [phone]      7.   What is your office fax number  [phone]  8.   Anesthesia type (None, local, MAC, general) ? GENERAL   Waylan Rocher 08/26/2018, 10:33 AM  _________________________________________________________________   (provider comments below)

## 2018-08-26 NOTE — Telephone Encounter (Signed)
   Primary Cardiologist: Shelva Majestic, MD  Chart reviewed as part of pre-operative protocol coverage. Patient was contacted 08/26/2018 in reference to pre-operative risk assessment for pending surgery as outlined below.  Megan Savage was last seen on 7/2/220 with h/o HTN, hypothyroidism, obesity, HLD, pulmonary nodule, mild RAS, 70% stenosis of celiac artery, overweight. Echo 08/2017 with normal LVEF and calcium score of zero in 06/2017. Revised cardiac risk index is 0.4% indicating very low risk of CV events. She affirms has been doing well without any new cardiac symptoms - able to achieve >4 METS without angina. Therefore, based on ACC/AHA guidelines, the patient would be at acceptable risk for the planned procedure without further cardiovascular testing.   The clearance does not indicate any specific request to hold aspirin therefore would continue perioperatively if possible. However, if it requires holding for the procedure, the patient may hold aspirin or 7 days prior to surgery as she does not have any history of PCI, bypass, CAD, or stroke.  I will route this recommendation to the requesting party via Epic fax function and remove from pre-op pool.  Please call with questions.  Charlie Pitter, PA-C 08/26/2018, 11:17 AM

## 2018-08-30 ENCOUNTER — Other Ambulatory Visit: Payer: Self-pay | Admitting: Cardiovascular Disease

## 2018-09-02 NOTE — H&P (Signed)
Patient's anticipated LOS is less than 2 midnights, meeting these requirements: - Younger than 32 - Lives within 1 hour of care - Has a competent adult at home to recover with post-op recover - NO history of  - Chronic pain requiring opiods  - Diabetes  - Coronary Artery Disease  - Heart failure  - Heart attack  - Stroke  - DVT/VTE  - Cardiac arrhythmia  - Respiratory Failure/COPD  - Renal failure  - Anemia  - Advanced Liver disease       Megan Savage is an 63 y.o. female.    Chief Complaint: right shoulder pain  HPI: Pt is a 63 y.o. female complaining of right shoulder pain for multiple years. Pain had continually increased since the beginning. X-rays in the clinic show end-stage arthritic changes of the right shoulder. Pt has tried various conservative treatments which have failed to alleviate their symptoms, including injections and therapy. Various options are discussed with the patient. Risks, benefits and expectations were discussed with the patient. Patient understand the risks, benefits and expectations and wishes to proceed with surgery.   PCP:  Shon Baton, MD  D/C Plans: Home  PMH: Past Medical History:  Diagnosis Date  . Depression   . Fatigue   . Hypercholesterolemia   . Hyperlipidemia   . Hypertension    controlled  . Hypothyroidism   . Insomnia   . Obesity   . Skin disorder     PSH: Past Surgical History:  Procedure Laterality Date  . APPENDECTOMY  1976  . BREAST IMPLANT REMOVAL Bilateral    implanted 1996 removed 2002  . ENDOMETRIAL FULGURATION  1992  . HYSTEROSCOPY W/D&C N/A 06/15/2018   Procedure: DILATATION AND CURETTAGE /HYSTEROSCOPY;  Surgeon: Dian Queen, MD;  Location: [facility];  Service: Gynecology;  Laterality: N/A;  . OOPHORECTOMY  1976    Social History:  reports that she has never smoked. She has never used smokeless tobacco. She reports current alcohol use. She reports that she does not use drugs.   Allergies:  No Known Allergies  Medications: No current facility-administered medications for this encounter.    Current Outpatient Medications  Medication Sig Dispense Refill  . aspirin 81 MG tablet Take 81 mg by mouth daily.      . calcium citrate-vitamin D 200-200 MG-UNIT TABS Take 2 tablets by mouth daily.     Marland Kitchen estradiol (VIVELLE-DOT) 0.025 MG/24HR Place 1 patch onto the skin 2 (two) times a week.      . ezetimibe (ZETIA) 10 MG tablet Take 1 tablet (10 mg total) by mouth daily. 90 tablet 3  . fenofibrate (TRICOR) 145 MG tablet Take 1 tablet (145 mg total) by mouth daily. 90 tablet 1  . Flaxseed, Linseed, (FLAXSEED OIL PO) Take 2 capsules by mouth daily. ON HOLD    . levothyroxine (SYNTHROID, LEVOTHROID) 25 MCG tablet Take 1 tablet (25 mcg total) by mouth daily. 30 tablet 1  . MedroxyPROGESTERone Acetate (PROVERA PO) Take by mouth. Days 1-10 of every month    . multivitamin (THERAGRAN) per tablet Take 1 tablet by mouth daily.      Marland Kitchen olmesartan (BENICAR) 40 MG tablet TAKE ONE TABLET DAILY 30 tablet 5  . spironolactone (ALDACTONE) 25 MG tablet TAKE ONE-HALF TABLET DAILY 15 tablet 5  . TURMERIC CURCUMIN PO Take 1,000 mg by mouth.    Marland Kitchen VASCEPA 1 g CAPS TAKE TWO CAPSULES TWICE DAILY 120 capsule 2  . Vitamin D, Ergocalciferol, (DRISDOL) 50000 units CAPS  capsule Take 50,000 Units by mouth every 7 (seven) days.      No results found for this or any previous visit (from the past 48 hour(s)). No results found.  ROS: Pain with rom of the right upper extremity  Physical Exam: Alert and oriented 63 y.o. female in no acute distress Cranial nerves 2-12 intact Cervical spine: full rom with no tenderness, nv intact distally Chest: active breath sounds bilaterally, no wheeze rhonchi or rales Heart: regular rate and rhythm, no murmur Abd: non tender non distended with active bowel sounds Hip is stable with rom  Right shoulder crepitus and pain with rom nv intact distally No rashes or edema   Assessment/Plan Assessment: right shoulder end stage osteoarthritis  Plan:  Patient will undergo a right total shoulder by Dr. Veverly Fells at [facility]. Risks benefits and expectations were discussed with the patient. Patient understand risks, benefits and expectations and wishes to proceed. Preoperative templating of the joint replacement has been completed, documented, and submitted to the Operating Room personnel in order to optimize intra-operative equipment management.   Merla Riches PA-C, MPAS [facility] is now [employer] [address], [place], [place], [place] [address] Phone: [phone] [email or web address] Facebook  [employer]

## 2018-09-06 ENCOUNTER — Other Ambulatory Visit: Payer: Self-pay | Admitting: Orthopedic Surgery

## 2018-09-06 DIAGNOSIS — M25551 Pain in right hip: Secondary | ICD-10-CM

## 2018-09-14 ENCOUNTER — Other Ambulatory Visit (HOSPITAL_COMMUNITY)
Admission: RE | Admit: 2018-09-14 | Discharge: 2018-09-14 | Disposition: A | Discharge status: Home / Self Care | Payer: BC Managed Care – PPO | Source: Ambulatory Visit | Attending: Orthopedic Surgery | Admitting: Orthopedic Surgery

## 2018-09-14 DIAGNOSIS — Z20828 Contact with and (suspected) exposure to other viral communicable diseases: Secondary | ICD-10-CM | POA: Insufficient documentation

## 2018-09-14 DIAGNOSIS — Z01812 Encounter for preprocedural laboratory examination: Secondary | ICD-10-CM | POA: Insufficient documentation

## 2018-09-14 LAB — SARS CORONAVIRUS 2 (TAT 6-24 HRS): SARS Coronavirus 2: NEGATIVE

## 2018-09-14 NOTE — Patient Instructions (Addendum)
YOU HAD A COVID 19 TEST ON 09-14-2018. ONCE YOUR COVID TEST IS COMPLETED, PLEASE BEGIN THE QUARANTINE INSTRUCTIONS AS OUTLINED IN YOUR HANDOUT.                KHAMIA STAMBAUGH    Your procedure is scheduled on: 09-17-2018    Report to [facility] Main  Entrance    Report to admitting at 10:15 AM   1 VISITOR IS ALLOWED TO WAIT IN WAITING ROOM  ONLY DAY OF YOUR SURGERY. NO VISITORS ARE ALLOWED IN SHORT STAY OR RECOVERY ROOM.   Call this number if you have problems the morning of surgery [phone]    Remember: [place], NO CHEWING GUM CANDY OR MINTS.   NO SOLID FOOD AFTER MIDNIGHT THE NIGHT PRIOR TO SURGERY. NOTHING BY MOUTH EXCEPT CLEAR LIQUIDS UNTIL 940AM. PLEASE FINISH ENSURE DRINK PER SURGEON ORDER  WHICH NEEDS TO BE COMPLETED AT  940 AM .   CLEAR LIQUID DIET   Foods Allowed                                                                     Foods Excluded  Coffee and tea, regular and decaf                             liquids that you cannot  Plain Jell-O any favor except red or purple                                           see through such as: Fruit ices (not with fruit pulp)                                     milk, soups, orange juice  Iced Popsicles                                    All solid food Carbonated beverages, regular and diet                                    Cranberry, grape and apple juices Sports drinks like Gatorade Lightly seasoned clear broth or consume(fat free) Sugar, honey syrup  Sample Menu Breakfast                                Lunch                                     Supper Cranberry juice                    Beef broth  Chicken broth Jell-O                                     Grape juice                           Apple juice Coffee or tea                        Jell-O                                      Popsicle                                                 Coffee or tea                        Coffee or tea  _____________________________________________________________________     Take these medicines the morning of surgery with A SIP OF WATER: ezetimibe, fenofibrate, levothyroxine , Vascepa                                 You may not have any metal on your body including hair pins and              piercings  Do not wear jewelry, make-up, lotions, powders or perfumes, deodorant             Do not wear nail polish.  Do not shave  48 hours prior to surgery.               Do not bring valuables to the hospital. [place].  Contacts, dentures or bridgework may not be worn into surgery.  Leave suitcase in the car. After surgery it may be brought to your room.      _____________________________________________________________________             [facility] - Preparing for Surgery Before surgery, you can play an important role.  Because skin is not sterile, your skin needs to be as free of germs as possible.  You can reduce the number of germs on your skin by washing with CHG (chlorahexidine gluconate) soap before surgery.  CHG is an antiseptic cleaner which kills germs and bonds with the skin to continue killing germs even after washing. Please DO NOT use if you have an allergy to CHG or antibacterial soaps.  If your skin becomes reddened/irritated stop using the CHG and inform your nurse when you arrive at Short Stay. Do not shave (including legs and underarms) for at least 48 hours prior to the first CHG shower.  You may shave your face/neck. Please follow these instructions carefully:  1.  Shower with CHG Soap the night before surgery and the  morning of Surgery.  2.  If you choose to wash your hair, wash your hair first as usual with your  normal  shampoo.  3.  After you shampoo, rinse your hair  and body thoroughly to remove the  shampoo.                           4.  Use CHG as you  would any other liquid soap.  You can apply chg directly  to the skin and wash                       Gently with a scrungie or clean washcloth.  5.  Apply the CHG Soap to your body ONLY FROM THE NECK DOWN.   Do not use on face/ open                           Wound or open sores. Avoid contact with eyes, ears mouth and genitals (private parts).                       Wash face,  Genitals (private parts) with your normal soap.             6.  Wash thoroughly, paying special attention to the area where your surgery  will be performed.  7.  Thoroughly rinse your body with warm water from the neck down.  8.  DO NOT shower/wash with your normal soap after using and rinsing off  the CHG Soap.                9.  Pat yourself dry with a clean towel.            10.  Wear clean pajamas.            11.  Place clean sheets on your bed the night of your first shower and do not  sleep with pets. Day of Surgery : Do not apply any lotions/deodorants the morning of surgery.  Please wear clean clothes to the hospital/surgery center.  FAILURE TO FOLLOW THESE INSTRUCTIONS MAY RESULT IN THE CANCELLATION OF YOUR SURGERY PATIENT SIGNATURE_________________________________  NURSE SIGNATURE__________________________________  ________________________________________________________________________   Adam Phenix  An incentive spirometer is a tool that can help keep your lungs clear and active. This tool measures how well you are filling your lungs with each breath. Taking long deep breaths may help reverse or decrease the chance of developing breathing (pulmonary) problems (especially infection) following:  A long period of time when you are unable to move or be active. BEFORE THE PROCEDURE   If the spirometer includes an indicator to show your best effort, your nurse or respiratory therapist will set it to a desired goal.  If possible, sit up straight or lean slightly forward. Try not to slouch.  Hold  the incentive spirometer in an upright position. INSTRUCTIONS FOR USE  1. Sit on the edge of your bed if possible, or sit up as far as you can in bed or on a chair. 2. Hold the incentive spirometer in an upright position. 3. Breathe out normally. 4. Place the mouthpiece in your mouth and seal your lips tightly around it. 5. Breathe in slowly and as deeply as possible, raising the piston or the ball toward the top of the column. 6. Hold your breath for 3-5 seconds or for as long as possible. Allow the piston or ball to fall to the bottom of the column. 7. Remove the mouthpiece from your mouth and breathe out normally. 8. Rest for a few seconds and repeat Steps  1 through 7 at least 10 times every 1-2 hours when you are awake. Take your time and take a few normal breaths between deep breaths. 9. The spirometer may include an indicator to show your best effort. Use the indicator as a goal to work toward during each repetition. 10. After each set of 10 deep breaths, practice coughing to be sure your lungs are clear. If you have an incision (the cut made at the time of surgery), support your incision when coughing by placing a pillow or rolled up towels firmly against it. Once you are able to get out of bed, walk around indoors and cough well. You may stop using the incentive spirometer when instructed by your caregiver.  RISKS AND COMPLICATIONS  Take your time so you do not get dizzy or light-headed.  If you are in pain, you may need to take or ask for pain medication before doing incentive spirometry. It is harder to take a deep breath if you are having pain. AFTER USE  Rest and breathe slowly and easily.  It can be helpful to keep track of a log of your progress. Your caregiver can provide you with a simple table to help with this. If you are using the spirometer at home, follow these instructions: [place] IF:   You are having difficultly using the spirometer.  You have trouble  using the spirometer as often as instructed.  Your pain medication is not giving enough relief while using the spirometer.  You develop fever of 100.5 F (38.1 C) or higher. SEEK IMMEDIATE MEDICAL CARE IF:   You cough up bloody sputum that had not been present before.  You develop fever of 102 F (38.9 C) or greater.  You develop worsening pain at or near the incision site. MAKE SURE YOU:   Understand these instructions.  Will watch your condition.  Will get help right away if you are not doing well or get worse. Document Released: 05/26/2006 Document Revised: 04/07/2011 Document Reviewed: 07/27/2006 [facility] Patient Information 2014 [place], [place].   ________________________________________________________________________

## 2018-09-14 NOTE — Progress Notes (Signed)
CARDIAC CLEARANCE NOTE DAYNA DUNN PA 08-26-18 Epic ( CAN HOLD ASPIRIN X 7 DAYS IF NEEDED PER NOTE) EKG 07-29-18 Epic CHEST CT 07-27-18 CARE EVERYHWERE ECHO 10-14-17 EPIC

## 2018-09-15 ENCOUNTER — Encounter (HOSPITAL_COMMUNITY)
Admission: RE | Admit: 2018-09-15 | Discharge: 2018-09-15 | Disposition: A | Discharge status: Home / Self Care | Payer: BC Managed Care – PPO | Source: Ambulatory Visit | Attending: Orthopedic Surgery | Admitting: Orthopedic Surgery

## 2018-09-15 ENCOUNTER — Other Ambulatory Visit: Payer: Self-pay

## 2018-09-15 ENCOUNTER — Encounter (HOSPITAL_COMMUNITY): Payer: Self-pay

## 2018-09-15 DIAGNOSIS — M19011 Primary osteoarthritis, right shoulder: Secondary | ICD-10-CM | POA: Diagnosis not present

## 2018-09-15 DIAGNOSIS — Z01812 Encounter for preprocedural laboratory examination: Secondary | ICD-10-CM | POA: Insufficient documentation

## 2018-09-15 HISTORY — DX: Unspecified osteoarthritis, unspecified site: M19.90

## 2018-09-15 LAB — SURGICAL PCR SCREEN
MRSA, PCR: NEGATIVE
Staphylococcus aureus: NEGATIVE

## 2018-09-15 LAB — CBC
HCT: 35.9 % — ABNORMAL LOW (ref 36.0–46.0)
Hemoglobin: 11.7 g/dL — ABNORMAL LOW (ref 12.0–15.0)
MCH: 30.5 pg (ref 26.0–34.0)
MCHC: 32.6 g/dL (ref 30.0–36.0)
MCV: 93.7 fL (ref 80.0–100.0)
Platelets: 230 10*3/uL (ref 150–400)
RBC: 3.83 MIL/uL — ABNORMAL LOW (ref 3.87–5.11)
RDW: 11.8 % (ref 11.5–15.5)
WBC: 4 10*3/uL (ref 4.0–10.5)
nRBC: 0 % (ref 0.0–0.2)

## 2018-09-15 LAB — BASIC METABOLIC PANEL
Anion gap: 7 (ref 5–15)
BUN: 14 mg/dL (ref 8–23)
CO2: 23 mmol/L (ref 22–32)
Calcium: 9.6 mg/dL (ref 8.9–10.3)
Chloride: 108 mmol/L (ref 98–111)
Creatinine, Ser: 0.57 mg/dL (ref 0.44–1.00)
GFR calc Af Amer: >60 mL/min (ref >60)
GFR calc non Af Amer: >60 mL/min (ref >60)
Glucose, Bld: 102 mg/dL — ABNORMAL HIGH (ref 70–99)
Potassium: 4.2 mmol/L (ref 3.5–5.1)
Sodium: 138 mmol/L (ref 135–145)

## 2018-09-16 NOTE — Progress Notes (Signed)
Anesthesia Chart Review   Case: [id number] Date/Time: 09/17/18 1228   Procedure: TOTAL SHOULDER ARTHROPLASTY anatomical (Right )   Anesthesia type: General   Pre-op diagnosis: Right shoulder osteoarthritis   [facility] ROOM 06 / [facility]   Surgeon: Netta Cedars, MD      DISCUSSION:63 y.o. never smoker with h/o HTN, HLD, depression, hypothyroidism, right shoulder OA scheduled for above procedure 09/17/2018 with Dr. Netta Cedars.   Pt cleared by cardiology 08/26/2018.  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 Echo 08/2017 with normal LVEF and calcium score of zero in 06/2017. Revised cardiac risk index is 0.4% indicating very low risk of CV events. She affirms has been doing well without any new cardiac symptoms - able to achieve >4 METS without angina. Therefore, based on ACC/AHA guidelines, the patient would be at acceptable risk for the planned procedure without further cardiovascular testing.  The clearance does not indicate any specific request to hold aspirin therefore would continue perioperatively if possible. However, if it requires holding for the procedure, the patient may hold aspirin or 7 days prior to surgery as she does not have any history of PCI, bypass, CAD, or stroke."  Anticipate pt can proceed with planned procedure barring acute status change.   VS: BP 112/61   Pulse 69   Temp 36.9 C (Oral)   Resp 16   Ht 5' 4.5" (1.638 m)   Wt 66.2 kg   SpO2 99%   BMI 24.67 kg/m   PROVIDERS: Shon Baton, MD is PCP   Shelva Majestic, MD is Cardiologist  LABS: Labs reviewed: Acceptable for surgery. (all labs ordered are listed, but only abnormal results are displayed)  Labs Reviewed  BASIC METABOLIC PANEL - Abnormal; Notable for the following components:      Result Value   Glucose, Bld 102 (*)    All other components within normal limits  CBC -  Abnormal; Notable for the following components:   RBC 3.83 (*)    Hemoglobin 11.7 (*)    HCT 35.9 (*)    All other components within normal limits  SURGICAL PCR SCREEN     IMAGES:   EKG: 07/29/2018 Rate 74 bpm Normal sinus rhythm  Right axis deviation  Pulmonary disease pattern   CV: Echo 09/03/2017 Study Conclusions  - Left ventricle: The cavity size was normal. There was mild   concentric hypertrophy. Systolic function was normal. The   estimated ejection fraction was in the range of 60% to 65%. Wall   motion was normal; there were no regional wall motion   abnormalities. Doppler parameters are consistent with abnormal   left ventricular relaxation (grade 1 diastolic dysfunction).   Doppler parameters are consistent with high ventricular filling   pressure. - Aortic valve: Transvalvular velocity was within the normal range.   There was no stenosis. There was mild regurgitation. - Mitral valve: Transvalvular velocity was within the normal range.   There was no evidence for stenosis. There was no regurgitation. - Left atrium: The atrium was mildly dilated. - Right ventricle: The cavity size was normal. Wall thickness was   normal. Systolic function was normal. - Atrial septum: No defect or patent foramen ovale was identified   by color flow Doppler. - Tricuspid valve: There was no regurgitation. Past Medical History:  Diagnosis Date  . Arthritis   . Depression    denies   .  Fatigue    resolved   . Hypercholesterolemia   . Hyperlipidemia    controlled   . Hypertension    controlled  . Hypothyroidism   . Insomnia   . Obesity   . Skin disorder    rosacea    Past Surgical History:  Procedure Laterality Date  . APPENDECTOMY  1976  . BREAST IMPLANT REMOVAL Bilateral    implanted 1996 removed 2002  . CARPAL TUNNEL RELEASE     bilateral   . ENDOMETRIAL FULGURATION  1992  . foot surgery      tumor removed (left)   . HYSTEROSCOPY W/D&C N/A 06/15/2018    Procedure: DILATATION AND CURETTAGE /HYSTEROSCOPY;  Surgeon: Dian Queen, MD;  Location: [facility];  Service: Gynecology;  Laterality: N/A;  . hysterscopy     . OOPHORECTOMY  1976   left ovary removed     MEDICATIONS: . aspirin 81 MG tablet  . calcium citrate-vitamin D 200-200 MG-UNIT TABS  . cholecalciferol (VITAMIN D3) 25 MCG (1000 UT) tablet  . estradiol (VIVELLE-DOT) 0.025 MG/24HR  . ezetimibe (ZETIA) 10 MG tablet  . fenofibrate (TRICOR) 145 MG tablet  . levothyroxine (SYNTHROID) 75 MCG tablet  . metroNIDAZOLE (METROCREAM) 0.75 % cream  . multivitamin (THERAGRAN) per tablet  . naphazoline-pheniramine (NAPHCON-A) 0.025-0.3 % ophthalmic solution  . olmesartan (BENICAR) 40 MG tablet  . progesterone (PROMETRIUM) 100 MG capsule  . spironolactone (ALDACTONE) 25 MG tablet  . TURMERIC CURCUMIN PO  . VASCEPA 1 g CAPS  . vitamin C (ASCORBIC ACID) 500 MG tablet  . Vitamin D, Ergocalciferol, (DRISDOL) 50000 units CAPS capsule   No current facility-administered medications for this encounter.    Maia Plan [facility] Pre-Surgical Testing [phone] 09/16/18 1:58 PM

## 2018-09-16 NOTE — Anesthesia Preprocedure Evaluation (Addendum)
Anesthesia Evaluation  Patient identified by MRN, date of birth, ID band Patient awake    Reviewed: Allergy & Precautions, NPO status , Patient's Chart, lab work & pertinent test results  Airway Mallampati: I  TM Distance: >3 FB Neck ROM: Full    Dental  (+) Teeth Intact, Dental Advisory Given   Pulmonary neg pulmonary ROS,    breath sounds clear to auscultation       Cardiovascular hypertension,  Rhythm:Regular Rate:Normal     Neuro/Psych Depression negative neurological ROS     GI/Hepatic negative GI ROS, Neg liver ROS,   Endo/Other  Hypothyroidism   Renal/GU negative Renal ROS     Musculoskeletal   Abdominal Normal abdominal exam  (+)   Peds  Hematology   Anesthesia Other Findings - HLD  Reproductive/Obstetrics                           Anesthesia Physical Anesthesia Plan  ASA: II  Anesthesia Plan: General   Post-op Pain Management: GA combined w/ Regional for post-op pain   Induction: Intravenous  PONV Risk Score and Plan: 4 or greater and Ondansetron, Dexamethasone, Midazolam and Treatment may vary due to age or medical condition  Airway Management Planned: Oral ETT  Additional Equipment: None  Intra-op Plan:   Post-operative Plan: Extubation in OR  Informed Consent: I have reviewed the patients History and Physical, chart, labs and discussed the procedure including the risks, benefits and alternatives for the proposed anesthesia with the patient or authorized representative who has indicated his/her understanding and acceptance.     Dental advisory given  Plan Discussed with: CRNA  Anesthesia Plan Comments: (See PAT note 09/15/2018, Konrad Felix, PA-C)       Anesthesia Quick Evaluation

## 2018-09-17 ENCOUNTER — Other Ambulatory Visit: Payer: Self-pay

## 2018-09-17 ENCOUNTER — Inpatient Hospital Stay (HOSPITAL_COMMUNITY): Payer: BC Managed Care – PPO | Admitting: Physician Assistant

## 2018-09-17 ENCOUNTER — Encounter (HOSPITAL_COMMUNITY)
Admission: RE | Disposition: A | Discharge status: Home / Self Care | Payer: Self-pay | Source: Home / Self Care | Attending: Orthopedic Surgery

## 2018-09-17 ENCOUNTER — Inpatient Hospital Stay (HOSPITAL_COMMUNITY): Payer: BC Managed Care – PPO

## 2018-09-17 ENCOUNTER — Inpatient Hospital Stay (HOSPITAL_COMMUNITY): Payer: BC Managed Care – PPO | Admitting: Registered Nurse

## 2018-09-17 ENCOUNTER — Encounter (HOSPITAL_COMMUNITY): Payer: Self-pay | Admitting: *Deleted

## 2018-09-17 ENCOUNTER — Inpatient Hospital Stay (HOSPITAL_COMMUNITY)
Admission: RE | Admit: 2018-09-17 | Discharge: 2018-09-18 | DRG: 483 | Disposition: A | Discharge status: Home / Self Care | Payer: BC Managed Care – PPO | Attending: Orthopedic Surgery | Admitting: Orthopedic Surgery

## 2018-09-17 DIAGNOSIS — Z79890 Hormone replacement therapy (postmenopausal): Secondary | ICD-10-CM

## 2018-09-17 DIAGNOSIS — E785 Hyperlipidemia, unspecified: Secondary | ICD-10-CM | POA: Diagnosis present

## 2018-09-17 DIAGNOSIS — E7800 Pure hypercholesterolemia, unspecified: Secondary | ICD-10-CM | POA: Diagnosis present

## 2018-09-17 DIAGNOSIS — I10 Essential (primary) hypertension: Secondary | ICD-10-CM | POA: Diagnosis present

## 2018-09-17 DIAGNOSIS — E039 Hypothyroidism, unspecified: Secondary | ICD-10-CM | POA: Diagnosis present

## 2018-09-17 DIAGNOSIS — E669 Obesity, unspecified: Secondary | ICD-10-CM | POA: Diagnosis present

## 2018-09-17 DIAGNOSIS — G4700 Insomnia, unspecified: Secondary | ICD-10-CM | POA: Diagnosis present

## 2018-09-17 DIAGNOSIS — Z7982 Long term (current) use of aspirin: Secondary | ICD-10-CM

## 2018-09-17 DIAGNOSIS — L719 Rosacea, unspecified: Secondary | ICD-10-CM | POA: Diagnosis present

## 2018-09-17 DIAGNOSIS — M25711 Osteophyte, right shoulder: Secondary | ICD-10-CM | POA: Diagnosis present

## 2018-09-17 DIAGNOSIS — Z79899 Other long term (current) drug therapy: Secondary | ICD-10-CM

## 2018-09-17 DIAGNOSIS — M19011 Primary osteoarthritis, right shoulder: Secondary | ICD-10-CM | POA: Diagnosis present

## 2018-09-17 DIAGNOSIS — Z96611 Presence of right artificial shoulder joint: Secondary | ICD-10-CM

## 2018-09-17 HISTORY — PX: TOTAL SHOULDER ARTHROPLASTY: SHX126

## 2018-09-17 SURGERY — ARTHROPLASTY, SHOULDER, TOTAL
Anesthesia: General | Site: Shoulder | Laterality: Right

## 2018-09-17 MED ORDER — HYDROMORPHONE HCL 1 MG/ML IJ SOLN: 0.5000 mg | INTRAMUSCULAR | Status: DC | PRN

## 2018-09-17 MED ORDER — FENOFIBRATE 160 MG PO TABS
160.0000 mg | ORAL_TABLET | Freq: Every day | ORAL | Status: DC
  Administered 2018-09-18: 160 mg via ORAL
  Filled 2018-09-17: qty 1

## 2018-09-17 MED ORDER — LACTATED RINGERS IV SOLN: INTRAVENOUS | Status: DC

## 2018-09-17 MED ORDER — MULTIVITAMINS PO TABS: 1.0000 | ORAL_TABLET | Freq: Every day | ORAL | Status: DC

## 2018-09-17 MED ORDER — FENTANYL CITRATE (PF) 100 MCG/2ML IJ SOLN
INTRAMUSCULAR | Status: DC | PRN
  Administered 2018-09-17: 50 ug via INTRAVENOUS

## 2018-09-17 MED ORDER — LEVOTHYROXINE SODIUM 75 MCG PO TABS
75.0000 ug | ORAL_TABLET | Freq: Every day | ORAL | Status: DC
  Administered 2018-09-18: 75 ug via ORAL
  Filled 2018-09-17: qty 1

## 2018-09-17 MED ORDER — IRBESARTAN 150 MG PO TABS
300.0000 mg | ORAL_TABLET | Freq: Every day | ORAL | Status: DC
  Administered 2018-09-17: 300 mg via ORAL
  Filled 2018-09-17 (×2): qty 2

## 2018-09-17 MED ORDER — PHENYLEPHRINE HCL (PRESSORS) 10 MG/ML IV SOLN
INTRAVENOUS | Status: AC
  Filled 2018-09-17: qty 2

## 2018-09-17 MED ORDER — METHOCARBAMOL 500 MG IVPB - SIMPLE MED
500.0000 mg | Freq: Four times a day (QID) | INTRAVENOUS | Status: DC | PRN
  Filled 2018-09-17: qty 50

## 2018-09-17 MED ORDER — ACETAMINOPHEN 325 MG PO TABS: 325.0000 mg | ORAL_TABLET | Freq: Once | ORAL | Status: DC | PRN

## 2018-09-17 MED ORDER — METRONIDAZOLE 0.75 % EX CREA
1.0000 "application " | TOPICAL_CREAM | Freq: Two times a day (BID) | CUTANEOUS | Status: DC
  Filled 2018-09-17: qty 45

## 2018-09-17 MED ORDER — ICOSAPENT ETHYL 1 G PO CAPS: 2.0000 | ORAL_CAPSULE | Freq: Two times a day (BID) | ORAL | Status: DC

## 2018-09-17 MED ORDER — ONDANSETRON HCL 4 MG/2ML IJ SOLN
INTRAMUSCULAR | Status: DC | PRN
  Administered 2018-09-17: 4 mg via INTRAVENOUS

## 2018-09-17 MED ORDER — NAPHAZOLINE-PHENIRAMINE 0.025-0.3 % OP SOLN
1.0000 [drp] | Freq: Four times a day (QID) | OPHTHALMIC | Status: DC | PRN
  Filled 2018-09-17: qty 15

## 2018-09-17 MED ORDER — ACETAMINOPHEN 325 MG PO TABS: 325.0000 mg | ORAL_TABLET | Freq: Four times a day (QID) | ORAL | Status: DC | PRN

## 2018-09-17 MED ORDER — MIDAZOLAM HCL 2 MG/2ML IJ SOLN
1.0000 mg | INTRAMUSCULAR | Status: DC
  Administered 2018-09-17: 1.5 mg via INTRAVENOUS

## 2018-09-17 MED ORDER — CALCIUM CITRATE-VITAMIN D 200-200 MG-UNIT PO TABS: 2.0000 | ORAL_TABLET | Freq: Every day | ORAL | Status: DC

## 2018-09-17 MED ORDER — SODIUM CHLORIDE 0.9 % IV SOLN
INTRAVENOUS | Status: DC | PRN
  Administered 2018-09-17: 10 ug/min via INTRAVENOUS

## 2018-09-17 MED ORDER — ASPIRIN EC 81 MG PO TBEC
81.0000 mg | DELAYED_RELEASE_TABLET | Freq: Every day | ORAL | Status: DC
  Administered 2018-09-18: 81 mg via ORAL
  Filled 2018-09-17: qty 1

## 2018-09-17 MED ORDER — OMEGA-3-ACID ETHYL ESTERS 1 G PO CAPS
1.0000 g | ORAL_CAPSULE | Freq: Two times a day (BID) | ORAL | Status: DC
  Administered 2018-09-17: 1 g via ORAL
  Filled 2018-09-17 (×2): qty 1

## 2018-09-17 MED ORDER — HYDROMORPHONE HCL 1 MG/ML IJ SOLN: 0.2500 mg | INTRAMUSCULAR | Status: DC | PRN

## 2018-09-17 MED ORDER — METOCLOPRAMIDE HCL 5 MG PO TABS: 5.0000 mg | ORAL_TABLET | Freq: Three times a day (TID) | ORAL | Status: DC | PRN

## 2018-09-17 MED ORDER — ACETAMINOPHEN 10 MG/ML IV SOLN: 1000.0000 mg | Freq: Once | INTRAVENOUS | Status: DC | PRN

## 2018-09-17 MED ORDER — PROMETHAZINE HCL 25 MG/ML IJ SOLN: 6.2500 mg | INTRAMUSCULAR | Status: DC | PRN

## 2018-09-17 MED ORDER — OXYCODONE-ACETAMINOPHEN 5-325 MG PO TABS: 1.0000 | ORAL_TABLET | ORAL | 0 refills | Status: DC | PRN

## 2018-09-17 MED ORDER — CALCIUM CARBONATE-VITAMIN D 500-200 MG-UNIT PO TABS
1.0000 | ORAL_TABLET | Freq: Every day | ORAL | Status: DC
  Administered 2018-09-18: 1 via ORAL
  Filled 2018-09-17: qty 1

## 2018-09-17 MED ORDER — PHENOL 1.4 % MT LIQD
1.0000 | OROMUCOSAL | Status: DC | PRN
  Filled 2018-09-17: qty 177

## 2018-09-17 MED ORDER — LIDOCAINE 2% (20 MG/ML) 5 ML SYRINGE
INTRAMUSCULAR | Status: DC | PRN
  Administered 2018-09-17: 80 mg via INTRAVENOUS

## 2018-09-17 MED ORDER — LACTATED RINGERS IV SOLN
INTRAVENOUS | Status: DC
  Administered 2018-09-17: 11:00:00 via INTRAVENOUS

## 2018-09-17 MED ORDER — BUPIVACAINE LIPOSOME 1.3 % IJ SUSP
INTRAMUSCULAR | Status: DC | PRN
  Administered 2018-09-17: 10 mL via PERINEURAL

## 2018-09-17 MED ORDER — ACETAMINOPHEN 160 MG/5ML PO SOLN: 325.0000 mg | Freq: Once | ORAL | Status: DC | PRN

## 2018-09-17 MED ORDER — METHOCARBAMOL 500 MG PO TABS
500.0000 mg | ORAL_TABLET | Freq: Four times a day (QID) | ORAL | Status: DC | PRN
  Administered 2018-09-17 – 2018-09-18 (×2): 500 mg via ORAL
  Filled 2018-09-17 (×2): qty 1

## 2018-09-17 MED ORDER — EZETIMIBE 10 MG PO TABS: 10.0000 mg | ORAL_TABLET | Freq: Every day | ORAL | Status: DC

## 2018-09-17 MED ORDER — EPHEDRINE SULFATE-NACL 50-0.9 MG/10ML-% IV SOSY
PREFILLED_SYRINGE | INTRAVENOUS | Status: DC | PRN
  Administered 2018-09-17: 10 mg via INTRAVENOUS
  Administered 2018-09-17: 5 mg via INTRAVENOUS

## 2018-09-17 MED ORDER — ONDANSETRON HCL 4 MG/2ML IJ SOLN: 4.0000 mg | Freq: Four times a day (QID) | INTRAMUSCULAR | Status: DC | PRN

## 2018-09-17 MED ORDER — VITAMIN D 25 MCG (1000 UNIT) PO TABS
1000.0000 [IU] | ORAL_TABLET | Freq: Every day | ORAL | Status: DC
  Filled 2018-09-17: qty 1

## 2018-09-17 MED ORDER — DOCUSATE SODIUM 100 MG PO CAPS
100.0000 mg | ORAL_CAPSULE | Freq: Two times a day (BID) | ORAL | Status: DC
  Administered 2018-09-17 – 2018-09-18 (×2): 100 mg via ORAL
  Filled 2018-09-17 (×2): qty 1

## 2018-09-17 MED ORDER — EZETIMIBE 10 MG PO TABS
10.0000 mg | ORAL_TABLET | Freq: Every day | ORAL | Status: DC
  Administered 2018-09-18: 10 mg via ORAL
  Filled 2018-09-17: qty 1

## 2018-09-17 MED ORDER — THROMBIN (RECOMBINANT) 5000 UNITS EX SOLR
CUTANEOUS | Status: AC
  Filled 2018-09-17: qty 5000

## 2018-09-17 MED ORDER — ONDANSETRON HCL 4 MG PO TABS: 4.0000 mg | ORAL_TABLET | Freq: Four times a day (QID) | ORAL | Status: DC | PRN

## 2018-09-17 MED ORDER — SODIUM CHLORIDE 0.9 % IV SOLN
INTRAVENOUS | Status: DC
  Administered 2018-09-17: 17:00:00 via INTRAVENOUS

## 2018-09-17 MED ORDER — CEFAZOLIN SODIUM-DEXTROSE 2-4 GM/100ML-% IV SOLN
2.0000 g | Freq: Four times a day (QID) | INTRAVENOUS | Status: AC
  Administered 2018-09-17 – 2018-09-18 (×3): 2 g via INTRAVENOUS
  Filled 2018-09-17 (×3): qty 100

## 2018-09-17 MED ORDER — CHLORHEXIDINE GLUCONATE 4 % EX LIQD
60.0000 mL | Freq: Once | CUTANEOUS | Status: AC
  Administered 2018-09-17: 4 via TOPICAL

## 2018-09-17 MED ORDER — ASPIRIN 81 MG PO TABS: 81.0000 mg | ORAL_TABLET | Freq: Every day | ORAL | Status: DC

## 2018-09-17 MED ORDER — THROMBIN 5000 UNITS EX SOLR
CUTANEOUS | Status: DC | PRN
  Administered 2018-09-17: 5000 [IU] via TOPICAL

## 2018-09-17 MED ORDER — ADULT MULTIVITAMIN W/MINERALS CH
1.0000 | ORAL_TABLET | Freq: Every day | ORAL | Status: DC
  Filled 2018-09-17: qty 1

## 2018-09-17 MED ORDER — VITAMIN C 500 MG PO TABS
500.0000 mg | ORAL_TABLET | Freq: Every day | ORAL | Status: DC
  Filled 2018-09-17: qty 1

## 2018-09-17 MED ORDER — METOCLOPRAMIDE HCL 5 MG/ML IJ SOLN: 5.0000 mg | Freq: Three times a day (TID) | INTRAMUSCULAR | Status: DC | PRN

## 2018-09-17 MED ORDER — PROPOFOL 10 MG/ML IV BOLUS
INTRAVENOUS | Status: DC | PRN
  Administered 2018-09-17: 120 mg via INTRAVENOUS

## 2018-09-17 MED ORDER — METHOCARBAMOL 500 MG PO TABS: 500.0000 mg | ORAL_TABLET | Freq: Four times a day (QID) | ORAL | 1 refills | Status: DC | PRN

## 2018-09-17 MED ORDER — OXYCODONE HCL 5 MG PO TABS
5.0000 mg | ORAL_TABLET | ORAL | Status: DC | PRN
  Administered 2018-09-17: 10 mg via ORAL
  Administered 2018-09-18: 5 mg via ORAL
  Administered 2018-09-18: 10 mg via ORAL
  Filled 2018-09-17 (×3): qty 2

## 2018-09-17 MED ORDER — DEXAMETHASONE SODIUM PHOSPHATE 10 MG/ML IJ SOLN
INTRAMUSCULAR | Status: DC | PRN
  Administered 2018-09-17: 8 mg via INTRAVENOUS

## 2018-09-17 MED ORDER — BUPIVACAINE-EPINEPHRINE (PF) 0.25% -1:200000 IJ SOLN
INTRAMUSCULAR | Status: DC | PRN
  Administered 2018-09-17: 12 mL

## 2018-09-17 MED ORDER — MENTHOL 3 MG MT LOZG: 1.0000 | LOZENGE | OROMUCOSAL | Status: DC | PRN

## 2018-09-17 MED ORDER — POLYETHYLENE GLYCOL 3350 17 G PO PACK: 17.0000 g | PACK | Freq: Every day | ORAL | Status: DC | PRN

## 2018-09-17 MED ORDER — CEFAZOLIN SODIUM-DEXTROSE 2-4 GM/100ML-% IV SOLN
2.0000 g | INTRAVENOUS | Status: AC
  Administered 2018-09-17: 2 g via INTRAVENOUS
  Filled 2018-09-17: qty 100

## 2018-09-17 MED ORDER — SPIRONOLACTONE 12.5 MG HALF TABLET
12.5000 mg | ORAL_TABLET | Freq: Every day | ORAL | Status: DC
  Administered 2018-09-17: 12.5 mg via ORAL
  Filled 2018-09-17 (×2): qty 1

## 2018-09-17 MED ORDER — FENTANYL CITRATE (PF) 100 MCG/2ML IJ SOLN
50.0000 ug | INTRAMUSCULAR | Status: DC
  Administered 2018-09-17: 50 ug via INTRAVENOUS
  Filled 2018-09-17: qty 2

## 2018-09-17 MED ORDER — MEPERIDINE HCL 50 MG/ML IJ SOLN: 6.2500 mg | INTRAMUSCULAR | Status: DC | PRN

## 2018-09-17 MED ORDER — BUPIVACAINE HCL (PF) 0.5 % IJ SOLN
INTRAMUSCULAR | Status: DC | PRN
  Administered 2018-09-17: 15 mL via PERINEURAL

## 2018-09-17 MED ORDER — SUCCINYLCHOLINE CHLORIDE 200 MG/10ML IV SOSY
PREFILLED_SYRINGE | INTRAVENOUS | Status: DC | PRN
  Administered 2018-09-17: 100 mg via INTRAVENOUS

## 2018-09-17 MED ORDER — EPHEDRINE 5 MG/ML INJ
INTRAVENOUS | Status: AC
  Filled 2018-09-17: qty 10

## 2018-09-17 MED ORDER — FENTANYL CITRATE (PF) 100 MCG/2ML IJ SOLN
INTRAMUSCULAR | Status: AC
  Filled 2018-09-17: qty 2

## 2018-09-17 MED ORDER — VITAMIN D (ERGOCALCIFEROL) 1.25 MG (50000 UNIT) PO CAPS: 50000.0000 [IU] | ORAL_CAPSULE | ORAL | Status: DC

## 2018-09-17 SURGICAL SUPPLY — 56 items
BAG ZIPLOCK 12X15 (MISCELLANEOUS) ×2 IMPLANT
BIT DRILL 1.6MX128 (BIT) ×2 IMPLANT
BLADE SAG 18X100X1.27 (BLADE) ×2 IMPLANT
BODY PROX HUMERAL 135D S6/8 (Shoulder) ×1 IMPLANT
CEMENT HV SMART SET (Cement) ×2 IMPLANT
COVER BACK TABLE 60X90IN (DRAPES) ×2 IMPLANT
COVER SURGICAL LIGHT HANDLE (MISCELLANEOUS) ×2 IMPLANT
COVER WAND RF STERILE (DRAPES) IMPLANT
DECANTER SPIKE VIAL GLASS SM (MISCELLANEOUS) ×2 IMPLANT
DRAPE ORTHO SPLIT 77X108 STRL (DRAPES) ×2
DRAPE SHEET LG 3/4 BI-LAMINATE (DRAPES) ×2 IMPLANT
DRAPE SURG ORHT 6 SPLT 77X108 (DRAPES) ×2 IMPLANT
DRAPE U-SHAPE 47X51 STRL (DRAPES) ×2 IMPLANT
DRSG ADAPTIC 3X8 NADH LF (GAUZE/BANDAGES/DRESSINGS) ×2 IMPLANT
DRSG PAD ABDOMINAL 8X10 ST (GAUZE/BANDAGES/DRESSINGS) ×2 IMPLANT
DURAPREP 26ML APPLICATOR (WOUND CARE) ×2 IMPLANT
ELECT BLADE TIP CTD 4 INCH (ELECTRODE) ×2 IMPLANT
ELECT NEEDLE TIP 2.8 STRL (NEEDLE) ×2 IMPLANT
ELECT REM PT RETURN 15FT ADLT (MISCELLANEOUS) ×2 IMPLANT
GAUZE SPONGE 4X4 12PLY STRL (GAUZE/BANDAGES/DRESSINGS) ×2 IMPLANT
GLENOID ANCHOR PEG CROSSLK 40 (Orthopedic Implant) ×2 IMPLANT
GLOVE BIOGEL PI ORTHO PRO 7.5 (GLOVE) ×1
GLOVE BIOGEL PI ORTHO PRO SZ8 (GLOVE) ×1
GLOVE ORTHO TXT STRL SZ7.5 (GLOVE) ×2 IMPLANT
GLOVE PI ORTHO PRO STRL 7.5 (GLOVE) ×1 IMPLANT
GLOVE PI ORTHO PRO STRL SZ8 (GLOVE) ×1 IMPLANT
GLOVE SURG ORTHO 8.5 STRL (GLOVE) ×2 IMPLANT
GOWN STRL REUS W/ TWL XL LVL3 (GOWN DISPOSABLE) ×2 IMPLANT
GOWN STRL REUS W/TWL XL LVL3 (GOWN DISPOSABLE) ×2
HEAD HUMERAL ECC 44X18MM SHLDR (Head) ×2 IMPLANT
HUMERAL PROX BODY 135D S6/8 (Shoulder) ×2 IMPLANT
KIT BASIN OR (CUSTOM PROCEDURE TRAY) ×2 IMPLANT
KIT TURNOVER KIT A (KITS) IMPLANT
MANIFOLD NEPTUNE II (INSTRUMENTS) ×2 IMPLANT
NEEDLE MAYO CATGUT SZ4 (NEEDLE) ×2 IMPLANT
PACK SHOULDER (CUSTOM PROCEDURE TRAY) ×2 IMPLANT
PASSER SUT SWANSON 36MM LOOP (INSTRUMENTS) IMPLANT
PIN METAGLENE 2.5 (PIN) ×2 IMPLANT
PROTECTOR NERVE ULNAR (MISCELLANEOUS) ×2 IMPLANT
RESTRAINT HEAD UNIVERSAL NS (MISCELLANEOUS) ×2 IMPLANT
SLING ARM FOAM STRAP LRG (SOFTGOODS) ×2 IMPLANT
SMARTMIX MINI TOWER (MISCELLANEOUS) ×2
SPONGE SURGIFOAM ABS GEL 12-7 (HEMOSTASIS) ×2 IMPLANT
STEM HUMERAL SZ8 STANDARD (Stem) ×2 IMPLANT
STEM HUMERAL SZ8 STD (Stem) ×1 IMPLANT
STRIP CLOSURE SKIN 1/2X4 (GAUZE/BANDAGES/DRESSINGS) ×2 IMPLANT
SUCTION FRAZIER HANDLE 12FR (TUBING) ×1
SUCTION TUBE FRAZIER 12FR DISP (TUBING) ×1 IMPLANT
SUT FIBERWIRE #2 38 T-5 BLUE (SUTURE) ×4
SUT MNCRL AB 4-0 PS2 18 (SUTURE) ×2 IMPLANT
SUT VIC AB 0 CT1 36 (SUTURE) ×2 IMPLANT
SUT VIC AB 2-0 CT1 27 (SUTURE) ×1
SUT VIC AB 2-0 CT1 TAPERPNT 27 (SUTURE) ×1 IMPLANT
SUTURE FIBERWR #2 38 T-5 BLUE (SUTURE) ×2 IMPLANT
TOWEL OR 17X26 10 PK STRL BLUE (TOWEL DISPOSABLE) ×2 IMPLANT
TOWER SMARTMIX MINI (MISCELLANEOUS) ×1 IMPLANT

## 2018-09-17 NOTE — Op Note (Signed)
NAME: Megan Savage, Megan Savage MEDICAL RECORD [id number] ACCOUNT [id number] DATE OF BIRTH:Jan 20, 1956 FACILITY: [facility] LOCATION: [facility] PHYSICIAN:STEVEN Orlena Sheldon, MD  OPERATIVE REPORT  DATE OF PROCEDURE:  09/17/2018  PREOPERATIVE DIAGNOSIS:  Right shoulder end-stage osteoarthritis.  POSTOPERATIVE DIAGNOSIS:  Right shoulder end-stage osteoarthritis.  PROCEDURE PERFORMED:  Right anatomic total shoulder arthroplasty using DePuy Global Unite system.  ATTENDING SURGEON:  Esmond Plants, MD  ASSISTANT:  Darol Destine, [place], who was scrubbed during the entire procedure and necessary for satisfactory completion of surgery.  ANESTHESIA:  General anesthesia was used plus interscalene block.  ESTIMATED BLOOD LOSS:  Less than 150 mL.  FLUID REPLACEMENT:  1500 mL crystalloid.  INSTRUMENT COUNTS:  Correct.  COMPLICATIONS:  No complications.  ANTIBIOTICS:  Perioperative antibiotics were given.  INDICATIONS:  The patient is a 63 year old female with worsening pain and dysfunction secondary to end-stage arthritis, right shoulder.  The patient has bone-on-bone and has large osteophytes and also a large loose body present.  She has failed  conservative management including intra-articular injections, pain medication, modification of activities and therapy exercises and presents now for anatomic total shoulder arthroplasty to eliminate pain and restore function.  Informed consent obtained.  DESCRIPTION OF PROCEDURE:  After an adequate level of anesthesia was achieved, the patient was positioned in modified beach chair position.  All neurovascular structures padded appropriately.  Right shoulder correctly identified, prepped and draped in  the usual manner.  Time-out called, verifying correct patient and correct site.  We entered the patient's shoulder using a standard deltopectoral incision starting at the coracoid process and extending down to the anterior humerus.  Dissection carried  out down  through the subcutaneous tissues using Bovie electrocautery.  We identified the cephalic vein and took it laterally with the deltoid pectoralis taken medially.  Conjoined tendon identified and retracted medially.  Deep retractors placed.  We  identified the biceps tendon, whipstitched and tenodesed in situ incorporating the pectoralis tendon.  We did 2 figure-of-eight sutures for the tenodesis.  We then released the subscapularis.  It was quite thin, but still felt to be repairable.  We  released that and subperiosteally and tagged with #2 FiberWire suture in a modified Mason-Allen suture technique.  Next, we did a release of the inferior capsule progressively externally rotating.  We then placed a T-handled Crego elevator over the top  of the humeral head, protecting the rotator cuff and a large Crego medially.  We externally rotated the humerus approximately 30 degrees and performed anterior, posterior head cut flush with the rotator cuff insertion on the greater tuberosity.  We  removed that ahead and cut the bone graft for bone grafting at the end.  We removed excess osteophytes with a rongeur all the way around posteriorly.  We then subluxed the humerus posteriorly gaining 360 degree exposure of the glenoid face.  We did a  capsular and a glenoid labrum removal including the base of the biceps tendon.  Once we had identification of the borders of the glenoid face and exact identification of the inferior scapular neck, we placed our center guidepin with the guide.  This was  a very small glenoid and a 40 just fit.  We placed that guide pin.  We were happy with its positioning.  We then reamed over the guide pin down to some bleeding bone and then drilled out the central peg hole for the APG glenoid by DePuy.  Next, we  drilled our three peripheral holes with referencing off that 6 o'clock position  and were pleased that all 3 peg holes were in bone.  We irrigated thoroughly.  We then used a trial to verify  that we had good bony support and impacted that trial down.  We  then removed the trial, placed thrombin-soaked Gelfoam in the three peripheral holes, vacuum mixed the DePuy high viscosity cement on the back table and then cemented the three peripheral pegs into place.  We impacted the APG glenoid and held until all  cement was hardened.  We had great support for that APG glenoid size 40.  We then directed our attention towards the humerus again.  We reamed up to a size 8 and got endosteal cortical biting with the 8 reamer distally.  We then used a 6 and an 8 trial  broached proximally set at 30 degrees of retroversion.  Once we broached for the stem, then we selected the size 8 stem and impacted that in position with a nice fit.  We used the 44/18 eccentric dialed posteriorly to get best coverage of the bone.  We  reduced the shoulder and had excellent fit and balance.  We removed the trial components, irrigated thoroughly drilled holes in lesser tuberosity and placed #2 FiberWire suture for repair of the subscapularis.  We then used impaction grafting technique  with available bone graft and a press-fit Por[place]oat stem size 8 with a 8 metaphysis and impacted that in 30 degrees of retroversion.  Once that was in place, we selected the 44/18 eccentric head and impacted that in position giving good bony coverage.  Once we had that in place, we were pleased with that reduction and stability.  We irrigated thoroughly and repaired the subscapularis anatomically back to bone including rotator interval repair.  We had a nice balanced shoulder.  We then irrigated again  and closed deltopectoral interval with 0 Vicryl suture followed by 2-0 Vicryl for subcutaneous closure and 4-0 Monocryl for skin.  Steri-Strips applied followed by sterile dressing and a shoulder sling.  The patient tolerated the procedure well.  TN/NUANCE  D:09/17/2018 T:09/17/2018 JOB:[id number]

## 2018-09-17 NOTE — Progress Notes (Signed)
AssistedDr. Hollis with right, ultrasound guided, interscalene  block. Side rails up, monitors on throughout procedure. See vital signs in flow sheet. Tolerated Procedure well.  

## 2018-09-17 NOTE — Brief Op Note (Signed)
09/17/2018  2:27 PM  PATIENT:  Megan Savage  63 y.o. female  PRE-OPERATIVE DIAGNOSIS:  Right shoulder osteoarthritis, end stage  POST-OPERATIVE DIAGNOSIS:  Right shoulder osteoarthritis, end stage  PROCEDURE:  Procedure(s): TOTAL SHOULDER ARTHROPLASTY anatomical (Right) DePuy [employer] Unite   SURGEON:  Surgeon(s) and Role:    Netta Cedars, MD - Primary  PHYSICIAN ASSISTANT:   ASSISTANTS: [place] Bruns, PA-C   ANESTHESIA:   regional and general  EBL:  100 mL   BLOOD ADMINISTERED:none  DRAINS: none   LOCAL MEDICATIONS USED:  MARCAINE     SPECIMEN:  No Specimen  DISPOSITION OF SPECIMEN:  N/A  COUNTS:  YES  TOURNIQUET:  * No tourniquets in log *  DICTATION: .Other Dictation: Dictation Number [id number]  PLAN OF CARE: Admit to inpatient   PATIENT DISPOSITION:  PACU - hemodynamically stable.   Delay start of Pharmacological VTE agent (>24hrs) due to surgical blood loss or risk of bleeding: not applicable

## 2018-09-17 NOTE — Anesthesia Postprocedure Evaluation (Signed)
Anesthesia Post Note  Patient: Megan Savage  Procedure(s) Performed: TOTAL SHOULDER ARTHROPLASTY anatomical (Right Shoulder)     Patient location during evaluation: PACU Anesthesia Type: General Level of consciousness: awake and alert Pain management: pain level controlled Vital Signs Assessment: post-procedure vital signs reviewed and stable Respiratory status: spontaneous breathing, nonlabored ventilation, respiratory function stable and patient connected to nasal cannula oxygen Cardiovascular status: blood pressure returned to baseline and stable Postop Assessment: no apparent nausea or vomiting Anesthetic complications: no    Last Vitals:  Vitals:   09/17/18 1645 09/17/18 1726  BP: 109/90 120/67  Pulse: 73 81  Resp: 16 16  Temp: 36.7 C   SpO2: 98% 97%    Last Pain:  Vitals:   09/17/18 1645  TempSrc: Oral  PainSc: 0-No pain                 Effie Berkshire

## 2018-09-17 NOTE — Transfer of Care (Signed)
Immediate Anesthesia Transfer of Care Note  Patient: Megan Savage  Procedure(s) Performed: TOTAL SHOULDER ARTHROPLASTY anatomical (Right Shoulder)  Patient Location: PACU  Anesthesia Type:General  Level of Consciousness: awake, alert , oriented and patient cooperative  Airway & Oxygen Therapy: Patient Spontanous Breathing and Patient connected to face mask oxygen  Post-op Assessment: Report given to RN, Post -op Vital signs reviewed and stable and Patient moving all extremities  Post vital signs: Reviewed and stable  Last Vitals:  Vitals Value Taken Time  BP 127/53 09/17/18 1426  Temp    Pulse 86 09/17/18 1427  Resp 18 09/17/18 1427  SpO2 100 % 09/17/18 1427  Vitals shown include unvalidated device data.  Last Pain:  Vitals:   09/17/18 1137  TempSrc:   PainSc: 0-No pain         Complications: No apparent anesthesia complications

## 2018-09-17 NOTE — Discharge Instructions (Signed)
Ice to the shoulder constantly.  Keep the incision covered and clean and dry for one week, then ok to get it wet in the shower.  Do exercise as instructed several times per day.  DO NOT reach behind your back or push up out of a chair with the operative arm.  Use a sling while you are up and around for comfort, may remove while seated.  Keep pillow propped behind the operative elbow.  Follow up with Dr Veverly Fells in two weeks in the office, call [phone] for appt  DO NOT walk the dog..too risky!

## 2018-09-17 NOTE — Anesthesia Procedure Notes (Signed)
Procedure Name: Intubation Date/Time: 09/17/2018 11:52 AM Performed by: Victoriano Lain, CRNA Pre-anesthesia Checklist: Patient identified, Emergency Drugs available, Suction available, Patient being monitored and Timeout performed Patient Re-evaluated:Patient Re-evaluated prior to induction Oxygen Delivery Method: Circle system utilized Preoxygenation: Pre-oxygenation with 100% oxygen Induction Type: IV induction Ventilation: Mask ventilation without difficulty Laryngoscope Size: Mac and 4 Grade View: Grade I Tube type: Oral Tube size: 7.5 mm Number of attempts: 1 Airway Equipment and Method: Stylet Placement Confirmation: ETT inserted through vocal cords under direct vision,  positive ETCO2 and breath sounds checked- equal and bilateral Secured at: 21 cm Tube secured with: Tape Dental Injury: Teeth and Oropharynx as per pre-operative assessment

## 2018-09-17 NOTE — Interval H&P Note (Signed)
History and Physical Interval Note:  09/17/2018 11:04 AM  Megan Savage  has presented today for surgery, with the diagnosis of Right shoulder osteoarthritis.  The various methods of treatment have been discussed with the patient and family. After consideration of risks, benefits and other options for treatment, the patient has consented to  Procedure(s): TOTAL SHOULDER ARTHROPLASTY anatomical (Right) as a surgical intervention.  The patient's history has been reviewed, patient examined, no change in status, stable for surgery.  I have reviewed the patient's chart and labs.  Questions were answered to the patient's satisfaction.     Augustin Schooling

## 2018-09-17 NOTE — Anesthesia Procedure Notes (Signed)
Anesthesia Regional Block: Interscalene brachial plexus block   Pre-Anesthetic Checklist: ,, timeout performed, Correct Patient, Correct Site, Correct Laterality, Correct Procedure, Correct Position, site marked, Risks and benefits discussed,  Surgical consent,  Pre-op evaluation,  At surgeon's request and post-op pain management  Laterality: Right  Prep: chloraprep       Needles:  Injection technique: Single-shot  Needle Type: Echogenic Stimulator Needle     Needle Length: 9cm  Needle Gauge: 21     Additional Needles:   Procedures:,,,, ultrasound used (permanent image in chart),,,,  Narrative:  Start time: 09/17/2018 11:30 AM End time: 09/17/2018 11:40 AM Injection made incrementally with aspirations every 5 mL.  Performed by: Personally  Anesthesiologist: Effie Berkshire, MD  Additional Notes: Patient tolerated the procedure well. Local anesthetic introduced in an incremental fashion under minimal resistance after negative aspirations. No paresthesias were elicited. After completion of the procedure, no acute issues were identified and patient continued to be monitored by RN.

## 2018-09-18 LAB — BASIC METABOLIC PANEL WITH GFR
Anion gap: 10 (ref 5–15)
BUN: 18 mg/dL (ref 8–23)
CO2: 22 mmol/L (ref 22–32)
Calcium: 8.9 mg/dL (ref 8.9–10.3)
Chloride: 108 mmol/L (ref 98–111)
Creatinine, Ser: 0.56 mg/dL (ref 0.44–1.00)
GFR calc Af Amer: >60 mL/min
GFR calc non Af Amer: >60 mL/min
Glucose, Bld: 147 mg/dL — ABNORMAL HIGH (ref 70–99)
Potassium: 4 mmol/L (ref 3.5–5.1)
Sodium: 140 mmol/L (ref 135–145)

## 2018-09-18 LAB — HEMOGLOBIN AND HEMATOCRIT, BLOOD
HCT: 32.1 % — ABNORMAL LOW (ref 36.0–46.0)
Hemoglobin: 10.3 g/dL — ABNORMAL LOW (ref 12.0–15.0)

## 2018-09-18 MED ORDER — KETOROLAC TROMETHAMINE 30 MG/ML IJ SOLN
30.0000 mg | Freq: Four times a day (QID) | INTRAMUSCULAR | Status: DC
  Administered 2018-09-18: 30 mg via INTRAVENOUS
  Filled 2018-09-18: qty 1

## 2018-09-18 NOTE — Progress Notes (Signed)
Orthopedics Progress Note  Subjective: Some mild incisional pain this morning.  She also didn't sleep much due to being wired.  Objective:  Vitals:   09/18/18 0003 09/18/18 0508  BP: (!) 99/57 (!) 104/50  Pulse: 74 65  Resp: 15 15  Temp: 98.8 F (37.1 C) 98.6 F (37 C)  SpO2:      General: Awake and alert  Musculoskeletal: Right shoulder dressing changed. Incision looks good. Minimal bruising and swelling.  Neurovascularly intact  Lab Results  Component Value Date   WBC 4.0 09/15/2018   HGB 10.3 (L) 09/18/2018   HCT 32.1 (L) 09/18/2018   MCV 93.7 09/15/2018   PLT 230 09/15/2018       Component Value Date/Time   NA 140 09/18/2018 0224   NA 135 07/22/2018 0900   K 4.0 09/18/2018 0224   CL 108 09/18/2018 0224   CO2 22 09/18/2018 0224   GLUCOSE 147 (H) 09/18/2018 0224   BUN 18 09/18/2018 0224   BUN 15 07/22/2018 0900   CREATININE 0.56 09/18/2018 0224   CALCIUM 8.9 09/18/2018 0224   GFRNONAA >60 09/18/2018 0224   GFRAA >60 09/18/2018 0224    No results found for: INR, PROTIME  Assessment/Plan: POD #1 s/p Procedure(s): TOTAL SHOULDER ARTHROPLASTY anatomical Doing well this morning. Home after therapy  Doran Heater. Veverly Fells, MD 09/18/2018 7:42 AM

## 2018-09-18 NOTE — Discharge Summary (Signed)
Orthopedic Discharge Summary        Physician Discharge Summary  Patient ID: Megan Savage MRN: [id number] DOB/AGE: 09-26-1955 63 y.o.  Admit date: 09/17/2018 Discharge date: 09/18/2018   Procedures:  Procedure(s) (LRB): TOTAL SHOULDER ARTHROPLASTY anatomical (Right)  Attending Physician:  Dr. Esmond Plants  Admission Diagnoses:   Right shoulder end stage OA  Discharge Diagnoses:  same   Past Medical History:  Diagnosis Date  . Arthritis   . Depression    denies   . Fatigue    resolved   . Hypercholesterolemia   . Hyperlipidemia    controlled   . Hypertension    controlled  . Hypothyroidism   . Insomnia   . Obesity   . Skin disorder    rosacea    PCP: Shon Baton, MD   Discharged Condition: good  Hospital Course:  Patient underwent the above stated procedure on 09/17/2018. Patient tolerated the procedure well and brought to the recovery room in good condition and subsequently to the floor. Patient had an uncomplicated hospital course and was stable for discharge.   Disposition: Discharge disposition: 01-Home or Self Care      with follow up in 2 weeks   Follow-up Information    Netta Cedars, MD. Call in 2 weeks.   Specialty: Orthopedic Surgery Why: [phone] Contact information: [address] [place] [address] [phone]           Discharge Instructions    Call MD / Call 911   Complete by: As directed    If you experience chest pain or shortness of breath, CALL 911 and be transported to the hospital emergency room.  If you develope a fever above 101 F, pus (white drainage) or increased drainage or redness at the wound, or calf pain, call your surgeon's office.   Constipation Prevention   Complete by: As directed    Drink plenty of fluids.  Prune juice may be helpful.  You may use a stool softener, such as Colace (over the counter) 100 mg twice a day.  Use MiraLax (over the counter) for constipation as needed.   Diet - low sodium heart healthy   Complete by: As directed    Increase activity slowly as tolerated   Complete by: As directed       Allergies as of 09/18/2018   No Known Allergies     Medication List    TAKE these medications   aspirin 81 MG tablet Take 81 mg by mouth daily.   calcium citrate-vitamin D 200-200 MG-UNIT Tabs Take 2 tablets by mouth daily.   cholecalciferol 25 MCG (1000 UT) tablet Commonly known as: VITAMIN D3 Take 1,000 Units by mouth daily.   estradiol 0.025 MG/24HR Commonly known as: VIVELLE-DOT Place 1 patch onto the skin 2 (two) times a week.   ezetimibe 10 MG tablet Commonly known as: ZETIA Take 10 mg by mouth daily.   ezetimibe 10 MG tablet Commonly known as: ZETIA Take 1 tablet (10 mg total) by mouth daily.   fenofibrate 145 MG tablet Commonly known as: Tricor Take 1 tablet (145 mg total) by mouth daily.   levothyroxine 75 MCG tablet Commonly known as: SYNTHROID Take 75 mcg by mouth daily before breakfast.   methocarbamol 500 MG tablet Commonly known as: Robaxin Take 1 tablet (500 mg total) by mouth every 6 (six) hours as needed.   metroNIDAZOLE 0.75 % cream Commonly known as: METROCREAM Apply 1 application topically 2 (two) times daily.  multivitamin per tablet Take 1 tablet by mouth daily.   naphazoline-pheniramine 0.025-0.3 % ophthalmic solution Commonly known as: NAPHCON-A Place 1 drop into both eyes 4 (four) times daily as needed for eye irritation or allergies.   olmesartan 40 MG tablet Commonly known as: BENICAR TAKE ONE TABLET DAILY What changed: how much to take   oxyCODONE-acetaminophen 5-325 MG tablet Commonly known as: Percocet Take 1 tablet by mouth every 4 (four) hours as needed for severe pain.   progesterone 100 MG capsule Commonly known as: PROMETRIUM Take 100 mg by mouth See admin instructions. Days 1-10 of every month   spironolactone 25 MG tablet Commonly known as: ALDACTONE TAKE ONE-HALF TABLET DAILY    TURMERIC CURCUMIN PO Take 500 mg by mouth 2 (two) times daily.   Vascepa 1 g Caps Generic drug: Icosapent Ethyl TAKE TWO CAPSULES TWICE DAILY What changed: See the new instructions.   vitamin C 500 MG tablet Commonly known as: ASCORBIC ACID Take 500 mg by mouth daily.   Vitamin D (Ergocalciferol) 1.25 MG (50000 UT) Caps capsule Commonly known as: DRISDOL Take 50,000 Units by mouth every 30 (thirty) days.         Signed: Augustin Schooling 09/18/2018, 7:44 AM  [facility] is now [facility]  [place] [address] [place], [place], [place] [address] Phone: [place]

## 2018-09-18 NOTE — Evaluation (Signed)
Occupational Therapy Evaluation Patient Details Name: Megan Savage MRN: [id number] DOB: 12-10-1955 Today's Date: 09/18/2018    History of Present Illness s/p R TSA   Clinical Impression   This 63 year old female was admitted for the above sx.  Pt had decreased BP at end of session. While, everything was covered with her and handouts were given, pt had decreased attention during session. Will follow up again to make sure that she is retaining information.  Pt lives alone, will have one night of 24/7 then intermittent assistance from neighbors    Follow Up Recommendations  Follow surgeon's recommendation for DC plan and follow-up therapies;Supervision/Assistance - 24 hour(initially)    Equipment Recommendations  None recommended by OT    Recommendations for Other Services       Precautions / Restrictions Precautions Precautions: Shoulder Type of Shoulder Precautions: sling on except for bathing, dressing and exercise.  May move elbow, wrist and fingers.   May move hand to face for gentle ADLs Shoulder Interventions: Shoulder sling/immobilizer Precaution Booklet Issued: Yes (comment) Restrictions RUE Weight Bearing: Non weight bearing      Mobility Bed Mobility Overal bed mobility: Modified Independent             General bed mobility comments: HOB raised. Pt plans to sleep in recliner initially  Transfers Overall transfer level: Independent                    Balance                                           ADL either performed or assessed with clinical judgement   ADL Overall ADL's : Needs assistance/impaired Eating/Feeding: Set up   Grooming: Supervision/safety;Standing   Upper Body Bathing: Minimal assistance;Moderate assistance   Lower Body Bathing: Set up;Sit to/from stand   Upper Body Dressing : Minimal assistance;Moderate assistance   Lower Body Dressing: Set up;Sit to/from stand   Toilet Transfer: Independent    Toileting- Water quality scientist and Hygiene: Independent         General ADL Comments: ambulated to bathroom independently and completed toileting.  Pt crosses legs for LB adls.  Performed ADL and educated on shoulder precautions. Also performed wrist and finger exercises. Educated on supination and elbow ROM, but block in effect so she wasn't able to perform. Extended loop on sling for comfort     Vision         Perception     Praxis      Pertinent Vitals/Pain       Hand Dominance Right   Extremity/Trunk Assessment Upper Extremity Assessment Upper Extremity Assessment: RUE deficits/detail RUE Deficits / Details: can move wrist and fingers; block in effect           Communication Communication Communication: No difficulties   Cognition Arousal/Alertness: Awake/alert Behavior During Therapy: WFL for tasks assessed/performed Overall Cognitive Status: Impaired/Different from baseline Area of Impairment: Attention                               General Comments: decreased attention:  multiple cues not to move arm (and also not to move L during BP)   General Comments  Pt premedicated prior to OT.  reported feeling like she would pass out in chair.  BP 83/40.  RN alerted. Checked BP  a few minutes later and increased    Exercises     Shoulder Instructions      Home Living Family/patient expects to be discharged to:: Private residence Living Arrangements: Alone                 Bathroom Shower/Tub: Occupational psychologist: Handicapped height     Home Equipment: Shower seat          Prior Functioning/Environment Level of Independence: Independent        Comments: has Financial trader.  Neighbors will assist as needed        OT Problem List: Decreased strength;Decreased range of motion;Decreased activity tolerance;Decreased cognition;Pain      OT Treatment/Interventions: Self-care/ADL training;DME and/or AE  instruction;Therapeutic activities;Cognitive remediation/compensation;Patient/family education    OT Goals(Current goals can be found in the care plan section) Acute Rehab OT Goals Patient Stated Goal: return to independence OT Goal Formulation: With patient Time For Goal Achievement: 09/20/18 Potential to Achieve Goals: Good ADL Goals Additional ADL Goal #1: pt will verbalize all precautions and not need cues to keep R shoulder still during adls/mobility.  Pt will verbalize/demonstrate elbow to finger ROM  OT Frequency: Min 2X/week   Barriers to D/C:            Co-evaluation              AM-PAC OT "6 Clicks" Daily Activity     Outcome Measure Help from another person eating meals?: A Little Help from another person taking care of personal grooming?: A Little Help from another person toileting, which includes using toliet, bedpan, or urinal?: A Little Help from another person bathing (including washing, rinsing, drying)?: A Little Help from another person to put on and taking off regular upper body clothing?: A Little Help from another person to put on and taking off regular lower body clothing?: A Little 6 Click Score: 18   End of Session    Activity Tolerance: Treatment limited secondary to medical complications (Comment) Patient left: in chair;with call bell/phone within reach;with chair alarm set  OT Visit Diagnosis: Pain Pain - Right/Left: Right Pain - part of body: Shoulder                Time: [id number] OT Time Calculation (min): 40 min Charges:  OT General Charges $OT Visit: 1 Visit OT Evaluation $OT Eval Low Complexity: 1 Low OT Treatments $Self Care/Home Management : 23-37 mins  Lesle Chris, OTR/L Acute Rehabilitation Services [phone] [facility] pager [phone] office 09/18/2018  [place] 09/18/2018, 9:35 AM

## 2018-09-18 NOTE — Progress Notes (Signed)
   09/18/18 1100  OT Visit Information  Last OT Received On 09/18/18  Assistance Needed +1  History of Present Illness s/p R TSA  Precautions  Precautions Shoulder  Type of Shoulder Precautions sling on except for bathing, dressing and exercise.  May move elbow, wrist and fingers.   May move hand to face for gentle ADLs  Shoulder Interventions Shoulder sling/immobilizer  Precaution Booklet Issued Yes (comment)  Cognition  Arousal/Alertness Awake/alert  Behavior During Therapy WFL for tasks assessed/performed  Overall Cognitive Status Within Functional Limits for tasks assessed  Upper Extremity Assessment  Upper Extremity Assessment RUE deficits/detail  RUE Deficits / Details can move wrist and fingers; block in effect  ADL  Eating/Feeding Set up (cut meat)  Upper Body Bathing Minimal assistance;Sitting  Upper Body Dressing  Supervision/safety  General ADL Comments simulated washing under arm with dry cloth, donned gown as a shirt. Pt verbalizes sequence, if she wears a loose overhead shirt.  Donned sling with set up/supervision. Tried both with prefastened loosely and without doing this. Pt prefers prefastened.   Restrictions  RUE Weight Bearing NWB  Exercises  Exercises  (demonstrated elbow exercises and supination on non-operative)  OT - End of Session  Activity Tolerance Patient tolerated treatment well  Patient left in chair;with call bell/phone within reach;with chair alarm set  OT Assessment/Plan  OT Visit Diagnosis Pain  Pain - Right/Left Right  Pain - part of body Shoulder  Follow Up Recommendations Follow surgeon's recommendation for DC plan and follow-up therapies;Supervision/Assistance - 24 hour  OT Equipment None recommended by OT  AM-PAC OT "6 Clicks" Daily Activity Outcome Measure (Version 2)  Help from another person eating meals? 3  Help from another person taking care of personal grooming? 3  Help from another person toileting, which includes using toliet,  bedpan, or urinal? 3  Help from another person bathing (including washing, rinsing, drying)? 3  Help from another person to put on and taking off regular upper body clothing? 3  Help from another person to put on and taking off regular lower body clothing? 3  6 Click Score 18  OT Goal Progression  Progress towards OT goals Goals met/education completed, patient discharged from OT  OT Time Calculation  OT Start Time (ACUTE ONLY) 1114  OT Stop Time (ACUTE ONLY) 1132  OT Time Calculation (min) 18 min  OT General Charges  $OT Visit 1 Visit  OT Treatments  $Self Care/Home Management  8-22 mins  Pain:  R arm sore, "4" faces.  Monitored pain Lesle Chris, OTR/L Acute Rehabilitation Services [phone] [facility] pager [phone] office 09/18/2018

## 2018-09-18 NOTE — Plan of Care (Signed)

## 2018-09-20 ENCOUNTER — Encounter (HOSPITAL_COMMUNITY): Payer: Self-pay | Admitting: Orthopedic Surgery

## 2018-10-05 ENCOUNTER — Other Ambulatory Visit: Payer: Self-pay

## 2018-10-05 ENCOUNTER — Ambulatory Visit
Admission: RE | Admit: 2018-10-05 | Discharge: 2018-10-05 | Disposition: A | Discharge status: Home / Self Care | Payer: BC Managed Care – PPO | Source: Ambulatory Visit | Attending: Orthopedic Surgery | Admitting: Orthopedic Surgery

## 2018-10-05 DIAGNOSIS — M25551 Pain in right hip: Secondary | ICD-10-CM

## 2018-10-05 MED ORDER — IOPAMIDOL (ISOVUE-M 200) INJECTION 41%
15.0000 mL | Freq: Once | INTRAMUSCULAR | Status: AC
  Administered 2018-10-05: 15 mL via INTRA_ARTICULAR

## 2018-10-26 ENCOUNTER — Other Ambulatory Visit: Payer: Self-pay | Admitting: Cardiovascular Disease

## 2018-11-26 ENCOUNTER — Other Ambulatory Visit: Payer: Self-pay | Admitting: Cardiovascular Disease

## 2018-11-29 ENCOUNTER — Other Ambulatory Visit: Payer: Self-pay | Admitting: Cardiovascular Disease

## 2018-12-06 ENCOUNTER — Other Ambulatory Visit: Payer: Self-pay

## 2018-12-06 ENCOUNTER — Ambulatory Visit: Payer: BC Managed Care – PPO | Admitting: Podiatry

## 2018-12-06 ENCOUNTER — Encounter: Payer: Self-pay | Admitting: Podiatry

## 2018-12-06 DIAGNOSIS — L84 Corns and callosities: Secondary | ICD-10-CM | POA: Diagnosis not present

## 2018-12-06 DIAGNOSIS — M722 Plantar fascial fibromatosis: Secondary | ICD-10-CM | POA: Diagnosis not present

## 2018-12-06 DIAGNOSIS — M2042 Other hammer toe(s) (acquired), left foot: Secondary | ICD-10-CM

## 2018-12-06 DIAGNOSIS — E039 Hypothyroidism, unspecified: Secondary | ICD-10-CM | POA: Insufficient documentation

## 2018-12-08 NOTE — Progress Notes (Signed)
Subjective:   Patient ID: Megan Savage, female   DOB: 63 y.o.   MRN: [id number]   HPI Patient presents stating that she has a digital deformity of the left foot and also some history heel pain that can be bothersome and she wanted to get this checked   Review of Systems  All other systems reviewed and are negative.       Objective:  Physical Exam Vitals signs and nursing note reviewed.  Constitutional:      Appearance: She is well-developed.  Pulmonary:     Effort: Pulmonary effort is normal.  Musculoskeletal: Normal range of motion.  Skin:    General: Skin is warm.  Neurological:     Mental Status: She is alert.     Neurovascular status intact with patient noted to have inflammation fourth digit left with keratotic tissue formation mild discomfort plantar arch of the minimal nature with no other pathology noted     Assessment:  Hammertoe deformity with lesion formation of a mild to moderate nature along with inflammation of the arch mild nature     Plan:  H&P conditions reviewed and at this point I recommended trimming which was accomplished padding and the possibility for surgical intervention which I made her aware of and explained to her.  At this point we will get a hold off and just evaluate and if any symptoms were to occur patient is to let us know immediately  X-rays indicate that there does not appear to be any bone spur formation appears to be inflammatory but mild structural deformity and digital deformity noted

## 2018-12-14 ENCOUNTER — Other Ambulatory Visit: Payer: BC Managed Care – PPO | Admitting: Orthotics

## 2018-12-14 ENCOUNTER — Other Ambulatory Visit: Payer: Self-pay

## 2019-01-18 ENCOUNTER — Ambulatory Visit: Payer: BC Managed Care – PPO | Admitting: Cardiovascular Disease

## 2019-01-18 ENCOUNTER — Encounter: Payer: Self-pay | Admitting: Cardiovascular Disease

## 2019-01-18 ENCOUNTER — Other Ambulatory Visit: Payer: Self-pay

## 2019-01-18 VITALS — BP 125/70 | HR 78 | Temp 97.9°F | Ht 64.5 in | Wt 144.4 lb

## 2019-01-18 DIAGNOSIS — R911 Solitary pulmonary nodule: Secondary | ICD-10-CM | POA: Diagnosis not present

## 2019-01-18 DIAGNOSIS — E782 Mixed hyperlipidemia: Secondary | ICD-10-CM

## 2019-01-18 DIAGNOSIS — E039 Hypothyroidism, unspecified: Secondary | ICD-10-CM

## 2019-01-18 DIAGNOSIS — I10 Essential (primary) hypertension: Secondary | ICD-10-CM | POA: Diagnosis not present

## 2019-01-18 NOTE — Patient Instructions (Signed)
Medication Instructions:  Your physician recommends that you continue on your current medications as directed. Please refer to the Current Medication list given to you today. *If you need a refill on your cardiac medications before your next appointment, please call your pharmacy*  Lab Work: none If you have labs (blood work) drawn today and your tests are completely normal, you will receive your results only by: Marland Kitchen MyChart Message (if you have MyChart) OR . A paper copy in the mail If you have any lab test that is abnormal or we need to change your treatment, we will call you to review the results.  Testing/Procedures: none  Follow-Up: At [facility], you and your health needs are our priority.  As part of our continuing mission to provide you with exceptional heart care, we have created designated Provider Care Teams.  These Care Teams include your primary Cardiologist (physician) and Advanced Practice Providers (APPs -  Physician Assistants and Nurse Practitioners) who all work together to provide you with the care you need, when you need it.  Your next appointment:   12 month(s)  The format for your next appointment:   Either In Person or Virtual  Provider:   Shelva Majestic, MD

## 2019-01-18 NOTE — Progress Notes (Signed)
Cardiology Office Note    Date:  01/18/2019   ID:  Megan Savage, DOB [birth year], MRN [id number]  PCP:  Shon Baton, MD  Cardiologist:  Shelva Majestic, MD   Follow-up office visit  History of Present Illness:  Megan Savage is a 63 y.o. female who is followed by Dr. Shon Baton for primary care.  I saw her for initial evaluation in August 2019 when she was self-referred for difficult control hypertension and cardiovascular risk assessment.  She presents for a 5 month follow-up evaluation.  .  [name] is known to me.  She remotely had been seen by Dr. Doreatha Lew in 2012 and at that time had mild hypertension, hypohyroidism, obesity, and hyperlipidemia.  She was on valsartan 80 mg, levothyroxine 25 mcg, and omega-3 fatty acid.  She lost a significant amount of weight approximately 4 years ago and it and with a 36 pound weight loss her blood pressure improved and apparently she was taken off medication.  She had been started back on blood pressure medication in February 2019 and due to the valsartan recall was initially started on losartan.  She is now on 100 mg daily.  Bite this, she has noticed that her blood pressures have been consistently elevated.  She has regained some of her weight back.  On July 17, 2017 undergone a CT calcium score was 0.  She was incidentally found to have a right lower lobe noncalcified pulmonary nodule.  She has subsequently been evaluated by Dr. Elenor Quinones at [facility] with chief of thoracic surgery who she has served with on the cancer board at [facility].  She has a family history of heart issues and grandparents and her mother dying at 84 secondary to arrhythmia.  When I initially saw her she denied any chest pain, PND orthopnea.  She denied any awareness of sleep apnea but sleeps by herself and was unaware if she snores.   When I initially saw her, she was significantly hypertensive with blood pressure 170/88 without orthostatic change.  At that time, I recommended discontinuing  losartan and in its place substituted this for olmesartan 40 mg which should be more potent.  I also recommended the addition of Spironolactone 12.5 mg.  She underwent an echo Doppler study which demonstrated mild concentric LVH with normal EF at 60 to 65% without regional wall motion abnormalities.  There was grade 1 diastolic dysfunction and tissue Doppler was consistent with high ventricular filling pressures.  There was mild left atrial dilation.  Because of her somewhat accelerated hypertension I recommended a renal artery duplex evaluation.  This demonstrated mild to less than 59% bilateral narrowing of the right and left renal arteries with abnormal resistive index.  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  She denies any abdominal symptoms.  When I saw her I reviewed data with reference to the clinical atherosclerosis.  We discussed diet and her laboratory in May which had shown a total cholesterol of 206, HDL 34, LDL 118, and triglycerides of 271.   When I saw her in the office In October 2019 her blood pressure had improved on her increased medical regimen of olmesartan 40 mg and spironolactone 12.5 mg.  I recommended initiation of rosuvastatin 10 mg every other day for 2 weeks and if tolerated to try to increase this to 10 mg daily.    Once the COVID-19 pandemic kit, she became more sedentary and essentially stayed home.  Initially she  was drinking more wine than normally.  I saw her for a telemedicine visit on May 24, 2018.  She could not tolerate the statin and had been started on Zetia 10 mg.  She underwent repeat laboratory prior to that telemedicine visit on May 12, 2018 and her total cholesterol increased to 227, triglycerides 914, HDL 27.  Remotely she recalled back in 2015 her triglycerides on 1 occasion had increased to 1305.  During that evaluation her blood pressure was elevated but she had received a hip injection earlier that morning.   Subsequently, she enrolled in weight watchers.  She dramatically changed her diet and significantly reduced her wine consumption, not drinking at all during the week and only minimally on the weekends.   When I saw her on July 29, 2018 with the above changes to her regimen and with dietary adherence to weight watchers she had lost 22 pounds since May 24, 2018.  Her blood pressure has significantly improved and her dose of olmesartan was reduced due to her calling and stating her blood pressure was getting low.  She underwent recent repeat laboratory which was dramatically improved with total cholesterol improving from 2 27-1 18, triglycerides from 914 to 83, HDL increasing from 27 to 42, and her LDL cholesterol improved to 59.  She feels great.    Over the past 5 months, she has continued with her diet.  She has been using weight watchers.  She has been walking at least 3 miles every day.  She no longer drinks wine during the week except on weekends.  She notes more energy.  She denies chest pain PND orthopnea.  She is unaware of palpitations.  She underwent right shoulder surgery replacement on September 17, 2018 and tolerated this well from a cardiovascular standpoint.  She did undergo physical therapy.  Repeat laboratory on December 28, 2018 showed a total cholesterol of 130, HDL 56, LDL 63, and triglycerides were now down to 54.  She feels great.  She is able to use some of her old wardrobe that she has not used in years and she has lost a total of 44 pounds since April.  Her weight at home today was 137 with a peak weight in April at 181.  Of note in 2015 she weighed 198 pounds.  She has had a recent telemedicine visit with Dr. Virgina Jock.  She presents for follow-up evaluation.   Past Medical History:  Diagnosis Date  . Arthritis   . Depression    denies   . Fatigue    resolved   . Hypercholesterolemia   . Hyperlipidemia    controlled   . Hypertension    controlled  . Hypothyroidism   . Insomnia    . Obesity   . Skin disorder    rosacea    Past Surgical History:  Procedure Laterality Date  . APPENDECTOMY  1976  . BREAST IMPLANT REMOVAL Bilateral    implanted 1996 removed 2002  . CARPAL TUNNEL RELEASE     bilateral   . ENDOMETRIAL FULGURATION  1992  . foot surgery      tumor removed (left)   . HYSTEROSCOPY WITH D & C N/A 06/15/2018   Procedure: DILATATION AND CURETTAGE /HYSTEROSCOPY;  Surgeon: Dian Queen, MD;  Location: [facility];  Service: Gynecology;  Laterality: N/A;  . hysterscopy     . OOPHORECTOMY  1976   left ovary removed   . TOTAL SHOULDER ARTHROPLASTY Right 09/17/2018   Procedure: TOTAL SHOULDER ARTHROPLASTY  anatomical;  Surgeon: Netta Cedars, MD;  Location: [facility];  Service: Orthopedics;  Laterality: Right;    Current Medications: Outpatient Medications Prior to Visit  Medication Sig Dispense Refill  . aspirin 81 MG tablet Take 81 mg by mouth daily.      . calcium citrate-vitamin D 200-200 MG-UNIT TABS Take 2 tablets by mouth daily.     . cholecalciferol (VITAMIN D3) 25 MCG (1000 UT) tablet Take 1,000 Units by mouth daily.    Marland Kitchen estradiol (VIVELLE-DOT) 0.025 MG/24HR Place 1 patch onto the skin 2 (two) times a week.      . ezetimibe (ZETIA) 10 MG tablet Take 10 mg by mouth daily.    . fenofibrate (TRICOR) 145 MG tablet TAKE ONE TABLET DAILY 90 tablet 1  . levothyroxine (SYNTHROID) 75 MCG tablet Take 75 mcg by mouth daily before breakfast.    . metroNIDAZOLE (METROCREAM) 0.75 % cream Apply 1 application topically 2 (two) times daily.     . multivitamin (THERAGRAN) per tablet Take 1 tablet by mouth daily.      Marland Kitchen olmesartan (BENICAR) 40 MG tablet TAKE ONE TABLET DAILY (Patient taking differently: Take 20 mg by mouth daily. ) 30 tablet 5  . progesterone (PROMETRIUM) 100 MG capsule Take 100 mg by mouth See admin instructions. Days 1-10 of every month    . spironolactone (ALDACTONE) 25 MG tablet TAKE ONE-HALF TABLET DAILY 15 tablet 5  . TURMERIC  CURCUMIN PO Take 500 mg by mouth 2 (two) times daily.     Marland Kitchen VASCEPA 1 g CAPS TAKE TWO CAPSULES TWICE DAILY 120 capsule 2  . vitamin C (ASCORBIC ACID) 500 MG tablet Take 500 mg by mouth daily.    . Vitamin D, Ergocalciferol, (DRISDOL) 50000 units CAPS capsule Take 50,000 Units by mouth every 30 (thirty) days.      No facility-administered medications prior to visit.     Allergies:   Patient has no known allergies.   Social History   Socioeconomic History  . Marital status: Divorced    Spouse name: Not on file  . Number of children: Not on file  . Years of education: Not on file  . Highest education level: Not on file  Occupational History  . Occupation: Astronomer: SELF-EMPLOYED  Tobacco Use  . Smoking status: Never Smoker  . Smokeless tobacco: Never Used  Substance and Sexual Activity  . Alcohol use: Yes    Comment: 2-3 glasses wine daily , 1 drink each weekend   . Drug use: Never  . Sexual activity: Not on file  Other Topics Concern  . Not on file  Social History Narrative  . Not on file   Social Determinants of Health   Financial Resource Strain:   . Difficulty of Paying Living Expenses: Not on file  Food Insecurity:   . Worried About Charity fundraiser in the Last Year: Not on file  . Ran Out of Food in the Last Year: Not on file  Transportation Needs:   . Lack of Transportation (Medical): Not on file  . Lack of Transportation (Non-Medical): Not on file  Physical Activity:   . Days of Exercise per Week: Not on file  . Minutes of Exercise per Session: Not on file  Stress:   . Feeling of Stress : Not on file  Social Connections:   . Frequency of Communication with Friends and Family: Not on file  . Frequency of Social Gatherings with Friends and Family: Not  on file  . Attends Religious Services: Not on file  . Active Member of Clubs or Organizations: Not on file  . Attends Archivist Meetings: Not on file  . Marital Status: Not  on file    Additional social history is that she has been very active in the community.  She serves on the cancer board at [facility].  She had worked in [employer] for 12 years from [phone].  She has worked in Pharmacologist.  She was involved with politics had worked for the [employer].  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  Family History:  The patient's family history includes Arrhythmia in her mother; Asthma in her brother, mother, and paternal aunt; Cancer in her brother and father; Emphysema in her mother.   Her father died at age 64 and was a physician.  He had laryngeal CA.  Mother died at 22 secondary to arrhythmias.  Her brother died at age 78 secondary to ALL and T-cell lymphoma.  ROS General: Negative; No fevers, chills, or night sweats;  HEENT: Negative; No changes in vision or hearing, sinus congestion, difficulty swallowing Pulmonary: Negative; No cough, wheezing, shortness of breath, hemoptysis Cardiovascular: Negative; No chest pain, presyncope, syncope, palpitations GI: Negative; No nausea, vomiting, diarrhea, or abdominal pain GU: Negative; No dysuria, hematuria, or difficulty voiding Musculoskeletal: Status post right shoulders replacement September 17, 2018. Hematologic/Oncology: Negative; no easy bruising, bleeding Endocrine: Negative; no heat/cold intolerance; no diabetes Neuro: Negative; no changes in balance, headaches Skin: Negative; No rashes or skin lesions Psychiatric: Negative; No behavioral problems, depression Sleep: Negative; No snoring, daytime sleepiness, hypersomnolence, bruxism, restless legs, hypnogognic hallucinations, no cataplexy Other comprehensive 14 point system review is negative.   PHYSICAL EXAM:   VS:  BP 125/70   Pulse 78   Temp 97.9 F (36.6 C)   Ht 5' 4.5" (1.638 m)   Wt 144 lb 6.4 oz (65.5 kg)   SpO2 96%   BMI 24.40 kg/m     Repeat blood  pressure by me was 124/70  Wt Readings from Last 3 Encounters:  01/18/19 144 lb 6.4 oz (65.5 kg)  09/17/18 145 lb 15.1 oz (66.2 kg)  09/15/18 146 lb (66.2 kg)    General: Alert, oriented, no distress.  Skin: normal turgor, no rashes, warm and dry HEENT: Normocephalic, atraumatic. Pupils equal round and reactive to light; sclera anicteric; extraocular muscles intact;  Nose without nasal septal hypertrophy Mouth/Parynx benign; Mallinpatti scale previously noted to be 3 Neck: No JVD, no carotid bruits; normal carotid upstroke Lungs: clear to ausculatation and percussion; no wheezing or rales Chest wall: without tenderness to palpitation Heart: PMI not displaced, RRR, s1 s2 normal, 1/6 systolic murmur, no diastolic murmur, no rubs, gallops, thrills, or heaves Abdomen: soft, nontender; no hepatosplenomehaly, BS+; abdominal aorta nontender and not dilated by palpation. Back: no CVA tenderness Pulses 2+ Musculoskeletal: full range of motion, normal strength, no joint deformities Extremities: no clubbing cyanosis or edema, Homan's sign negative  Neurologic: grossly nonfocal; Cranial nerves grossly wnl Psychologic: Normal mood and affect   Studies/Labs Reviewed:   ECG (independently read by me): Normal sinus rhythm at 78 bpm, left axis deviation.  Poor anterior R wave progression.  Normal intervals.  No ectopy.  July 2020 EKG:  EKG is  ordered today.  ECG (independently read by me): Normal sinus rhythm at 74 bpm.  Borderline first-degree AV block with minimal to 202 ms.  QTc interval normal at 437 ms.  October 2019 ECG (independently read by me): Sinus rhythm 84 bpm.  Borderline first-degree AV block with a PR interval 206 ms.  Mild left axis deviation.  No ectopy.  August 31, 2017 ECG (independently read by me): Sinus rhythm at 76 bpm.  Right superior axis.  Normal intervals.  No ectopy.  No ST segment changes.  Recent Labs: BMP Latest Ref Rng & Units 09/18/2018 09/15/2018 07/22/2018   Glucose 70 - 99 mg/dL 147(H) 102(H) 111(H)  BUN 8 - 23 mg/dL '18 14 15  '$ Creatinine 0.44 - 1.00 mg/dL 0.56 0.57 0.67  BUN/Creat Ratio 12 - 28 - - 22  Sodium 135 - 145 mmol/L 140 138 135  Potassium 3.5 - 5.1 mmol/L 4.0 4.2 4.3  Chloride 98 - 111 mmol/L 108 108 101  CO2 22 - 32 mmol/L '22 23 22  '$ Calcium 8.9 - 10.3 mg/dL 8.9 9.6 9.6     Hepatic Function Latest Ref Rng & Units 07/22/2018 05/12/2018 06/21/2010  Total Protein 6.0 - 8.5 g/dL 6.4 7.0 6.5  Albumin 3.8 - 4.8 g/dL 4.7 4.6 4.0  AST 0 - 40 IU/L '17 22 27  '$ ALT 0 - 32 IU/L 19 31 35  Alk Phosphatase 39 - 117 IU/L 38(L) 84 75  Total Bilirubin 0.0 - 1.2 mg/dL 0.5 0.3 0.5  Bilirubin, Direct 0.0 - 0.3 mg/dL - - 0.1    CBC Latest Ref Rng & Units 09/18/2018 09/15/2018 06/11/2018  WBC 4.0 - 10.5 K/uL - 4.0 5.3  Hemoglobin 12.0 - 15.0 g/dL 10.3(L) 11.7(L) 12.8  Hematocrit 36.0 - 46.0 % 32.1(L) 35.9(L) 37.5  Platelets 150 - 400 K/uL - 230 279   Lab Results  Component Value Date   MCV 93.7 09/15/2018   MCV 93.5 06/11/2018   MCV 90.5 06/21/2010   Lab Results  Component Value Date   TSH 2.60 06/21/2010   No results found for: HGBA1C   BNP No results found for: BNP  ProBNP No results found for: PROBNP   Lipid Panel     Component Value Date/Time   CHOL 118 07/22/2018 0900   TRIG 83 07/22/2018 0900   HDL 42 07/22/2018 0900   CHOLHDL 2.8 07/22/2018 0900   CHOLHDL 4 06/21/2010 0811   VLDL 36.4 06/21/2010 0811   LDLCALC 59 07/22/2018 0900     RADIOLOGY: No results found.   Additional studies/ records that were reviewed today include:  I reviewed her remote record from Dr. Doreatha Lew in 2012.  I also reviewed her initial consultation at [facility] on July 28, 2017 by Dr. Elenor Quinones.  Reviewed her CT calcium score; echo Doppler study, renal duplex scan.  Recent laboratory from December 28, 2018 was reviewed.  Total cholesterol 130, HDL 56, LDL 63, triglycerides 54.  Hemoglobin A1c on July 27, 2018 was 5.1.  Serum creatinine 0.6 on  December 28, 2018.  TSH 1.72.  ASSESSMENT:    1. Essential hypertension   2. Mixed hyperlipidemia   3. Acquired hypothyroidism   4. Pulmonary nodule, right    PLAN:   1.  Essential hypertension: She has a history of hypertension dating back to at least 2012 at which time medical therapy was initiated.  She had stopped medication for several years after she had lost significant weight.  I had reinstituted medication and in April 2020  telemedicine visit blood pressure was still increased on olmesartan 40 mg and spironolactone 12.5 mg daily.  Since that evaluation with her significant weight loss,  reduction in alcohol intake as well as increased activity her blood pressure is now excellent.   Her blood pressure today continues to be stable at 124/70 on repeat by me.  She is now on a reduced dose of olmesartan at 20 mg daily and continues to take spironolactone 12.5 mg.  With her optimal blood pressure I have recommended continuation of current therapy.  2.  Mixed hyperlipidemia: Historically she had an episode of marked hypertriglyceridemia in 2015 with levels at 1305.  Recently, with initiation of the COVID pandemic and stay at home dietary compliance was significantly less and there was consumption of increased wine compared to previously.  This resulted in significant increase in triglycerides to 914.  Since May 24, 2018 she has lost 44 pounds and is continued with her diet of typically having fish 4 times per week, absence of wine during the week, and has been exercising regularly and avoidance of sugars and sweets as well as doing weight watchers.  Lipid studies are now excellent with triglyceride at 54, LDL 63, HDL increasing to 56 and LDL at 130.  She will continue with current therapy of fenofibrate 145 mg, Vascepa 2 capsules twice a day in addition to Zetia 10 mg daily.   3.  Overweight: She has lost 4 pounds since initiating weight watchers in late April 2020.  BMI today is ideal at 24.4.   She feels great.  She is exercising at least 3 miles walking every day.  She continues to participate with weight watchers program.  4. Hypothyroidism: Most recent TSH stable at 1.72 on her current dose of levothyroxine 75 mcg daily  5.  Pulmonary nodule: Followed by Dr. Elenor Quinones at [facility].  Recently told that this is stable and does not need further evaluation.  6.  Status post right shoulder replacement September 17, 2018; completed physical therapy.    Medication Adjustments/Labs and Tests Ordered: Current medicines are reviewed at length with the patient today.  Concerns regarding medicines are outlined above.  Medication changes, Labs and Tests ordered today are listed in the Patient Instructions below. Patient Instructions  Medication Instructions:  Your physician recommends that you continue on your current medications as directed. Please refer to the Current Medication list given to you today. *If you need a refill on your cardiac medications before your next appointment, please call your pharmacy*  Lab Work: none If you have labs (blood work) drawn today and your tests are completely normal, you will receive your results only by: Marland Kitchen MyChart Message (if you have MyChart) OR . A paper copy in the mail If you have any lab test that is abnormal or we need to change your treatment, we will call you to review the results.  Testing/Procedures: none  Follow-Up: At [facility], you and your health needs are our priority.  As part of our continuing mission to provide you with exceptional heart care, we have created designated Provider Care Teams.  These Care Teams include your primary Cardiologist (physician) and Advanced Practice Providers (APPs -  Physician Assistants and Nurse Practitioners) who all work together to provide you with the care you need, when you need it.  Your next appointment:   12 month(s)  The format for your next appointment:   Either In Person or  Virtual  Provider:   Shelva Majestic, MD      Signed, Shelva Majestic, MD  01/18/2019 6:27 PM    [facility] [address], [address], [place], [place]  [address] Phone: [phone]

## 2019-01-19 ENCOUNTER — Telehealth: Payer: Self-pay

## 2019-01-19 NOTE — Telephone Encounter (Signed)
Attempted to call patient to discuss her mychart message, no answer. Left message to call back.

## 2019-01-19 NOTE — Telephone Encounter (Signed)
Spoke with patient. Patient informed ECG interprets on it's own and that the MD then reads and interprets the EKG. Dr. Claiborne Billings would have addressed any issues identified on the EKG with patient and she should not be alarmed. Patient verbalized understanding.

## 2019-01-24 ENCOUNTER — Other Ambulatory Visit: Payer: Self-pay | Admitting: Internal Medicine

## 2019-01-24 ENCOUNTER — Other Ambulatory Visit: Payer: Self-pay | Admitting: Cardiovascular Disease

## 2019-01-24 DIAGNOSIS — Z1231 Encounter for screening mammogram for malignant neoplasm of breast: Secondary | ICD-10-CM

## 2019-01-26 ENCOUNTER — Ambulatory Visit: Payer: BC Managed Care – PPO | Attending: Internal Medicine

## 2019-01-26 DIAGNOSIS — Z20822 Contact with and (suspected) exposure to covid-19: Secondary | ICD-10-CM

## 2019-01-27 LAB — NOVEL CORONAVIRUS, NAA: SARS-CoV-2, NAA: NOT DETECTED

## 2019-02-17 ENCOUNTER — Ambulatory Visit: Payer: BC Managed Care – PPO | Attending: Internal Medicine

## 2019-02-17 DIAGNOSIS — Z23 Encounter for immunization: Secondary | ICD-10-CM | POA: Insufficient documentation

## 2019-02-17 NOTE — Progress Notes (Signed)
   Covid-19 Vaccination Clinic  Name:  Megan Savage    MRN: [id number] DOB: 1955/02/25  02/17/2019  [name] was observed post Covid-19 immunization for 15 minutes without incidence. She was provided with Vaccine Information Sheet and instruction to access the V-Safe system.   [name] was instructed to call 911 with any severe reactions post vaccine: Marland Kitchen Difficulty breathing  . Swelling of your face and throat  . A fast heartbeat  . A bad rash all over your body  . Dizziness and weakness    Immunizations Administered    Name Date Dose VIS Date Route   Pfizer COVID-19 Vaccine 02/17/2019  9:55 AM 0.3 mL 01/07/2019 Intramuscular   Manufacturer: [place]   Lot: [id number]   [place]: [id number]

## 2019-02-22 ENCOUNTER — Other Ambulatory Visit: Payer: Self-pay | Admitting: Cardiovascular Disease

## 2019-03-07 ENCOUNTER — Other Ambulatory Visit: Payer: Self-pay

## 2019-03-07 ENCOUNTER — Ambulatory Visit
Admission: RE | Admit: 2019-03-07 | Discharge: 2019-03-07 | Disposition: A | Discharge status: Home / Self Care | Payer: BC Managed Care – PPO | Source: Ambulatory Visit | Attending: Internal Medicine | Admitting: Internal Medicine

## 2019-03-07 DIAGNOSIS — Z1231 Encounter for screening mammogram for malignant neoplasm of breast: Secondary | ICD-10-CM

## 2019-03-09 ENCOUNTER — Ambulatory Visit: Payer: BC Managed Care – PPO | Attending: Internal Medicine

## 2019-03-09 DIAGNOSIS — Z23 Encounter for immunization: Secondary | ICD-10-CM

## 2019-03-09 NOTE — Progress Notes (Signed)
   Covid-19 Vaccination Clinic  Name:  Megan Savage    MRN: [id number] DOB: 12/24/1955  03/09/2019  Megan Savage was observed post Covid-19 immunization for 15 minutes without incidence. She was provided with Vaccine Information Sheet and instruction to access the V-Safe system.   Megan Savage was instructed to call 911 with any severe reactions post vaccine: Marland Kitchen Difficulty breathing  . Swelling of your face and throat  . A fast heartbeat  . A bad rash all over your body  . Dizziness and weakness    Immunizations Administered    Name Date Dose VIS Date Route   Pfizer COVID-19 Vaccine 03/09/2019  8:11 AM 0.3 mL 01/07/2019 Intramuscular   Manufacturer: [place]   Lot: [id number]   [place]: [id number]

## 2019-04-04 ENCOUNTER — Encounter: Payer: Self-pay | Admitting: Podiatry

## 2019-04-04 ENCOUNTER — Ambulatory Visit: Payer: BC Managed Care – PPO | Admitting: Podiatry

## 2019-04-04 ENCOUNTER — Other Ambulatory Visit: Payer: Self-pay

## 2019-04-04 DIAGNOSIS — L600 Ingrowing nail: Secondary | ICD-10-CM | POA: Diagnosis not present

## 2019-04-04 MED ORDER — NEOMYCIN-POLYMYXIN-HC 3.5-10000-1 OT SOLN: OTIC | 1 refills | Status: DC

## 2019-04-04 NOTE — Patient Instructions (Signed)

## 2019-04-07 NOTE — Progress Notes (Signed)
Subjective:   Patient ID: Megan Savage, female   DOB: 64 y.o.   MRN: [id number]   HPI Patient presents stating she is had a lot of pain in her left big toenail and states it is been the border and sore and making it hard for her to wear shoe gear comfortably.  Patient presents for toenail border correction   ROS      Objective:  Physical Exam  Neurovascular status intact with patient found to have incurvated left hallux lateral border that is painful when pressed with no drainage or redness present     Assessment:  Ingrown toenail deformity left hallux lateral border     Plan:  H&P reviewed condition recommended correction of deformity explaining procedure risk and patient signed consent form after reviewing feet.  At this point I went ahead and I infiltrated the left hallux 60 mg like Marcaine mixture sterile prep applied to the toe and using sterile instrumentation I remove the border exposed matrix I applied phenol 3 applications 30 seconds followed by alcohol lavage and sterile dressing.  Gave instructions on soaks and patient will be seen back for [name] to recheck again in the next several weeks or earlier if any issues were to occur

## 2019-04-25 ENCOUNTER — Other Ambulatory Visit: Payer: Self-pay | Admitting: Cardiovascular Disease

## 2019-05-19 ENCOUNTER — Other Ambulatory Visit: Payer: Self-pay | Admitting: Cardiovascular Disease

## 2019-05-23 ENCOUNTER — Other Ambulatory Visit: Payer: Self-pay | Admitting: Cardiovascular Disease

## 2019-05-25 ENCOUNTER — Telehealth: Payer: Self-pay

## 2019-05-25 NOTE — Telephone Encounter (Signed)
Prior authorization for Dalene Seltzer has been approved.  For patients covered by Medicare Part D, you may submit a tier exception form to the plan or PBM. If approved by the plan or PBM, it may reduce the patient's out-of-pocket costs. Type "tier exception" in the "plan or PBM" field on the Form Pick page to view eligible tier exception forms.  Message from plan: Effective from 05/24/2019 through 05/22/2020.

## 2019-05-27 ENCOUNTER — Other Ambulatory Visit: Payer: Self-pay

## 2019-06-20 ENCOUNTER — Encounter: Payer: Self-pay | Admitting: Cardiovascular Disease

## 2019-07-08 ENCOUNTER — Encounter: Payer: Self-pay | Admitting: Cardiovascular Disease

## 2019-08-24 ENCOUNTER — Other Ambulatory Visit: Payer: Self-pay

## 2019-08-24 MED ORDER — FENOFIBRIC ACID 135 MG PO CPDR: 135.0000 mg | DELAYED_RELEASE_CAPSULE | Freq: Every day | ORAL | 3 refills | Status: DC

## 2019-10-15 ENCOUNTER — Other Ambulatory Visit: Payer: Self-pay | Admitting: Cardiovascular Disease

## 2019-12-13 ENCOUNTER — Other Ambulatory Visit: Payer: Self-pay | Admitting: Cardiovascular Disease

## 2019-12-26 ENCOUNTER — Other Ambulatory Visit: Payer: Self-pay | Admitting: Cardiovascular Disease

## 2020-01-23 ENCOUNTER — Other Ambulatory Visit: Payer: Self-pay | Admitting: Internal Medicine

## 2020-01-23 DIAGNOSIS — Z1231 Encounter for screening mammogram for malignant neoplasm of breast: Secondary | ICD-10-CM

## 2020-01-25 ENCOUNTER — Other Ambulatory Visit: Payer: Self-pay | Admitting: Cardiovascular Disease

## 2020-03-01 IMAGING — MG DIGITAL SCREENING BILAT W/ TOMO W/ CAD
8 series · 9 of 24 positions shown · non-contrast
Comparison: Previous exam(s).

CLINICAL DATA: Screening.

EXAM:
DIGITAL SCREENING BILATERAL MAMMOGRAM WITH TOMO AND CAD

[R MLO synth-2D]
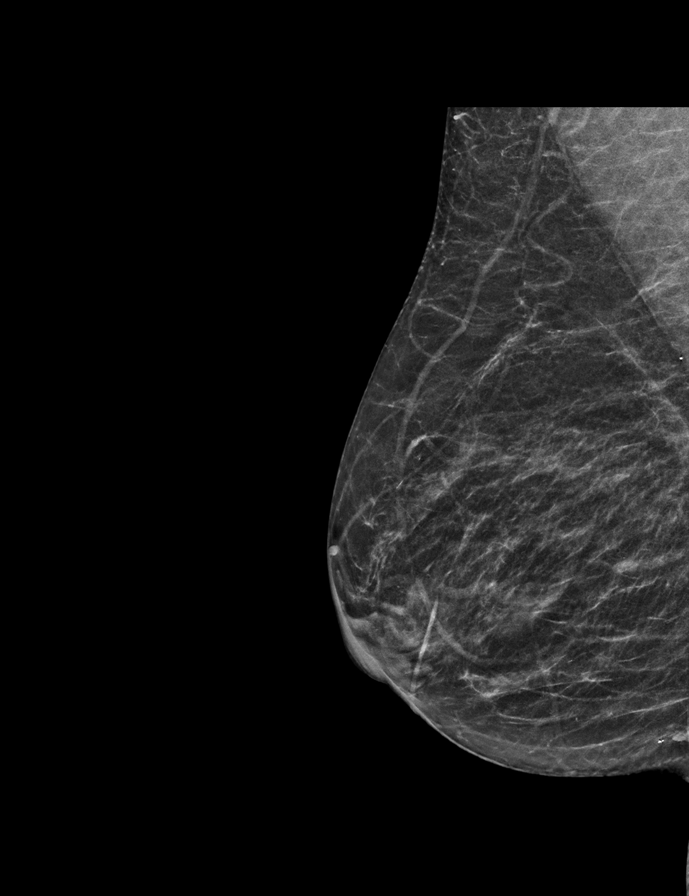

[L CC synth-2D]
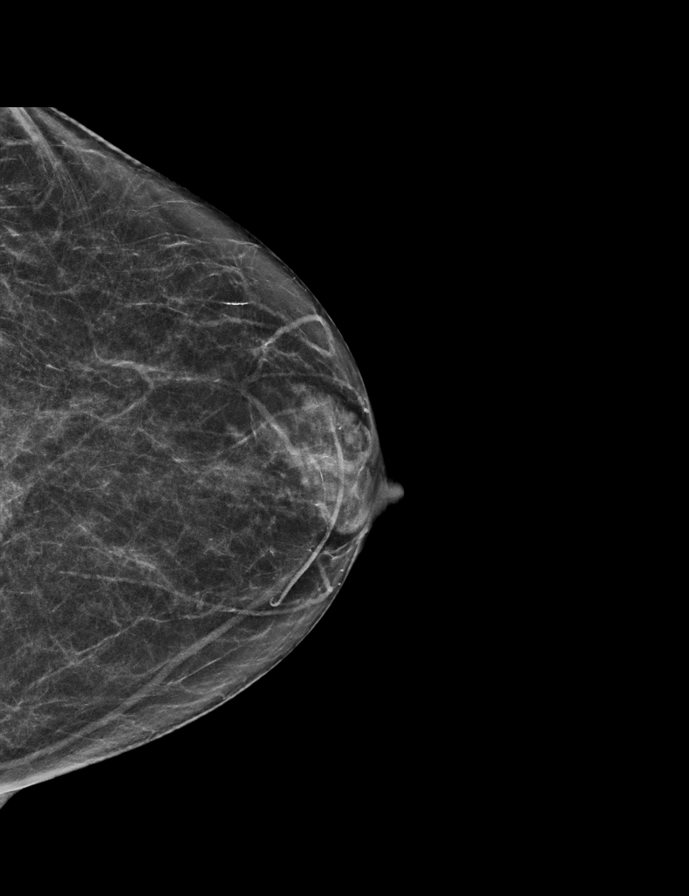

[R CC synth-2D]
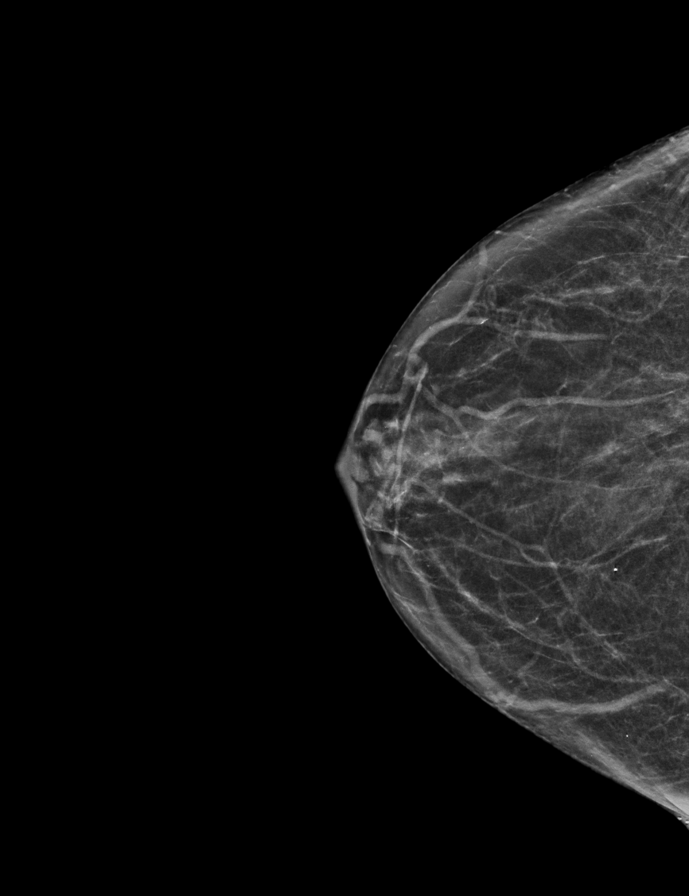

[L MLO synth-2D]
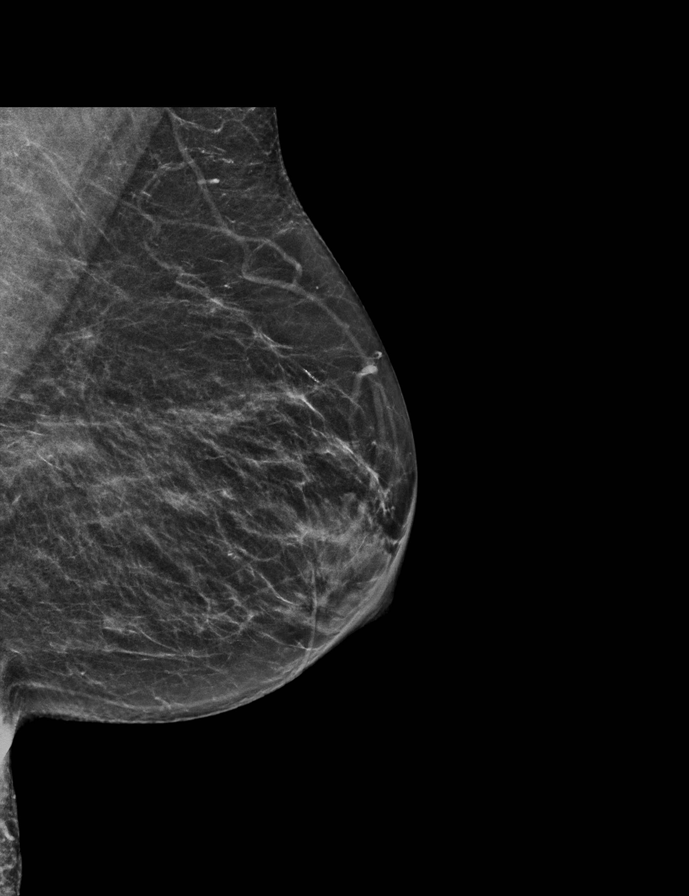

[R MLO tomo · 2 of 50 frames shown]
[frame 17/50]
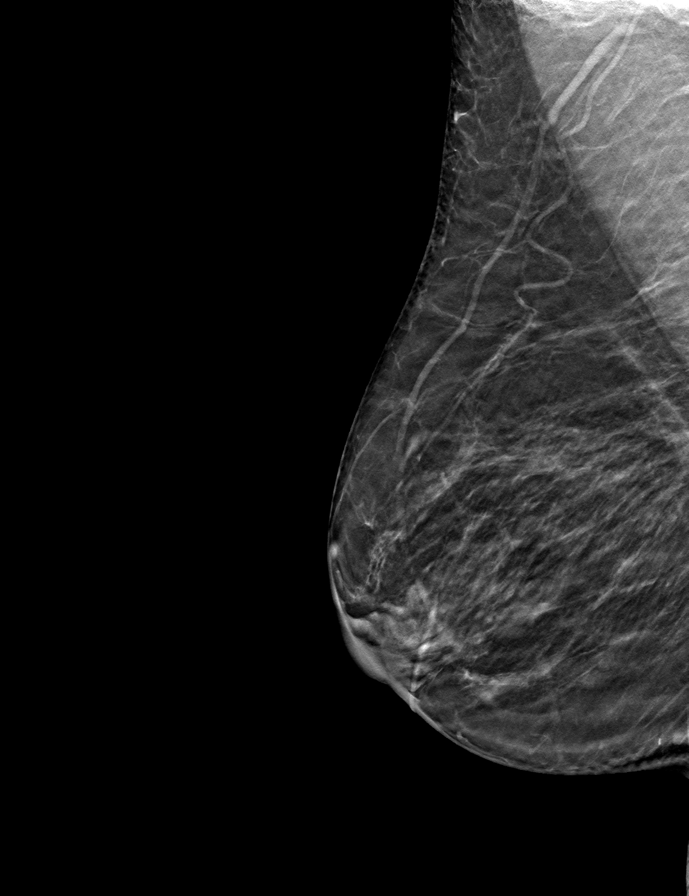
[frame 25/50]
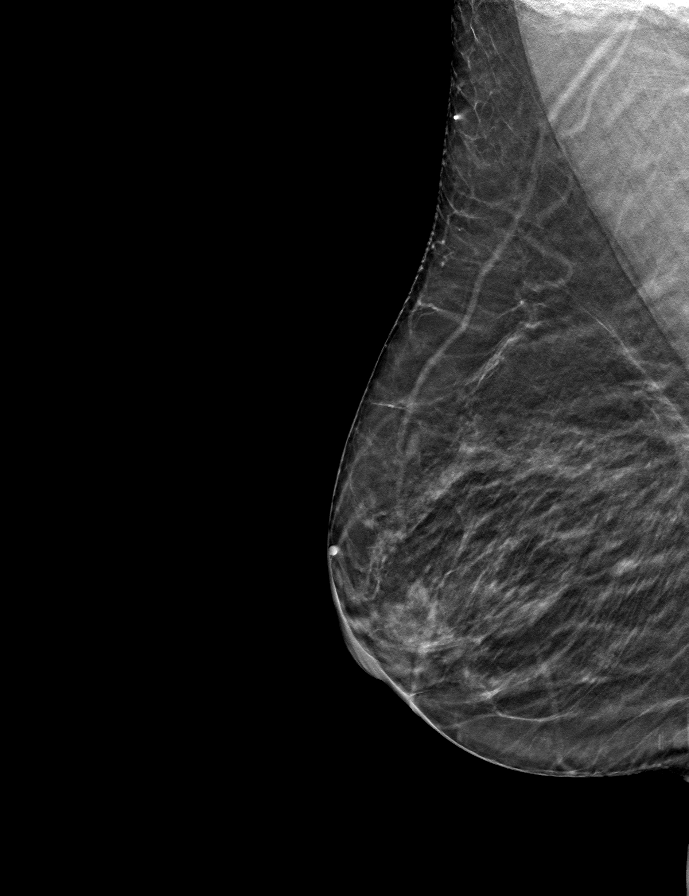

[L CC tomo · tomo slice 25/50.0]
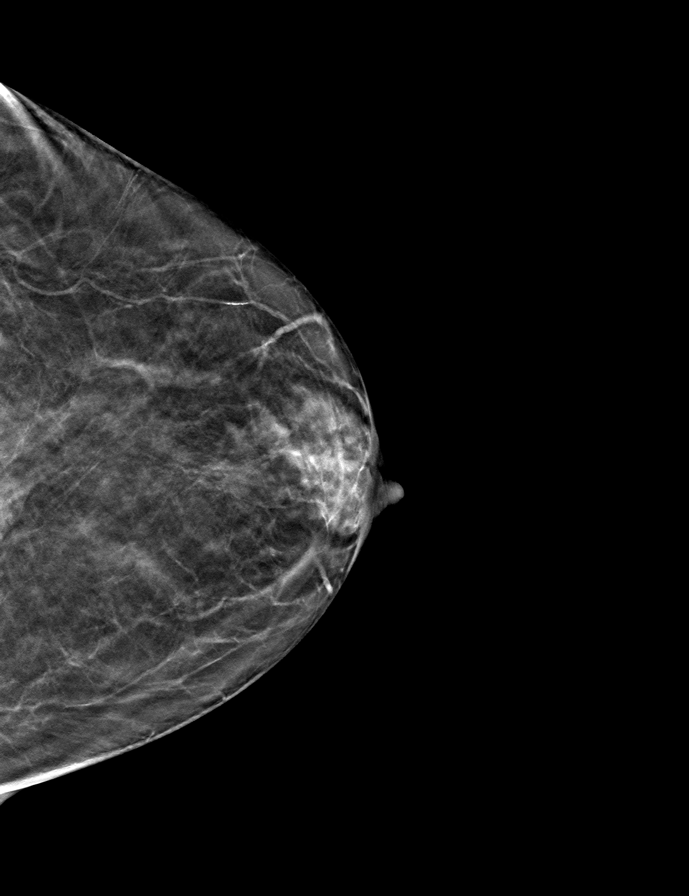

[L MLO tomo · tomo slice 26/51.0]
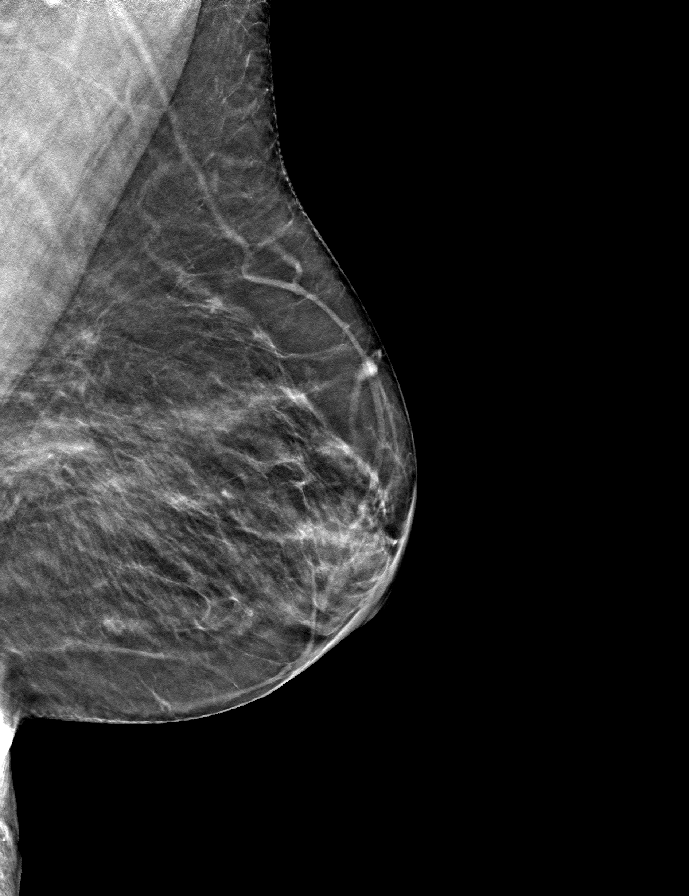

[R CC tomo · tomo slice 25/48.0]
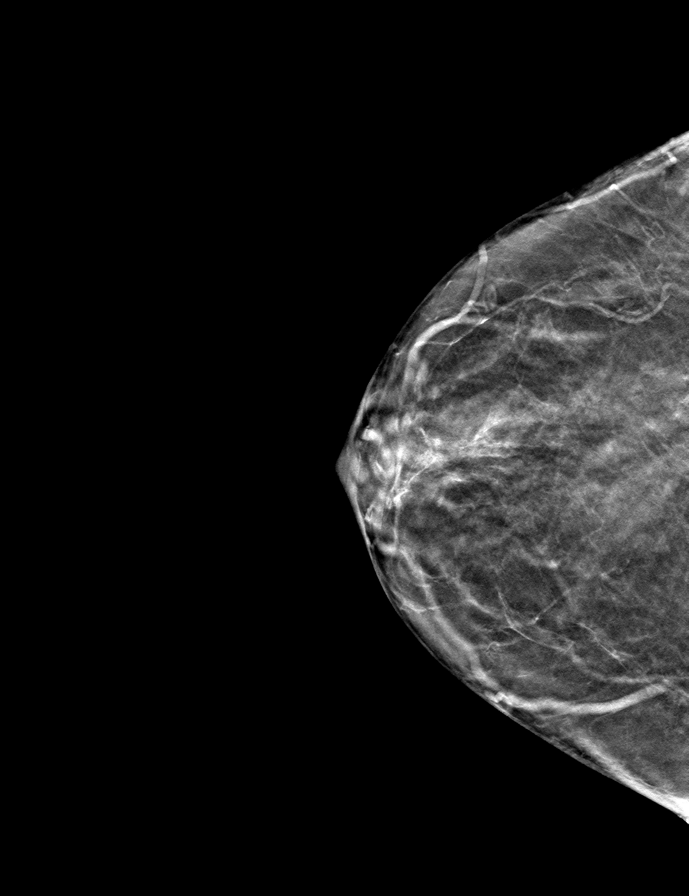

[9 of 24 positions shown; findings below may reference images not displayed]

ACR Breast Density Category b: There are scattered areas of
fibroglandular density.
FINDINGS: There are no findings suspicious for malignancy. Images were
processed with CAD.
IMPRESSION: No mammographic evidence of malignancy. A result letter of this
screening mammogram will be mailed directly to the patient.

RECOMMENDATION:
Screening mammogram in one year. (Code:[id number])

BI-RADS CATEGORY  1: Negative.

## 2020-03-08 ENCOUNTER — Ambulatory Visit
Admission: RE | Admit: 2020-03-08 | Discharge: 2020-03-08 | Disposition: A | Discharge status: Home / Self Care | Payer: Medicare Other | Source: Ambulatory Visit | Attending: Internal Medicine | Admitting: Internal Medicine

## 2020-03-08 ENCOUNTER — Other Ambulatory Visit: Payer: Self-pay

## 2020-03-08 DIAGNOSIS — Z1231 Encounter for screening mammogram for malignant neoplasm of breast: Secondary | ICD-10-CM

## 2020-03-13 ENCOUNTER — Other Ambulatory Visit: Payer: Self-pay | Admitting: Internal Medicine

## 2020-03-13 DIAGNOSIS — R928 Other abnormal and inconclusive findings on diagnostic imaging of breast: Secondary | ICD-10-CM

## 2020-03-26 ENCOUNTER — Ambulatory Visit
Admission: RE | Admit: 2020-03-26 | Discharge: 2020-03-26 | Disposition: A | Discharge status: Home / Self Care | Payer: Medicare Other | Source: Ambulatory Visit | Attending: Internal Medicine | Admitting: Internal Medicine

## 2020-03-26 ENCOUNTER — Other Ambulatory Visit: Payer: Self-pay

## 2020-03-26 DIAGNOSIS — R928 Other abnormal and inconclusive findings on diagnostic imaging of breast: Secondary | ICD-10-CM

## 2020-04-02 ENCOUNTER — Other Ambulatory Visit: Payer: Self-pay | Admitting: Cardiovascular Disease

## 2020-04-24 ENCOUNTER — Other Ambulatory Visit: Payer: Self-pay | Admitting: Cardiovascular Disease

## 2020-05-03 ENCOUNTER — Telehealth: Payer: Self-pay | Admitting: Critical Care Medicine

## 2020-05-03 ENCOUNTER — Encounter: Payer: Self-pay | Admitting: Critical Care Medicine

## 2020-05-03 DIAGNOSIS — R053 Chronic cough: Secondary | ICD-10-CM

## 2020-05-03 DIAGNOSIS — J301 Allergic rhinitis due to pollen: Secondary | ICD-10-CM | POA: Insufficient documentation

## 2020-05-03 MED ORDER — AZELASTINE HCL 0.1 % NA SOLN: 2.0000 | Freq: Two times a day (BID) | NASAL | 1 refills | Status: DC

## 2020-05-03 MED ORDER — BENZONATATE 100 MG PO CAPS: 100.0000 mg | ORAL_CAPSULE | Freq: Three times a day (TID) | ORAL | 4 refills | Status: DC

## 2020-05-03 MED ORDER — PANTOPRAZOLE SODIUM 40 MG PO TBEC: 40.0000 mg | DELAYED_RELEASE_TABLET | Freq: Every day | ORAL | 0 refills | Status: DC

## 2020-05-03 MED ORDER — PREDNISONE 10 MG PO TABS: ORAL_TABLET | ORAL | 0 refills | Status: DC

## 2020-05-03 NOTE — Telephone Encounter (Signed)
This is a 65 year old female had seen previously when I was at practice of [facility]  She called me asking a reference for a new pulmonary physician.  She was at a wedding in March and developed an acute onset 3 weeks ago of an upper respiratory tract symptoms.  She was tested 3 times for Covid and was negative.  The patient is now had a persisting cough over this time.  Note she is periodically received a springtime tracheobronchitis in the past its been recurrent.  No allergy work-up ever is been done.  Currently now she has throat clearing nocturnal cough is minimally productive some wheezing and some chest tightness no real shortness of breath.  She has postnasal drainage anterior rhinorrhea no ear complaints no sinus pressure.  The patient denies any belching burping or frank reflux symptoms.  She has been using Hall's cough drops on a chronic basis for several weeks.  She does have benzonatate which she is tried but it is expired from a 2017 prescription.  She is not on an ACE inhibitor she is not on proton pump inhibitors  Patient was given Flonase and Claritin by her PCP with minimal response.  She is not tried any other over-the-counter cough suppressants.  She has not had a recent chest x-ray.  Her medication list is reviewed and there does not appear to be any medications that could be precipitating her cough  My plan is to obtain a chest x-ray, give her a short course of pulsed prednisone, begin Astelin nasal spray for allergic features, increase Flonase to twice a day, continue Claritin, recommend Delsym over-the-counter for cough suppression, give her a new prescription of a lower dose of benzonatate 100 mg 3 times daily.  She is to get jolly rancher sugar-free and dissolve in maltreated herself to swallow and not clear her throat or cough  She is in a practice voice rest over the long weekend in which she was enthusiastic about because she would like to stay home and watch  the Masters  I am going to refer her to [place] a new pulmonary doctor in the pulmonary practice who I think would be a terrific choice for Ms. Mierzwa  I will call her with her chest x-ray results and she knows to call me if there are problems before she sees Dr. Valeta Harms  Sending copy of this note to Dr. Jamey Ripa and also to Dr. Virgina Jock her primary care

## 2020-05-04 ENCOUNTER — Ambulatory Visit
Admission: RE | Admit: 2020-05-04 | Discharge: 2020-05-04 | Disposition: A | Discharge status: Home / Self Care | Payer: Medicare Other | Source: Ambulatory Visit | Attending: Critical Care Medicine | Admitting: Critical Care Medicine

## 2020-05-04 DIAGNOSIS — R053 Chronic cough: Secondary | ICD-10-CM

## 2020-05-04 NOTE — Telephone Encounter (Signed)
Would be my pleasure. Thank you for the referral.  I will get her worked in soon.   Leroy Sea

## 2020-05-04 NOTE — Telephone Encounter (Signed)
FYI  Pt is scheduled for consult with BI on 05/04 at 2 and pt is aware.

## 2020-05-04 NOTE — Telephone Encounter (Signed)
Greggory Brandy,  please schedule with me ASAP. Ok to work into my next available clinic day any slot.   Referral from Dr. Lyda Jester  Dx: cough   Garner Nash, DO [facility] 05/04/2020 3:57 PM

## 2020-05-08 ENCOUNTER — Other Ambulatory Visit: Payer: Self-pay

## 2020-05-08 ENCOUNTER — Ambulatory Visit (INDEPENDENT_AMBULATORY_CARE_PROVIDER_SITE_OTHER): Payer: Medicare Other | Admitting: Cardiovascular Disease

## 2020-05-08 ENCOUNTER — Encounter: Payer: Self-pay | Admitting: Cardiovascular Disease

## 2020-05-08 VITALS — BP 162/78 | HR 73 | Ht 64.5 in | Wt 171.6 lb

## 2020-05-08 DIAGNOSIS — I10 Essential (primary) hypertension: Secondary | ICD-10-CM | POA: Diagnosis not present

## 2020-05-08 DIAGNOSIS — E039 Hypothyroidism, unspecified: Secondary | ICD-10-CM | POA: Diagnosis not present

## 2020-05-08 DIAGNOSIS — E782 Mixed hyperlipidemia: Secondary | ICD-10-CM | POA: Diagnosis not present

## 2020-05-08 DIAGNOSIS — R053 Chronic cough: Secondary | ICD-10-CM

## 2020-05-08 DIAGNOSIS — E663 Overweight: Secondary | ICD-10-CM

## 2020-05-08 DIAGNOSIS — H3322 Serous retinal detachment, left eye: Secondary | ICD-10-CM

## 2020-05-08 MED ORDER — OLMESARTAN MEDOXOMIL 20 MG PO TABS: 20.0000 mg | ORAL_TABLET | Freq: Every day | ORAL | 3 refills | Status: DC

## 2020-05-08 MED ORDER — SPIRONOLACTONE 25 MG PO TABS: 0.5000 | ORAL_TABLET | Freq: Every day | ORAL | 3 refills | Status: DC

## 2020-05-08 NOTE — Progress Notes (Signed)
Cardiology Office Note    Date:  05/14/2020   ID:  Megan Savage, DOB 10/26/1955, MRN [id number]  PCP:  Megan Baton, MD  Cardiologist:  Megan Majestic, MD   Follow-up office visit  History of Present Illness:  Megan Savage is a 65 y.o. female who is followed by Dr. Shon Savage for primary care.  I saw her for initial evaluation in August 2019 when she was self-referred for difficult control hypertension and cardiovascular risk assessment.  She presents for a 16 month follow-up evaluation.  .  [name] is well known to me.  She remotely had been seen by Dr. Doreatha Savage in 2012 and at that time had mild hypertension, hypohyroidism, obesity, and hyperlipidemia.  She was on valsartan 80 mg, levothyroxine 25 mcg, and omega-3 fatty acid.  She lost a significant amount of weight approximately 4 years ago and it and with a 36 pound weight loss her blood pressure improved and apparently she was taken off medication.  She had been started back on blood pressure medication in February 2019 and due to the valsartan recall was initially started on losartan.  She is now on 100 mg daily.  Bite this, she has noticed that her blood pressures have been consistently elevated.  She has regained some of her weight back.  On July 17, 2017 undergone a CT calcium score was 0.  She was incidentally found to have a right lower lobe noncalcified pulmonary nodule.  She has subsequently been evaluated by Dr. Elenor Savage at [facility] with chief of thoracic surgery who she has served with on the cancer board at [facility]  She has a family history of heart issues and grandparents and her mother dying at 43 secondary to arrhythmia.  When I initially saw her she denied any chest pain, PND orthopnea.  She denied any awareness of sleep apnea but sleeps by herself and was unaware if she snores.   When I initially saw her, she was significantly hypertensive with blood pressure 170/88 without orthostatic change.  At that time, I recommended  discontinuing losartan and in its place substituted this for olmesartan 40 mg which should be more potent.  I also recommended the addition of Spironolactone 12.5 mg.  She underwent an echo Doppler study which demonstrated mild concentric LVH with normal EF at 60 to 65% without regional wall motion abnormalities.  There was grade 1 diastolic dysfunction and tissue Doppler was consistent with high ventricular filling pressures.  There was mild left atrial dilation.  Because of her somewhat accelerated hypertension I recommended a renal artery duplex evaluation.  This demonstrated mild to less than 59% bilateral narrowing of the right and left renal arteries with abnormal resistive index.  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  She denies any abdominal symptoms.  When I saw her I reviewed data with reference to the clinical atherosclerosis.  We discussed diet and her laboratory in May which had shown a total cholesterol of 206, HDL 34, LDL 118, and triglycerides of 271.   When I saw her in the office In October 2019 her blood pressure had improved on her increased medical regimen of olmesartan 40 mg and spironolactone 12.5 mg.  I recommended initiation of rosuvastatin 10 mg every other day for 2 weeks and if tolerated to try to increase this to 10 mg daily.    With the COVID-19 pandemic , she became more sedentary and essentially stayed home.  Initially  she was drinking more wine than normally.  I saw her for a telemedicine visit on May 24, 2018.  She could not tolerate the statin and had been started on Zetia 10 mg.  She underwent repeat laboratory prior to that telemedicine visit on May 12, 2018 and her total cholesterol increased to 227, triglycerides 914, HDL 27.  Remotely she recalled back in 2015 her triglycerides on 1 occasion had increased to 1305.  During that evaluation her blood pressure was elevated but she had received a hip injection earlier that  morning.  Subsequently, she enrolled in weight watchers.  She dramatically changed her diet and significantly reduced her wine consumption, not drinking at all during the week and only minimally on the weekends.   When I saw her on July 29, 2018 with the above changes to her regimen and with dietary adherence to weight watchers she had lost 22 pounds since May 24, 2018.  Her blood pressure has significantly improved and her dose of olmesartan was reduced due to her calling and stating her blood pressure was getting low.  She underwent recent repeat laboratory which was dramatically improved with total cholesterol improving from 2 27-1 18, triglycerides from 914 to 83, HDL increasing from 27 to 42, and her LDL cholesterol improved to 59.  She feels great.    I last saw her in December 2020 and over the previous 5 months she had continued with her diet .  She has been using weight watchers.  She has been walking at least 3 miles every day.  She no longer drinks wine during the week except on weekends.  She notes more energy.  She denies chest pain PND orthopnea.  She is unaware of palpitations.  She underwent right shoulder surgery replacement on September 17, 2018 and tolerated this well from a cardiovascular standpoint.  She did undergo physical therapy.  Repeat laboratory on December 28, 2018 showed a total cholesterol of 130, HDL 56, LDL 63, and triglycerides were now down to 54.  She feels great.  She is able to use some of her old wardrobe that she has not used in years and she has lost a total of 44 pounds since April.  Her weight at home today was 137 with a peak weight in April at 181.  Of note in 2015 she weighed 198 pounds.  She has had a recent telemedicine visit with Dr. Virgina Savage.  At that time, she was on a reduced dose of olmesartan at 20 mg daily and continue to take spironolactone 12.5 mg.  On this regimen, her blood pressure remained optimal at 125/70.  Since I last saw her, she retired at the end of  December 2021.  She had developed some eye issues and was found to have a total of 5 retinal tears and underwent laser treatment by Dr. Gala Murdoch and at [facility] retinal specialist.  As result she has not been able to do any activity for a minimum of 4 weeks.  Recently, she has been going out to dinner more with friends.  She has noticed her weight has increased as well as her blood pressure.  She has been on olmesartan at the reduced dose of only 10 mg and apparently 6 months ago she ran out of spironolactone.  She has had some issues with cough and had seen Dr. Asencion Noble who recommended Claritin, and Flonase.  She denies chest pain.  She is unaware of palpitations.  She presents for evaluation   Past  Medical History:  Diagnosis Date  . Arthritis   . Depression    denies   . Fatigue    resolved   . Hypercholesterolemia   . Hyperlipidemia    controlled   . Hypertension    controlled  . Hypothyroidism   . Insomnia   . Obesity   . Skin disorder    rosacea    Past Surgical History:  Procedure Laterality Date  . APPENDECTOMY  1976  . BREAST IMPLANT REMOVAL Bilateral    implanted 1996 removed 2002  . CARPAL TUNNEL RELEASE     bilateral   . ENDOMETRIAL FULGURATION  1992  . foot surgery      tumor removed (left)   . HYSTEROSCOPY WITH D & C N/A 06/15/2018   Procedure: DILATATION AND CURETTAGE /HYSTEROSCOPY;  Surgeon: Dian Queen, MD;  Location: [facility];  Service: Gynecology;  Laterality: N/A;  . hysterscopy     . OOPHORECTOMY  1976   left ovary removed   . TOTAL SHOULDER ARTHROPLASTY Right 09/17/2018   Procedure: TOTAL SHOULDER ARTHROPLASTY anatomical;  Surgeon: Netta Cedars, MD;  Location: [facility];  Service: Orthopedics;  Laterality: Right;    Current Medications: Outpatient Medications Prior to Visit  Medication Sig Dispense Refill  . aspirin 81 MG tablet Take 81 mg by mouth daily.      Marland Kitchen azelastine (ASTELIN) 0.1 % nasal spray Place 2 sprays into both  nostrils 2 (two) times daily. Use in each nostril as directed 30 mL 1  . benzonatate (TESSALON) 100 MG capsule Take 1 capsule (100 mg total) by mouth 3 (three) times daily. For cough 90 capsule 4  . calcium citrate-vitamin D 200-200 MG-UNIT TABS Take 2 tablets by mouth daily.     . cholecalciferol (VITAMIN D3) 25 MCG (1000 UT) tablet Take 1,000 Units by mouth daily.    . Choline Fenofibrate (FENOFIBRIC ACID) 135 MG CPDR Take 135 mg by mouth daily. 30 capsule 0  . estradiol (VIVELLE-DOT) 0.025 MG/24HR Place 1 patch onto the skin 2 (two) times a week.      . ezetimibe (ZETIA) 10 MG tablet TAKE ONE TABLET DAILY 90 tablet 3  . levothyroxine (SYNTHROID) 75 MCG tablet Take 75 mcg by mouth daily before breakfast.    . metroNIDAZOLE (METROCREAM) 0.75 % cream Apply 1 application topically 2 (two) times daily.     . multivitamin (THERAGRAN) per tablet Take 1 tablet by mouth daily.      . pantoprazole (PROTONIX) 40 MG tablet Take 1 tablet (40 mg total) by mouth daily before breakfast. For one month then stop 30 tablet 0  . predniSONE (DELTASONE) 10 MG tablet Take 3 tablets daily for 5 days then stop 15 tablet 0  . progesterone (PROMETRIUM) 100 MG capsule Take 100 mg by mouth See admin instructions. Days 1-10 of every month    . progesterone (PROMETRIUM) 100 MG capsule Take 100 mg by mouth at bedtime.    . TURMERIC CURCUMIN PO Take 500 mg by mouth 2 (two) times daily.     Marland Kitchen VASCEPA 1 g capsule TAKE TWO CAPSULES TWICE DAILY 180 capsule 1  . vitamin C (ASCORBIC ACID) 500 MG tablet Take 500 mg by mouth daily.    . Vitamin D, Ergocalciferol, (DRISDOL) 50000 units CAPS capsule Take 50,000 Units by mouth every 30 (thirty) days.     Marland Kitchen olmesartan (BENICAR) 20 MG tablet Take 20 mg by mouth daily. Take 1/2 tablet (95m) daily    . olmesartan (BENICAR)  40 MG tablet TAKE ONE TABLET DAILY (Patient taking differently: Take 20 mg by mouth daily.) 30 tablet 5  . spironolactone (ALDACTONE) 25 MG tablet TAKE ONE-HALF TABLET  DAILY 15 tablet 5  . naphazoline-pheniramine (NAPHCON-A) 0.025-0.3 % ophthalmic solution Naphcon-A 0.025 %-0.3 % eye drops   1 [drp] by ophthalmic route.    . neomycin-polymyxin-hydrocortisone (CORTISPORIN) OTIC solution Apply 1-2 drops to toe BID after soaking 10 mL 1   No facility-administered medications prior to visit.     Allergies:   Patient has no known allergies.   Social History   Socioeconomic History  . Marital status: Divorced    Spouse name: Not on file  . Number of children: Not on file  . Years of education: Not on file  . Highest education level: Not on file  Occupational History  . Occupation: Astronomer: SELF-EMPLOYED  Tobacco Use  . Smoking status: Never Smoker  . Smokeless tobacco: Never Used  Vaping Use  . Vaping Use: Never used  Substance and Sexual Activity  . Alcohol use: Yes    Comment: 2-3 glasses wine daily , 1 drink each weekend   . Drug use: Never  . Sexual activity: Not on file  Other Topics Concern  . Not on file  Social History Narrative  . Not on file   Social Determinants of Health   Financial Resource Strain: Not on file  Food Insecurity: Not on file  Transportation Needs: Not on file  Physical Activity: Not on file  Stress: Not on file  Social Connections: Not on file    Additional social history is that she has been very active in the community.  She serves on the cancer board at [facility].  She had worked in [employer] for 12 years from [phone].  She has worked in Pharmacologist.  She was involved with politics had worked for the [employer].  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  Family History:  The patient's family history includes Arrhythmia in her mother; Asthma in her brother, mother, and paternal aunt; Cancer in her brother and father; Emphysema in her mother.   Her father died at age 73 and was a physician.  He  had laryngeal CA.  Mother died at 69 secondary to arrhythmias.  Her brother died at age 39 secondary to ALL and T-cell lymphoma.  ROS General: Negative; No fevers, chills, or night sweats;  HEENT: Negative; No changes in vision or hearing, sinus congestion, difficulty swallowing Pulmonary: Recent cough, resolved Cardiovascular: Negative; No chest pain, presyncope, syncope, palpitations GI: Negative; No nausea, vomiting, diarrhea, or abdominal pain GU: Negative; No dysuria, hematuria, or difficulty voiding Musculoskeletal: Status post right shoulders replacement September 17, 2018. Hematologic/Oncology: Negative; no easy bruising, bleeding Endocrine: Negative; no heat/cold intolerance; no diabetes Neuro: Negative; no changes in balance, headaches Skin: Negative; No rashes or skin lesions Psychiatric: Negative; No behavioral problems, depression Sleep: Negative; No snoring, daytime sleepiness, hypersomnolence, bruxism, restless legs, hypnogognic hallucinations, no cataplexy Other comprehensive 14 point system review is negative.   PHYSICAL EXAM:   VS:  BP (!) 162/78 (BP Location: Left Arm, Patient Position: Sitting, Cuff Size: Normal)   Pulse 73   Ht 5' 4.5" (1.638 m)   Wt 171 lb 9.6 oz (77.8 kg)   SpO2 97%   BMI 29.00 kg/m     Repeat blood pressure by me remained elevated at 160/80  Wt  Readings from Last 3 Encounters:  05/08/20 171 lb 9.6 oz (77.8 kg)  01/18/19 144 lb 6.4 oz (65.5 kg)  09/17/18 145 lb 15.1 oz (66.2 kg)    General: Alert, oriented, no distress.  Skin: normal turgor, no rashes, warm and dry HEENT: Normocephalic, atraumatic. Pupils equal round and reactive to light; sclera anicteric; extraocular muscles intact; F Nose without nasal septal hypertrophy Mouth/Parynx benign; Mallinpatti scale 3 Neck: No JVD, no carotid bruits; normal carotid upstroke Lungs: clear to ausculatation and percussion; no wheezing or rales Chest wall: without tenderness to palpitation Heart:  PMI not displaced, RRR, s1 s2 normal, 1/6 systolic murmur, no diastolic murmur, no rubs, gallops, thrills, or heaves Abdomen: soft, nontender; no hepatosplenomehaly, BS+; abdominal aorta nontender and not dilated by palpation. Back: no CVA tenderness Pulses 2+ Musculoskeletal: full range of motion, normal strength, no joint deformities Extremities: no clubbing cyanosis or edema, Homan's sign negative  Neurologic: grossly nonfocal; Cranial nerves grossly wnl Psychologic: Normal mood and affect   Studies/Labs Reviewed:   ECG (independently read by me): NSR at 73, LAHB  January 17, 2021 ECG (independently read by me): Normal sinus rhythm at 78 bpm, left axis deviation.  Poor anterior R wave progression.  Normal intervals.  No ectopy.  July 2020 EKG:  EKG is  ordered today.  ECG (independently read by me): Normal sinus rhythm at 74 bpm.  Borderline first-degree AV block with minimal to 202 ms.  QTc interval normal at 437 ms.  October 2019 ECG (independently read by me): Sinus rhythm 84 bpm.  Borderline first-degree AV block with a PR interval 206 ms.  Mild left axis deviation.  No ectopy.  August 31, 2017 ECG (independently read by me): Sinus rhythm at 76 bpm.  Right superior axis.  Normal intervals.  No ectopy.  No ST segment changes.  Recent Labs: BMP Latest Ref Rng & Units 09/18/2018 09/15/2018 07/22/2018  Glucose 70 - 99 mg/dL 147(H) 102(H) 111(H)  BUN 8 - 23 mg/dL '18 14 15  ' Creatinine 0.44 - 1.00 mg/dL 0.56 0.57 0.67  BUN/Creat Ratio 12 - 28 - - 22  Sodium 135 - 145 mmol/L 140 138 135  Potassium 3.5 - 5.1 mmol/L 4.0 4.2 4.3  Chloride 98 - 111 mmol/L 108 108 101  CO2 22 - 32 mmol/L '22 23 22  ' Calcium 8.9 - 10.3 mg/dL 8.9 9.6 9.6     Hepatic Function Latest Ref Rng & Units 07/22/2018 05/12/2018 06/21/2010  Total Protein 6.0 - 8.5 g/dL 6.4 7.0 6.5  Albumin 3.8 - 4.8 g/dL 4.7 4.6 4.0  AST 0 - 40 IU/L '17 22 27  ' ALT 0 - 32 IU/L 19 31 35  Alk Phosphatase 39 - 117 IU/L 38(L) 84 75  Total  Bilirubin 0.0 - 1.2 mg/dL 0.5 0.3 0.5  Bilirubin, Direct 0.0 - 0.3 mg/dL - - 0.1    CBC Latest Ref Rng & Units 09/18/2018 09/15/2018 06/11/2018  WBC 4.0 - 10.5 K/uL - 4.0 5.3  Hemoglobin 12.0 - 15.0 g/dL 10.3(L) 11.7(L) 12.8  Hematocrit 36.0 - 46.0 % 32.1(L) 35.9(L) 37.5  Platelets 150 - 400 K/uL - 230 279   Lab Results  Component Value Date   MCV 93.7 09/15/2018   MCV 93.5 06/11/2018   MCV 90.5 06/21/2010   Lab Results  Component Value Date   TSH 2.60 06/21/2010   No results found for: HGBA1C   BNP No results found for: BNP  ProBNP No results found for: PROBNP   Lipid Panel  Component Value Date/Time   CHOL 118 07/22/2018 0900   TRIG 83 07/22/2018 0900   HDL 42 07/22/2018 0900   CHOLHDL 2.8 07/22/2018 0900   CHOLHDL 4 06/21/2010 0811   VLDL 36.4 06/21/2010 0811   LDLCALC 59 07/22/2018 0900     RADIOLOGY: DG Chest 2 View  Result Date: 05/06/2020 CLINICAL DATA:  Chronic cough. EXAM: CHEST - 2 VIEW COMPARISON:  October 03, 2010. FINDINGS: The heart size and mediastinal contours are within normal limits. Both lungs are clear. The visualized skeletal structures are unremarkable. IMPRESSION: No active cardiopulmonary disease. Electronically Signed   By: Marijo Conception M.D.   On: 05/06/2020 11:00     Additional studies/ records that were reviewed today include:  I reviewed her remote record from Dr. Doreatha Savage in 2012.  I also reviewed her initial consultation at [facility] on July 28, 2017 by Dr. Elenor Savage.  Reviewed her CT calcium score; echo Doppler study, renal duplex scan.  Recent laboratory from December 28, 2018 was reviewed.  Total cholesterol 130, HDL 56, LDL 63, triglycerides 54.  Hemoglobin A1c on July 27, 2018 was 5.1.  Serum creatinine 0.6 on December 28, 2018.  TSH 1.72.  ASSESSMENT:    1. Essential hypertension   2. Mixed hyperlipidemia   3. Hypothyroidism (acquired)   4. Overweight (BMI 25.0-29.9)   5. Chronic cough   6. Left retinal detachment    PLAN:    1.  Essential hypertension: She has a history of hypertension dating back to at least 2012 at which time medical therapy was initiated.  She had stopped medication for several years after she had lost significant weight.  I had reinstituted medication and in April 2020  telemedicine visit blood pressure was still increased on olmesartan 40 mg and spironolactone 12.5 mg daily.  Since that evaluation with her significant weight loss, reduction in alcohol intake as well as increased activity her blood pressure when last evaluated in December 2020, blood pressure was excellent on a reduced dose of olmesartan 10 mg and spironolactone 12.5 mg.  Presently, she has run out of her spironolactone for approximately 6 months.  She has had weight gain and following her retinal detachment has not been able to exercise and has been sedentary for the past 4 weeks.  Her blood pressure today is elevated.  I have recommended further increase of olmesartan back to 20 mg and will resume spironolactone 12.5 mg daily.  She had recently been placed on a prednisone taper in light of her upper respiratory issues and her last dose of prednisone will be today which may have also contributed to her blood pressure elevation.  Repeat laboratory will be obtained next week with a comprehensive metabolic panel, CBC, TSH and lipid studies.  2.  Mixed hyperlipidemia: Historically she had an episode of marked hypertriglyceridemia in 2015 with levels at 1305.  With the initiation of the COVID pandemic and stay at home dietary compliance was significantly less and there was consumption of increased wine compared to previously.  This resulted in significant increase in triglycerides to 914.  When I saw her in December 2020, since May 24, 2018 she had lost 44 pounds and was on a diet her diet with  fish 4 times per week, absence of wine during the week, regular exercise, and avoidance of sugars and sweets as well as doing weight watchers.   Subsequent laboratory showed marked benefit and lipids were excellent with triglyceride at 54, LDL 63, HDL increasing to 56  and LDL at 130.  She will continue with current therapy of fenofibrate 145 mg, Vascepa 2 capsules twice a day in addition to Zetia 10 mg daily.  I will recheck lipid studies next week for follow-up evaluation.  3.  Recent upper respiratory exacerbation with chronic cough: She  recently saw Dr. Asencion Noble.  She has been on a prednisone taper and also has been started on Claritin and Flonase.  4.  Retinal detachment.  She was found to have retinal detachment when seen by Dr. Elmer Sow in place of Dr. Kathrin Penner and was referred to [facility] retinal specialist and received laser treatment for 5 retinal tears.  She has not been able to be active for 4 weeks which has resulted in some additional weight gain.  5.  Recent weight gain: She has had weight fluctuation over the years.  She had purposeful weight loss totaling 44 pounds from April 2020 until December 2020 when her weight decreased to a low of 137 pounds from a peak weight of 181.  Her weight today in the office was 171 although at home was 168.  She will be resuming exercise and is again more cognizant of her diet.  6. Hypothyroidism: Most recent TSH stable at 1.72 on her current dose of levothyroxine 75 mcg daily  7.  Pulmonary nodule: Followed by Dr. Elenor Savage at [facility].  This has been stable.  8.  Status post right shoulder replacement September 17, 2018; completed physical therapy.    Medication Adjustments/Labs and Tests Ordered: Current medicines are reviewed at length with the patient today.  Concerns regarding medicines are outlined above.  Medication changes, Labs and Tests ordered today are listed in the Patient Instructions below. Patient Instructions  Medication Instructions:  INCREASE olmesartan to 34m (1 whole tablet)   *If you need a refill on your cardiac medications before your next appointment,  please call your pharmacy*   Lab Work: FASTING CMet, CBC, TSH, and lipids to be checked in 1 week.   If you have labs (blood work) drawn today and your tests are completely normal, you will receive your results only by: .Marland KitchenMyChart Message (if you have MyChart) OR . A paper copy in the mail If you have any lab test that is abnormal or we need to change your treatment, we will call you to review the results.   Testing/Procedures: None ordered   Follow-Up: [facility] and your health needs are our priority.  As part of our continuing mission to provide you with exceptional heart care, we have created designated Provider Care Teams.  These Care Teams include your primary Cardiologist (physician) and Advanced Practice Providers (APPs -  Physician Assistants and Nurse Practitioners) who all work together to provide you with the care you need, when you need it.  We recommend signing up for the patient portal called "MyChart".  Sign up information is provided on this After Visit Summary.  MyChart is used to connect with patients for Virtual Visits (Telemedicine).  Patients are able to view lab/test results, encounter notes, upcoming appointments, etc.  Non-urgent messages can be sent to your provider as well.   To learn more about what you can do with MyChart, go to [email or web address]    Your next appointment:   4-5 month(s)  The format for your next appointment:   In Person  Provider:   TShelva Majestic MD        Signed, TShelva Majestic MD  05/14/2020 7:09 AM  [place] [address], [place], [place], [place]  [address] Phone: [phone]  At [facility] retinal specialist who found 5 retinal tears and she underwent laser treatment.  She has been an active and unable to exercise for the past 4 weeks resulting in

## 2020-05-08 NOTE — Patient Instructions (Addendum)
Medication Instructions:  INCREASE olmesartan to 20mg  (1 whole tablet)   *If you need a refill on your cardiac medications before your next appointment, please call your pharmacy*   Lab Work: FASTING CMet, CBC, TSH, and lipids to be checked in 1 week.   If you have labs (blood work) drawn today and your tests are completely normal, you will receive your results only by: Marland Kitchen MyChart Message (if you have MyChart) OR . A paper copy in the mail If you have any lab test that is abnormal or we need to change your treatment, we will call you to review the results.   Testing/Procedures: None ordered   Follow-Up: At [facility], you and your health needs are our priority.  As part of our continuing mission to provide you with exceptional heart care, we have created designated Provider Care Teams.  These Care Teams include your primary Cardiologist (physician) and Advanced Practice Providers (APPs -  Physician Assistants and Nurse Practitioners) who all work together to provide you with the care you need, when you need it.  We recommend signing up for the patient portal called "MyChart".  Sign up information is provided on this After Visit Summary.  MyChart is used to connect with patients for Virtual Visits (Telemedicine).  Patients are able to view lab/test results, encounter notes, upcoming appointments, etc.  Non-urgent messages can be sent to your provider as well.   To learn more about what you can do with MyChart, go to [email or web address].    Your next appointment:   4-5 month(s)  The format for your next appointment:   In Person  Provider:   Shelva Majestic, MD

## 2020-05-14 ENCOUNTER — Telehealth: Payer: Self-pay

## 2020-05-14 ENCOUNTER — Encounter: Payer: Self-pay | Admitting: Cardiovascular Disease

## 2020-05-14 NOTE — Telephone Encounter (Signed)
Prior authorization for Vascepa received from [employer] via [email or web address].  CMM Key: [id number] Message sent to PA department for processing.

## 2020-05-14 NOTE — Telephone Encounter (Signed)
**Note De-Identified Stormee Duda Obfuscation** I started a Vascepa PA through covermymeds. KEY: [id number]

## 2020-05-16 LAB — COMPREHENSIVE METABOLIC PANEL
ALT: 24 IU/L (ref 0–32)
AST: 18 IU/L (ref 0–40)
Albumin/Globulin Ratio: 1.7 (ref 1.2–2.2)
Albumin: 4.5 g/dL (ref 3.8–4.8)
Alkaline Phosphatase: 86 IU/L (ref 44–121)
BUN/Creatinine Ratio: 27 (ref 12–28)
BUN: 16 mg/dL (ref 8–27)
Bilirubin Total: 0.3 mg/dL (ref 0.0–1.2)
CO2: 22 mmol/L (ref 20–29)
Calcium: 9.3 mg/dL (ref 8.7–10.3)
Chloride: 102 mmol/L (ref 96–106)
Creatinine, Ser: 0.59 mg/dL (ref 0.57–1.00)
Globulin, Total: 2.6 g/dL (ref 1.5–4.5)
Glucose: 127 mg/dL — ABNORMAL HIGH (ref 65–99)
Potassium: 4.6 mmol/L (ref 3.5–5.2)
Sodium: 139 mmol/L (ref 134–144)
Total Protein: 7.1 g/dL (ref 6.0–8.5)
eGFR: 99 mL/min/{1.73_m2} (ref >59)

## 2020-05-16 LAB — CBC
Hematocrit: 40 % (ref 34.0–46.6)
Hemoglobin: 13.3 g/dL (ref 11.1–15.9)
MCH: 29.7 pg (ref 26.6–33.0)
MCHC: 33.3 g/dL (ref 31.5–35.7)
MCV: 89 fL (ref 79–97)
Platelets: 272 10*3/uL (ref 150–450)
RBC: 4.48 x10E6/uL (ref 3.77–5.28)
RDW: 12.2 % (ref 11.7–15.4)
WBC: 4.7 10*3/uL (ref 3.4–10.8)

## 2020-05-16 LAB — LIPID PANEL
Chol/HDL Ratio: 4 ratio (ref 0.0–4.4)
Cholesterol, Total: 162 mg/dL (ref 100–199)
HDL: 41 mg/dL (ref >39)
LDL Chol Calc (NIH): 81 mg/dL (ref 0–99)
Triglycerides: 242 mg/dL — ABNORMAL HIGH (ref 0–149)
VLDL Cholesterol Cal: 40 mg/dL (ref 5–40)

## 2020-05-16 LAB — TSH: TSH: 6.21 u[IU]/mL — ABNORMAL HIGH (ref 0.450–4.500)

## 2020-05-22 ENCOUNTER — Other Ambulatory Visit: Payer: Self-pay

## 2020-05-22 MED ORDER — LEVOTHYROXINE SODIUM 88 MCG PO TABS: 88.0000 ug | ORAL_TABLET | Freq: Every day | ORAL | 0 refills | Status: DC

## 2020-05-28 ENCOUNTER — Other Ambulatory Visit: Payer: Self-pay | Admitting: Cardiovascular Disease

## 2020-05-30 ENCOUNTER — Other Ambulatory Visit: Payer: Self-pay

## 2020-05-30 ENCOUNTER — Ambulatory Visit (INDEPENDENT_AMBULATORY_CARE_PROVIDER_SITE_OTHER): Payer: Medicare Other | Admitting: Pulmonary Disease

## 2020-05-30 ENCOUNTER — Encounter: Payer: Self-pay | Admitting: Pulmonary Disease

## 2020-05-30 VITALS — BP 122/64 | HR 87 | Ht 64.5 in | Wt 174.0 lb

## 2020-05-30 DIAGNOSIS — R911 Solitary pulmonary nodule: Secondary | ICD-10-CM | POA: Diagnosis not present

## 2020-05-30 DIAGNOSIS — J40 Bronchitis, not specified as acute or chronic: Secondary | ICD-10-CM | POA: Diagnosis not present

## 2020-05-30 DIAGNOSIS — J301 Allergic rhinitis due to pollen: Secondary | ICD-10-CM | POA: Diagnosis not present

## 2020-05-30 NOTE — Progress Notes (Signed)
Synopsis: Referred in May 2022 for cough by Shon Baton, MD  Subjective:   PATIENT ID: [name] GENDER: female DOB: 07-Feb-1955, MRN: [id number]  Chief Complaint  Patient presents with  . Consult    Pt is being referred by Dr. Joya Gaskins due to a cough.  Pt states her cough is worse throughout the day which she wonders if it could be due to allergies.    PMH of arthritis, fatigue, HLD, HTN, insomnia, obesity. She saw Dr. Joya Gaskins several years ago ~8 years. Usually twice per has issues with coughing. Everytime she gets a URI  It goes to her chest. She feels like her symptoms are better now. She saw Dr. Joya Gaskins recently was given steroids and inhaler. Her symptoms are now better. Usually every year Q1 of the year she has bronchitis symptoms. She has allergy testing by Dr. Velora Heckler ~20 years ago. Takes no antihistamines today. She has had them before with loratidine and flonase. Has a poodle in the house. She is allergic to cats and she just cant be around them.    Past Medical History:  Diagnosis Date  . Arthritis   . Depression    denies   . Fatigue    resolved   . Hypercholesterolemia   . Hyperlipidemia    controlled   . Hypertension    controlled  . Hypothyroidism   . Insomnia   . Obesity   . Skin disorder    rosacea     Family History  Problem Relation Age of Onset  . Arrhythmia Mother   . Emphysema Mother   . Asthma Mother   . Cancer Father   . Cancer Brother        leumkemia  . Asthma Brother   . Asthma Paternal Aunt      Past Surgical History:  Procedure Laterality Date  . APPENDECTOMY  1976  . BREAST IMPLANT REMOVAL Bilateral    implanted 1996 removed 2002  . CARPAL TUNNEL RELEASE     bilateral   . ENDOMETRIAL FULGURATION  1992  . foot surgery      tumor removed (left)   . HYSTEROSCOPY WITH D & C N/A 06/15/2018   Procedure: DILATATION AND CURETTAGE /HYSTEROSCOPY;  Surgeon: Dian Queen, MD;  Location: [facility];  Service: Gynecology;   Laterality: N/A;  . hysterscopy     . OOPHORECTOMY  1976   left ovary removed   . TOTAL SHOULDER ARTHROPLASTY Right 09/17/2018   Procedure: TOTAL SHOULDER ARTHROPLASTY anatomical;  Surgeon: Netta Cedars, MD;  Location: [facility];  Service: Orthopedics;  Laterality: Right;    Social History   Socioeconomic History  . Marital status: Divorced    Spouse name: Not on file  . Number of children: Not on file  . Years of education: Not on file  . Highest education level: Not on file  Occupational History  . Occupation: Astronomer: SELF-EMPLOYED  Tobacco Use  . Smoking status: Never Smoker  . Smokeless tobacco: Never Used  Vaping Use  . Vaping Use: Never used  Substance and Sexual Activity  . Alcohol use: Yes    Comment: 2-3 glasses wine daily , 1 drink each weekend   . Drug use: Never  . Sexual activity: Not on file  Other Topics Concern  . Not on file  Social History Narrative  . Not on file   Social Determinants of Health   Financial Resource Strain: Not on file  Food Insecurity:  Not on file  Transportation Needs: Not on file  Physical Activity: Not on file  Stress: Not on file  Social Connections: Not on file  Intimate Partner Violence: Not on file     No Known Allergies   Outpatient Medications Prior to Visit  Medication Sig Dispense Refill  . aspirin 81 MG tablet Take 81 mg by mouth daily.      . calcium citrate-vitamin D 200-200 MG-UNIT TABS Take 2 tablets by mouth daily.     . cholecalciferol (VITAMIN D3) 25 MCG (1000 UT) tablet Take 1,000 Units by mouth daily.    . Choline Fenofibrate (FENOFIBRIC ACID) 135 MG CPDR TAKE ONE CAPSULE EACH DAY 30 capsule 5  . estradiol (VIVELLE-DOT) 0.025 MG/24HR Place 1 patch onto the skin 2 (two) times a week.      . ezetimibe (ZETIA) 10 MG tablet TAKE ONE TABLET DAILY 90 tablet 3  . levothyroxine (SYNTHROID) 88 MCG tablet Take 1 tablet (88 mcg total) by mouth daily before breakfast. 90 tablet 0  .  metroNIDAZOLE (METROCREAM) 0.75 % cream Apply 1 application topically 2 (two) times daily.     . multivitamin (THERAGRAN) per tablet Take 1 tablet by mouth daily.      Marland Kitchen olmesartan (BENICAR) 20 MG tablet Take 1 tablet (20 mg total) by mouth daily. 90 tablet 3  . pantoprazole (PROTONIX) 40 MG tablet Take 1 tablet (40 mg total) by mouth daily before breakfast. For one month then stop 30 tablet 0  . progesterone (PROMETRIUM) 100 MG capsule Take 100 mg by mouth See admin instructions. Days 1-10 of every month    . spironolactone (ALDACTONE) 25 MG tablet Take 0.5 tablets (12.5 mg total) by mouth daily. 45 tablet 3  . TURMERIC CURCUMIN PO Take 500 mg by mouth 2 (two) times daily.     Marland Kitchen VASCEPA 1 g capsule TAKE TWO CAPSULES TWICE DAILY 180 capsule 1  . vitamin C (ASCORBIC ACID) 500 MG tablet Take 500 mg by mouth daily.    . Vitamin D, Ergocalciferol, (DRISDOL) 50000 units CAPS capsule Take 50,000 Units by mouth every 30 (thirty) days.     Marland Kitchen zinc gluconate 50 MG tablet Take 50 mg by mouth daily.    Marland Kitchen azelastine (ASTELIN) 0.1 % nasal spray Place 2 sprays into both nostrils 2 (two) times daily. Use in each nostril as directed (Patient not taking: Reported on 05/30/2020) 30 mL 1  . benzonatate (TESSALON) 100 MG capsule Take 1 capsule (100 mg total) by mouth 3 (three) times daily. For cough (Patient not taking: Reported on 05/30/2020) 90 capsule 4  . predniSONE (DELTASONE) 10 MG tablet Take 3 tablets daily for 5 days then stop 15 tablet 0  . progesterone (PROMETRIUM) 100 MG capsule Take 100 mg by mouth at bedtime.     No facility-administered medications prior to visit.    Review of Systems  Constitutional: Negative for chills, fever, malaise/fatigue and weight loss.  HENT: Negative for hearing loss, sore throat and tinnitus.   Eyes: Negative for blurred vision and double vision.  Respiratory: Negative for cough, hemoptysis, sputum production, shortness of breath, wheezing and stridor.   Cardiovascular:  Negative for chest pain, palpitations, orthopnea, leg swelling and PND.  Gastrointestinal: Negative for abdominal pain, constipation, diarrhea, heartburn, nausea and vomiting.  Genitourinary: Negative for dysuria, hematuria and urgency.  Musculoskeletal: Negative for joint pain and myalgias.  Skin: Negative for itching and rash.  Neurological: Negative for dizziness, tingling, weakness and headaches.  Endo/Heme/Allergies: Negative for  environmental allergies. Does not bruise/bleed easily.  Psychiatric/Behavioral: Negative for depression. The patient is not nervous/anxious and does not have insomnia.   All other systems reviewed and are negative.    Objective:  Physical Exam Vitals reviewed.  Constitutional:      General: She is not in acute distress.    Appearance: She is well-developed.  HENT:     Head: Normocephalic and atraumatic.  Eyes:     General: No scleral icterus.    Conjunctiva/sclera: Conjunctivae normal.     Pupils: Pupils are equal, round, and reactive to light.  Neck:     Vascular: No JVD.     Trachea: No tracheal deviation.  Cardiovascular:     Rate and Rhythm: Normal rate and regular rhythm.     Heart sounds: Normal heart sounds. No murmur heard.   Pulmonary:     Effort: Pulmonary effort is normal. No tachypnea, accessory muscle usage or respiratory distress.     Breath sounds: Normal breath sounds. No stridor. No wheezing, rhonchi or rales.  Abdominal:     General: Bowel sounds are normal. There is no distension.     Palpations: Abdomen is soft.     Tenderness: There is no abdominal tenderness.  Musculoskeletal:        General: No tenderness.     Cervical back: Neck supple.  Lymphadenopathy:     Cervical: No cervical adenopathy.  Skin:    General: Skin is warm and dry.     Capillary Refill: Capillary refill takes less than 2 seconds.     Findings: No rash.  Neurological:     Mental Status: She is alert and oriented to person, place, and time.   Psychiatric:        Behavior: Behavior normal.      Vitals:   05/30/20 1406  BP: 122/64  Pulse: 87  SpO2: 98%  Weight: 174 lb (78.9 kg)  Height: 5' 4.5" (1.638 m)   98% on RA BMI Readings from Last 3 Encounters:  05/30/20 29.41 kg/m  05/08/20 29.00 kg/m  01/18/19 24.40 kg/m   Wt Readings from Last 3 Encounters:  05/30/20 174 lb (78.9 kg)  05/08/20 171 lb 9.6 oz (77.8 kg)  01/18/19 144 lb 6.4 oz (65.5 kg)     CBC    Component Value Date/Time   WBC 4.7 05/16/2020 0722   WBC 4.0 09/15/2018 1002   RBC 4.48 05/16/2020 0722   RBC 3.83 (L) 09/15/2018 1002   HGB 13.3 05/16/2020 0722   HCT 40.0 05/16/2020 0722   PLT 272 05/16/2020 0722   MCV 89 05/16/2020 0722   MCH 29.7 05/16/2020 0722   MCH 30.5 09/15/2018 1002   MCHC 33.3 05/16/2020 0722   MCHC 32.6 09/15/2018 1002   RDW 12.2 05/16/2020 0722   LYMPHSABS 1.5 06/21/2010 0811   MONOABS 0.3 06/21/2010 0811   EOSABS 0.1 06/21/2010 0811   BASOSABS 0.0 06/21/2010 0811    Chest Imaging: 05/04/2020 chest x-ray: No infiltrate, no effusion The patient's images have been independently reviewed by me.    Pulmonary Functions Testing Results: No flowsheet data found.  FeNO: none  Pathology: none  Echocardiogram: none  Heart Catheterization: none    Assessment & Plan:     ICD-10-CM   1. Seasonal allergic rhinitis due to pollen  J30.1   2. Pulmonary nodule, right  R91.1    Stable, followed at [facility] since 2019, no need for follow up  3. Bronchitis  J40     Discussion:  This is a 65 year old female, with complaints of recurrent URI, bronchitis type symptoms.  She does have seasonal allergies.  She was recently treated with steroids and had significant improvement in symptoms.  She does state that this usually comes around once a year in the early spring where she develops chest symptoms and persistent cough.  Last year she did not have the symptoms but it did occur this year.  Overall she feels much better than  before.  Plan: Today in the office we discussed potential diagnosis of mild asthma.  She does have improvement with bronchodilators and steroids when these flareups occur.  She does have allergies. We also discussed that if symptoms continue to recur would consider RAST testing plus IgE. We also discussed utility of daily maintenance inhaler therapy to prevent exacerbations. At this time patient is asymptomatic and would prefer to observe her symptoms.  If they do recur she is going to let us know. In the future may need to consider Rast panel, +/- PFTs. She is agreeable to this plan.    Current Outpatient Medications:  .  aspirin 81 MG tablet, Take 81 mg by mouth daily.  , Disp: , Rfl:  .  calcium citrate-vitamin D 200-200 MG-UNIT TABS, Take 2 tablets by mouth daily. , Disp: , Rfl:  .  cholecalciferol (VITAMIN D3) 25 MCG (1000 UT) tablet, Take 1,000 Units by mouth daily., Disp: , Rfl:  .  Choline Fenofibrate (FENOFIBRIC ACID) 135 MG CPDR, TAKE ONE CAPSULE EACH DAY, Disp: 30 capsule, Rfl: 5 .  estradiol (VIVELLE-DOT) 0.025 MG/24HR, Place 1 patch onto the skin 2 (two) times a week.  , Disp: , Rfl:  .  ezetimibe (ZETIA) 10 MG tablet, TAKE ONE TABLET DAILY, Disp: 90 tablet, Rfl: 3 .  levothyroxine (SYNTHROID) 88 MCG tablet, Take 1 tablet (88 mcg total) by mouth daily before breakfast., Disp: 90 tablet, Rfl: 0 .  metroNIDAZOLE (METROCREAM) 0.75 % cream, Apply 1 application topically 2 (two) times daily. , Disp: , Rfl:  .  multivitamin (THERAGRAN) per tablet, Take 1 tablet by mouth daily.  , Disp: , Rfl:  .  olmesartan (BENICAR) 20 MG tablet, Take 1 tablet (20 mg total) by mouth daily., Disp: 90 tablet, Rfl: 3 .  pantoprazole (PROTONIX) 40 MG tablet, Take 1 tablet (40 mg total) by mouth daily before breakfast. For one month then stop, Disp: 30 tablet, Rfl: 0 .  progesterone (PROMETRIUM) 100 MG capsule, Take 100 mg by mouth See admin instructions. Days 1-10 of every month, Disp: , Rfl:  .   spironolactone (ALDACTONE) 25 MG tablet, Take 0.5 tablets (12.5 mg total) by mouth daily., Disp: 45 tablet, Rfl: 3 .  TURMERIC CURCUMIN PO, Take 500 mg by mouth 2 (two) times daily. , Disp: , Rfl:  .  VASCEPA 1 g capsule, TAKE TWO CAPSULES TWICE DAILY, Disp: 180 capsule, Rfl: 1 .  vitamin C (ASCORBIC ACID) 500 MG tablet, Take 500 mg by mouth daily., Disp: , Rfl:  .  Vitamin D, Ergocalciferol, (DRISDOL) 50000 units CAPS capsule, Take 50,000 Units by mouth every 30 (thirty) days. , Disp: , Rfl:  .  zinc gluconate 50 MG tablet, Take 50 mg by mouth daily., Disp: , Rfl:  .  azelastine (ASTELIN) 0.1 % nasal spray, Place 2 sprays into both nostrils 2 (two) times daily. Use in each nostril as directed (Patient not taking: Reported on 05/30/2020), Disp: 30 mL, Rfl: 1 .  benzonatate (TESSALON) 100 MG capsule, Take 1 capsule (100 mg  total) by mouth 3 (three) times daily. For cough (Patient not taking: Reported on 05/30/2020), Disp: 90 capsule, Rfl: 4   Garner Nash, DO [facility] 05/30/2020 2:16 PM

## 2020-05-30 NOTE — Patient Instructions (Signed)
Thank you for visiting Dr. Valeta Harms at [facility]. Today we recommend the following:  Call us if symptoms change or you need anything   Return in about 1 year (around 05/30/2021) for with APP or Dr. Valeta Harms.    Please do your part to reduce the spread of COVID-19.

## 2020-06-28 NOTE — Telephone Encounter (Signed)
**Note De-Identified Governor Matos Obfuscation** This Vascepa PA was cancelled per covermymeds but no reason given.  I called [facility] November to get info from card the pts Vascepa RX is being ran under and was advised by pharmacist that they are running under [place] Medicare: ID: [id number]  [id number] PCN: [place] GRP: [id number]  The pharmacist states that the pt picked up her Vascepa on 5/26 and her co-pay was $30/120 tablets and that no PA was or is required.

## 2020-08-21 ENCOUNTER — Other Ambulatory Visit: Payer: Self-pay | Admitting: Cardiovascular Disease

## 2020-09-24 ENCOUNTER — Ambulatory Visit: Payer: Medicare Other | Admitting: Cardiovascular Disease

## 2020-10-29 ENCOUNTER — Ambulatory Visit: Payer: Medicare Other | Admitting: Podiatry

## 2020-11-07 ENCOUNTER — Telehealth: Payer: Self-pay | Admitting: Pulmonary Disease

## 2020-11-07 ENCOUNTER — Ambulatory Visit: Payer: Medicare Other | Admitting: Podiatry

## 2020-11-07 ENCOUNTER — Encounter: Payer: Self-pay | Admitting: Podiatry

## 2020-11-07 ENCOUNTER — Ambulatory Visit (INDEPENDENT_AMBULATORY_CARE_PROVIDER_SITE_OTHER): Payer: Medicare Other | Admitting: Podiatry

## 2020-11-07 ENCOUNTER — Other Ambulatory Visit: Payer: Self-pay

## 2020-11-07 DIAGNOSIS — L600 Ingrowing nail: Secondary | ICD-10-CM

## 2020-11-07 DIAGNOSIS — R87619 Unspecified abnormal cytological findings in specimens from cervix uteri: Secondary | ICD-10-CM | POA: Insufficient documentation

## 2020-11-07 DIAGNOSIS — D3610 Benign neoplasm of peripheral nerves and autonomic nervous system, unspecified: Secondary | ICD-10-CM | POA: Diagnosis not present

## 2020-11-07 DIAGNOSIS — L989 Disorder of the skin and subcutaneous tissue, unspecified: Secondary | ICD-10-CM | POA: Insufficient documentation

## 2020-11-07 DIAGNOSIS — M1990 Unspecified osteoarthritis, unspecified site: Secondary | ICD-10-CM | POA: Insufficient documentation

## 2020-11-07 NOTE — Telephone Encounter (Signed)
Garner Nash, DO  [facility] Triage Pool; [place], [place], LPN 10 minutes ago (5:68 PM)    PCCM:   Lets start with Symbicort 160 prescription, 2 puffs, twice daily.  If she has her albuterol inhaler she can use this as needed.  Prednisone 40 mg x 5 days  Acute visit to follow-up in the office if she is not getting any better in a few days   Garner Nash, DO  [facility]  11/07/2020 5:07 PM    Attempted to call pt but unable to reach. Let message for her to return call.

## 2020-11-07 NOTE — Telephone Encounter (Signed)
Call made to patient, confirmed DOB. Patient [place]. She has developed a dry cough. She has  tried delsym cough medicine and zycam. Denies any other allergy symptoms stating she only has a cough and minimal sinus congestion. Denies fever. Was told to call in next time this happens. She denies all other symptoms. Thinks she may have a allergy to grass.   BI please advise.

## 2020-11-08 MED ORDER — BUDESONIDE-FORMOTEROL FUMARATE 160-4.5 MCG/ACT IN AERO: 2.0000 | INHALATION_SPRAY | Freq: Two times a day (BID) | RESPIRATORY_TRACT | 6 refills | Status: DC

## 2020-11-08 MED ORDER — PREDNISONE 20 MG PO TABS: 40.0000 mg | ORAL_TABLET | Freq: Every day | ORAL | 0 refills | Status: DC

## 2020-11-08 MED ORDER — ALBUTEROL SULFATE HFA 108 (90 BASE) MCG/ACT IN AERS: 2.0000 | INHALATION_SPRAY | Freq: Four times a day (QID) | RESPIRATORY_TRACT | 6 refills | Status: DC | PRN

## 2020-11-08 NOTE — Telephone Encounter (Signed)
Spoke with the pt and notified of response  She no longer has albuterol and wanted this sent  All rxs sent to pharm  She will call for appt if not improving

## 2020-11-08 NOTE — Progress Notes (Signed)
Subjective:   Patient ID: Megan Savage, female   DOB: 65 y.o.   MRN: [id number]   HPI Patient presents with 2 complaints with 1 being a shooting like discomfort between her right third and fourth toes and secondarily the possibility for a chronic ingrown toenail of her right big toe that she works on herself.  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  It is localized to this area and is moderately inflamed but not severe and seems to hurt her only in certain shoes with a incurvated right hallux medial border with no redness or drainage noted     Assessment:  Possibility for neuroma-like symptomatology right along with ingrown toenail deformity right hallux medial border     Plan:  H&P x-ray reviewed conditions discussed.  I am hopeful we can keep this under control and it will not require surgery like the left 1 and I did do sterile prep and injected the third interspace with a combination of Xylocaine and Marcaine and Kenalog to try to reduce the inflammatory process associated with this nerve.  I then discussed the ingrown toenail and reviewed correction I do think at 1 point in the future that may need to be done but at this point she will just use soaks and pedicures and hopefully keep it under control  X-rays indicate that there is no signs of calcification or arthritis or other bone pathology associated with this

## 2020-11-09 ENCOUNTER — Ambulatory Visit (INDEPENDENT_AMBULATORY_CARE_PROVIDER_SITE_OTHER): Payer: Medicare Other | Admitting: Primary Care

## 2020-11-09 ENCOUNTER — Other Ambulatory Visit: Payer: Self-pay

## 2020-11-09 ENCOUNTER — Encounter: Payer: Self-pay | Admitting: Primary Care

## 2020-11-09 VITALS — BP 124/68 | HR 94 | Temp 98.2°F | Ht 64.0 in | Wt 175.8 lb

## 2020-11-09 DIAGNOSIS — J4521 Mild intermittent asthma with (acute) exacerbation: Secondary | ICD-10-CM | POA: Insufficient documentation

## 2020-11-09 DIAGNOSIS — J301 Allergic rhinitis due to pollen: Secondary | ICD-10-CM

## 2020-11-09 DIAGNOSIS — J40 Bronchitis, not specified as acute or chronic: Secondary | ICD-10-CM

## 2020-11-09 MED ORDER — METHYLPREDNISOLONE ACETATE 80 MG/ML IJ SUSP
80.0000 mg | Freq: Once | INTRAMUSCULAR | Status: AC
  Administered 2020-11-09: 80 mg via INTRAMUSCULAR

## 2020-11-09 MED ORDER — AZELASTINE HCL 0.1 % NA SOLN: 2.0000 | Freq: Two times a day (BID) | NASAL | 1 refills | Status: DC

## 2020-11-09 MED ORDER — LORATADINE 10 MG PO TABS
10.0000 mg | ORAL_TABLET | Freq: Every day | ORAL | 3 refills | Status: DC
Start: 2020-11-09 — End: 2022-06-05

## 2020-11-09 MED ORDER — AZITHROMYCIN 250 MG PO TABS: ORAL_TABLET | ORAL | 0 refills | Status: DC

## 2020-11-09 NOTE — Assessment & Plan Note (Signed)
-   Resume Loratidine 10mg  daily and Astelin nasal spray

## 2020-11-09 NOTE — Patient Instructions (Addendum)
  Symptoms are consistent with asthmatic bronchitis  Recommendations: - Use maintenance inhaler (Symbicort) twice daily as prescribed to prevent exacerbations (rinse mouth after use) - Start Claritin 10mg  daily  - Resume Astelin nasal spray  - Take Delsym twice daily along with tessalon perles  - Take Zpack if not better in 3-5 days   Orders: - Depo-medrol 80mg  IM x 1 - Labs today  - Please give patient a spacer to use with Symbicort   Rx: - Zpack   Follow-up: - 1 week video visit with [facility]

## 2020-11-09 NOTE — Progress Notes (Signed)
@Patient  ID: Megan Savage, female    DOB: 1955-12-15, 65 y.o.   MRN: [id number]  Chief Complaint  Patient presents with   Follow-up    Pt. Says she's wheezing, coughing, and can't breathe too good.     Referring provider: Shon Baton, MD  HPI: 65 year old female, never smoked. PMH significant for seasonal allergic rhinitis, possible asthma, chronic cough, HTN, hyperlipidemia, hyperlipidemia, pulmonary nodule. Patient of Dr. Valeta Harms, last seen on 05/30/20.   11/09/2020- Interim hx  Patient presents today for acute OV. She called our office on 11/07/20 for allergy symptoms. She reports having a dry along with wheezing and PND x 1 week. She was instructed by Dr. Valeta Harms to start Symbicort 161mcg two puffs twice daily and was sent in RX for prednisone 40mg  x 5 days. She picked up her inhaler yesterday and has been using as directed. She is not currently taking any over the counter antihistamines or nasal sprays.     No Known Allergies  Immunization History  Administered Date(s) Administered   Influenza-Unspecified 10/03/2017, 09/28/2019   PFIZER(Purple Top)SARS-COV-2 Vaccination 10/13/2018, 02/17/2019, 03/09/2019, 10/13/2019   Tdap 01/28/2012   Zoster Recombinat (Shingrix) 07/30/2017, 09/28/2017   Zoster, Live 10/27/2017    Past Medical History:  Diagnosis Date   Arthritis    Depression    denies    Fatigue    resolved    Hypercholesterolemia    Hyperlipidemia    controlled    Hypertension    controlled   Hypothyroidism    Insomnia    Obesity    Skin disorder    rosacea    Tobacco History: Social History   Tobacco Use  Smoking Status Never  Smokeless Tobacco Never   Counseling given: Not Answered   Outpatient Medications Prior to Visit  Medication Sig Dispense Refill   albuterol (VENTOLIN HFA) 108 (90 Base) MCG/ACT inhaler Inhale 2 puffs into the lungs every 6 (six) hours as needed for wheezing or shortness of breath. 8 g 6   benzonatate (TESSALON) 100 MG  capsule Take 1 capsule (100 mg total) by mouth 3 (three) times daily. For cough 90 capsule 4   budesonide-formoterol (SYMBICORT) 160-4.5 MCG/ACT inhaler Inhale 2 puffs into the lungs in the morning and at bedtime. 1 each 6   calcium citrate-vitamin D 200-200 MG-UNIT TABS Take 2 tablets by mouth daily.      cholecalciferol (VITAMIN D3) 25 MCG (1000 UT) tablet Take 1,000 Units by mouth daily.     Choline Fenofibrate (FENOFIBRIC ACID) 135 MG CPDR TAKE ONE CAPSULE EACH DAY 30 capsule 5   estradiol (VIVELLE-DOT) 0.025 MG/24HR Place 1 patch onto the skin 2 (two) times a week.       ezetimibe (ZETIA) 10 MG tablet TAKE ONE TABLET DAILY 90 tablet 3   levothyroxine (SYNTHROID) 88 MCG tablet TAKE ONE TABLET DAILY BEFORE BREAKFAST 90 tablet 0   metroNIDAZOLE (METROCREAM) 0.75 % cream Apply 1 application topically 2 (two) times daily.      multivitamin (THERAGRAN) per tablet Take 1 tablet by mouth daily.       naproxen sodium (ALEVE) 220 MG tablet Aleve 220 mg tablet     olmesartan (BENICAR) 20 MG tablet Take 1 tablet (20 mg total) by mouth daily. 90 tablet 3   pantoprazole (PROTONIX) 40 MG tablet Take 1 tablet (40 mg total) by mouth daily before breakfast. For one month then stop 30 tablet 0   predniSONE (DELTASONE) 20 MG tablet Take 2 tablets (40 mg  total) by mouth daily with breakfast. 10 tablet 0   progesterone (PROMETRIUM) 100 MG capsule Take 100 mg by mouth See admin instructions. Days 1-10 of every month     spironolactone (ALDACTONE) 25 MG tablet Take 0.5 tablets (12.5 mg total) by mouth daily. 45 tablet 3   TURMERIC CURCUMIN PO Take 500 mg by mouth 2 (two) times daily.      VASCEPA 1 g capsule TAKE TWO CAPSULES TWICE DAILY 180 capsule 1   vitamin C (ASCORBIC ACID) 500 MG tablet Take 500 mg by mouth daily.     Vitamin D, Ergocalciferol, (DRISDOL) 50000 units CAPS capsule Take 50,000 Units by mouth every 30 (thirty) days.      Zinc Citrate-Phytase (ZYTAZE) 25-500 MG CAPS Take by mouth.     zinc  gluconate 50 MG tablet Take 50 mg by mouth daily.     aspirin 81 MG tablet Take 81 mg by mouth daily.   (Patient not taking: Reported on 11/09/2020)     azelastine (ASTELIN) 0.1 % nasal spray Place 2 sprays into both nostrils 2 (two) times daily. Use in each nostril as directed (Patient not taking: Reported on 11/09/2020) 30 mL 1   No facility-administered medications prior to visit.    Review of Systems  Review of Systems  Constitutional: Negative.   HENT:  Positive for congestion and postnasal drip.   Respiratory:  Positive for cough.   Cardiovascular: Negative.     Physical Exam  BP 124/68 (BP Location: Left Arm, Patient Position: Sitting, Cuff Size: Normal)   Pulse 94   Temp 98.2 F (36.8 C) (Oral)   Ht 5\' 4"  (1.626 m)   Wt 175 lb 12.8 oz (79.7 kg)   SpO2 97%   BMI 30.18 kg/m  Physical Exam Constitutional:      Appearance: Normal appearance.  HENT:     Head: Normocephalic and atraumatic.  Cardiovascular:     Rate and Rhythm: Normal rate and regular rhythm.  Pulmonary:     Effort: Pulmonary effort is normal. No respiratory distress.     Breath sounds: Normal breath sounds. No wheezing, rhonchi or rales.  Neurological:     General: No focal deficit present.     Mental Status: She is alert and oriented to person, place, and time. Mental status is at baseline.  Psychiatric:        Mood and Affect: Mood normal.        Behavior: Behavior normal.        Thought Content: Thought content normal.        Judgment: Judgment normal.     Lab Results:  CBC    Component Value Date/Time   WBC 4.7 05/16/2020 0722   WBC 4.0 09/15/2018 1002   RBC 4.48 05/16/2020 0722   RBC 3.83 (L) 09/15/2018 1002   HGB 13.3 05/16/2020 0722   HCT 40.0 05/16/2020 0722   PLT 272 05/16/2020 0722   MCV 89 05/16/2020 0722   MCH 29.7 05/16/2020 0722   MCH 30.5 09/15/2018 1002   MCHC 33.3 05/16/2020 0722   MCHC 32.6 09/15/2018 1002   RDW 12.2 05/16/2020 0722   LYMPHSABS 1.5 06/21/2010 0811    MONOABS 0.3 06/21/2010 0811   EOSABS 0.1 06/21/2010 0811   BASOSABS 0.0 06/21/2010 0811    BMET    Component Value Date/Time   NA 139 05/16/2020 0722   K 4.6 05/16/2020 0722   CL 102 05/16/2020 0722   CO2 22 05/16/2020 0722   GLUCOSE 127 (  H) 05/16/2020 0722   GLUCOSE 147 (H) 09/18/2018 0224   BUN 16 05/16/2020 0722   CREATININE 0.59 05/16/2020 0722   CALCIUM 9.3 05/16/2020 0722   GFRNONAA >60 09/18/2018 0224   GFRAA >60 09/18/2018 0224    BNP No results found for: BNP  ProBNP No results found for: PROBNP  Imaging: No results found.   Assessment & Plan:   Mild intermittent acute asthmatic bronchitis - Patient has had a dry cough with associated wheezing and PND x 1 week - Continue Symbicort 185mcg 2 puffs BID and prednisone 40mg  x 5 days - Received depo-medrol 80mg  IM x1 in office today - Sending in zpack to have on hand if symptoms do not improve in 3-5 days  - We will check RAST allergy panel and IgE - Consider PFTs in future if continues to have recurrent symptoms   Seasonal allergic rhinitis due to pollen - Resume Loratidine 10mg  daily and Astelin nasal spray    Martyn Ehrich, NP 11/09/2020

## 2020-11-09 NOTE — Assessment & Plan Note (Signed)
-   Patient has had a dry cough with associated wheezing and PND x 1 week - Continue Symbicort 134mcg 2 puffs BID and prednisone 40mg  x 5 days - Received depo-medrol 80mg  IM x1 in office today - Sending in zpack to have on hand if symptoms do not improve in 3-5 days  - We will check RAST allergy panel and IgE - Consider PFTs in future if continues to have recurrent symptoms

## 2020-11-14 LAB — RESPIRATORY ALLERGY PROFILE REGION II ~~LOC~~
Allergen, A. alternata, m6: <0.1 kU/L
Allergen, Cedar tree, t12: <0.1 kU/L
Allergen, Comm Silver Birch, t9: <0.1 kU/L
Allergen, Cottonwood, t14: <0.1 kU/L
Allergen, D pternoyssinus,d7: 0.26 kU/L — ABNORMAL HIGH
Allergen, Mouse Urine Protein, e78: <0.1 kU/L
Allergen, Mulberry, t76: <0.1 kU/L
Allergen, Oak,t7: <0.1 kU/L
Allergen, P. notatum, m1: <0.1 kU/L
Aspergillus fumigatus, m3: <0.1 kU/L
Bermuda Grass: <0.1 kU/L
Box Elder IgE: <0.1 kU/L
CLADOSPORIUM HERBARUM (M2) IGE: <0.1 kU/L
COMMON RAGWEED (SHORT) (W1) IGE: <0.1 kU/L
Cat Dander: 2.6 kU/L — ABNORMAL HIGH
Class: 0
Class: 0
Class: 0
Class: 0
Class: 0
Class: 0
Class: 0
Class: 0
Class: 0
Class: 0
Class: 0
Class: 0
Class: 0
Class: 0
Class: 0
Class: 0
Class: 0
Class: 0
Class: 0
Class: 2
Cockroach: 0.12 kU/L — ABNORMAL HIGH
D. farinae: 0.3 kU/L — ABNORMAL HIGH
Dog Dander: 0.22 kU/L — ABNORMAL HIGH
Elm IgE: <0.1 kU/L
IgE (Immunoglobulin E), Serum: 44 kU/L (ref <=114)
Johnson Grass: <0.1 kU/L
Pecan/Hickory Tree IgE: <0.1 kU/L
Rough Pigweed  IgE: <0.1 kU/L
Sheep Sorrel IgE: <0.1 kU/L
Timothy Grass: <0.1 kU/L

## 2020-11-14 LAB — INTERPRETATION:

## 2020-11-16 ENCOUNTER — Telehealth (INDEPENDENT_AMBULATORY_CARE_PROVIDER_SITE_OTHER): Payer: Medicare Other | Admitting: Primary Care

## 2020-11-16 ENCOUNTER — Encounter: Payer: Self-pay | Admitting: Primary Care

## 2020-11-16 VITALS — BP 140/70 | HR 86 | Temp 98.6°F | Ht 64.0 in | Wt 168.0 lb

## 2020-11-16 DIAGNOSIS — J301 Allergic rhinitis due to pollen: Secondary | ICD-10-CM | POA: Diagnosis not present

## 2020-11-16 DIAGNOSIS — J4520 Mild intermittent asthma, uncomplicated: Secondary | ICD-10-CM | POA: Diagnosis not present

## 2020-11-16 NOTE — Progress Notes (Signed)
Virtual Visit via Video Note  I connected with Megan Savage on 11/16/20 at  3:00 PM EDT by a video enabled telemedicine application and verified that I am speaking with the correct person using two identifiers.  Location: Patient: Home Provider: Office   I discussed the limitations of evaluation and management by telemedicine and the availability of in person appointments. The patient expressed understanding and agreed to proceed.  History of Present Illness: 65 year old female, never smoked. PMH significant for seasonal allergic rhinitis, possible asthma, chronic cough, HTN, hyperlipidemia, hyperlipidemia, pulmonary nodule. Patient of Dr. Valeta Harms, last seen on 05/30/20.   Previous [facility] pulmonary encounter:  11/09/2020 Patient presents today for acute OV. She called our office on 11/07/20 for allergy symptoms. She reports having a dry along with wheezing and PND x 1 week. She was instructed by Dr. Valeta Harms to start Symbicort 174mcg two puffs twice daily and was sent in RX for prednisone 40mg  x 5 days. She picked up her inhaler yesterday and has been using as directed. She is not currently taking any over the counter antihistamines or nasal sprays.   11/16/2020- Interim hx  Patient contacted today for follow-up asthma. She was seen last week for acute exacerbation of her asthma. At that time she had symptoms of dry cough with associated wheezing and PND. She received depo-medrol 80mg  IM injection in office and was prescribed prednisone 40mg  x 5 days along with zpack to have on hand. Maintained on Symbicort 169mcg two puffs twice daily.   She is feeling better. Cough is no longer debilitating. 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 She is seeing a family friend next week who is a pulmonologist. Allergy panel positive for class I dust mites, class II cat dander, class I dog dander, class 1 cockroach, IgE 44.     Observations/Objective:  - Appears well; No overt shortness of breath, wheezing or cough   Assessment and Plan:  Allergic asthma: - Improved; Treated for acute exacerbation on 11/09/20 with Zpack, depo-medrol 80mg  IM and prednisone taper - Recommend patient continue Symbicort 159mcg 1-2 puffs twice daily - RAST + dust mites, cat dander, dog dander and cockroach - Encourage allergen avoidance  - Consider getting PFTs if symptoms reoccur  Seasonal allergic rhinitis: - Continue Loratadine 10mg  daily and Astelin nasal spray - Consider adding Singulair in the future if symptoms persist   Follow Up Instructions:   - 6 months with Dr. Valeta Harms  I discussed the assessment and treatment plan with the patient. The patient was provided an opportunity to ask questions and all were answered. The patient agreed with the plan and demonstrated an understanding of the instructions.   The patient was advised to call back or seek an in-person evaluation if the symptoms worsen or if the condition fails to improve as anticipated.  I provided 22 minutes of non-face-to-face time during this encounter.   Martyn Ehrich, NP

## 2020-11-19 ENCOUNTER — Other Ambulatory Visit: Payer: Self-pay | Admitting: Cardiovascular Disease

## 2020-11-19 ENCOUNTER — Encounter: Payer: Self-pay | Admitting: Primary Care

## 2020-11-19 NOTE — Patient Instructions (Signed)
Continue Symbicort 121mcg 1-2 puffs twice daily  Continue Claritin and Astelin nasal spray Discuss adding Singulair and getting PFTs with new pulmonologist if needed  Follow-up in 6 months with Dr. Valeta Harms or sooner if needed

## 2021-01-22 ENCOUNTER — Other Ambulatory Visit: Payer: Self-pay | Admitting: Internal Medicine

## 2021-01-22 DIAGNOSIS — Z1231 Encounter for screening mammogram for malignant neoplasm of breast: Secondary | ICD-10-CM

## 2021-01-28 ENCOUNTER — Other Ambulatory Visit: Payer: Self-pay | Admitting: Cardiovascular Disease

## 2021-01-31 ENCOUNTER — Ambulatory Visit: Payer: Medicare Other | Admitting: Cardiovascular Disease

## 2021-02-04 ENCOUNTER — Ambulatory Visit (INDEPENDENT_AMBULATORY_CARE_PROVIDER_SITE_OTHER): Payer: Medicare Other | Admitting: Podiatry

## 2021-02-04 ENCOUNTER — Other Ambulatory Visit: Payer: Self-pay

## 2021-02-04 ENCOUNTER — Encounter: Payer: Self-pay | Admitting: Podiatry

## 2021-02-04 DIAGNOSIS — L600 Ingrowing nail: Secondary | ICD-10-CM

## 2021-02-04 NOTE — Progress Notes (Signed)
Subjective:   Patient ID: Megan Savage, female   DOB: 66 y.o.   MRN: [id number]   HPI Patient presents stating my nail on my right has been very sore and I have tried to work with that but I think I need to get it taken care of   ROS      Objective:  Physical Exam  Neurovascular status intact incurvated right hallux medial border painful when pressed making shoe gear difficult     Assessment:  Chronic ingrown toenail deformity right hallux medial border     Plan:  H&P reviewed condition recommended correction explained procedure risk and patient wants surgery.  I allowed her to sign consent form understanding risk and I infiltrated the right hallux 60 mg like Marcaine mixture sterile prep done and using sterile instrumentation removed the medial border exposed matrix applied phenol 3 applications 30 seconds followed by alcohol lavage sterile dressing.  Gave instructions on soaks and reappoint and encouraged her to call with questions or issues

## 2021-02-04 NOTE — Patient Instructions (Signed)

## 2021-02-12 ENCOUNTER — Encounter: Payer: Self-pay | Admitting: Podiatry

## 2021-02-15 ENCOUNTER — Other Ambulatory Visit: Payer: Self-pay | Admitting: Cardiovascular Disease

## 2021-03-11 ENCOUNTER — Ambulatory Visit: Payer: Medicare Other

## 2021-03-12 ENCOUNTER — Ambulatory Visit
Admission: RE | Admit: 2021-03-12 | Discharge: 2021-03-12 | Disposition: A | Discharge status: Home / Self Care | Payer: Medicare Other | Source: Ambulatory Visit | Attending: Internal Medicine | Admitting: Internal Medicine

## 2021-03-12 DIAGNOSIS — Z1231 Encounter for screening mammogram for malignant neoplasm of breast: Secondary | ICD-10-CM

## 2021-04-29 ENCOUNTER — Other Ambulatory Visit: Payer: Self-pay | Admitting: Cardiovascular Disease

## 2021-04-30 ENCOUNTER — Ambulatory Visit: Payer: Medicare Other | Admitting: Cardiovascular Disease

## 2021-05-20 ENCOUNTER — Other Ambulatory Visit: Payer: Self-pay | Admitting: Cardiovascular Disease

## 2021-06-03 ENCOUNTER — Ambulatory Visit (INDEPENDENT_AMBULATORY_CARE_PROVIDER_SITE_OTHER): Payer: Medicare Other | Admitting: Pulmonary Disease

## 2021-06-03 ENCOUNTER — Encounter: Payer: Self-pay | Admitting: Pulmonary Disease

## 2021-06-03 VITALS — BP 122/66 | HR 72 | Temp 98.0°F

## 2021-06-03 DIAGNOSIS — J301 Allergic rhinitis due to pollen: Secondary | ICD-10-CM

## 2021-06-03 DIAGNOSIS — J4520 Mild intermittent asthma, uncomplicated: Secondary | ICD-10-CM

## 2021-06-03 NOTE — Progress Notes (Signed)
? ?Synopsis: Referred in May 2022 for cough by Shon Baton, MD ? ?Subjective:  ? ?PATIENT ID: Megan Nian GENDER: female DOB: 1955-11-24, MRN: [id number] ? ?Chief Complaint  ?Patient presents with  ? Follow-up  ?  Minor spring symptoms. Transient cough  ? ? ?PMH of arthritis, fatigue, HLD, HTN, insomnia, obesity. She saw Dr. Joya Gaskins several years ago ~8 years. Usually twice per has issues with coughing. Everytime she gets a URI  It goes to her chest. She feels like her symptoms are better now. She saw Dr. Joya Gaskins recently was given steroids and inhaler. Her symptoms are now better. Usually every year Q1 of the year she has bronchitis symptoms. She has allergy testing by Dr. Velora Heckler ~20 years ago. Takes no antihistamines today. She has had them before with loratidine and flonase. Has a poodle in the house. She is allergic to cats and she just cant be around them.  ? ?OV 06/03/2021:Here today for follow-up of mild intermittent asthma symptoms.  Last seen in the office for bronchitis in October 2022.  Saw Derl Barrow, NP was treated with steroids plus Z-Pak.  Continued on Symbicort.  Patient had follow-up video visit at the end of the month.  Felt like she was doing better.  Regional allergy panel positive IgE 44.  RAST panel positive for dust mites, cat dander, dog dander, cockroach.  From respiratory standpoint doing well.  Not using any inhalers at the moment.  She does have Symbicort use at home as needed. ? ? ?Past Medical History:  ?Diagnosis Date  ? Arthritis   ? Depression   ? denies   ? Fatigue   ? resolved   ? Hypercholesterolemia   ? Hyperlipidemia   ? controlled   ? Hypertension   ? controlled  ? Hypothyroidism   ? Insomnia   ? Obesity   ? Skin disorder   ? rosacea  ?  ? ?Family History  ?Problem Relation Age of Onset  ? Arrhythmia Mother   ? Emphysema Mother   ? Asthma Mother   ? Cancer Father   ? Cancer Brother   ?     leumkemia  ? Asthma Brother   ? Asthma Paternal Aunt   ?  ? ?Past Surgical History:   ?Procedure Laterality Date  ? APPENDECTOMY  1976  ? BREAST IMPLANT REMOVAL Bilateral   ? implanted 1996 removed 2002  ? CARPAL TUNNEL RELEASE    ? bilateral   ? ENDOMETRIAL FULGURATION  1992  ? foot surgery     ? tumor removed (left)   ? HYSTEROSCOPY WITH D & C N/A 06/15/2018  ? Procedure: DILATATION AND CURETTAGE /HYSTEROSCOPY;  Surgeon: Dian Queen, MD;  Location: [facility];  Service: Gynecology;  Laterality: N/A;  ? hysterscopy     ? OOPHORECTOMY  1976  ? left ovary removed   ? TOTAL SHOULDER ARTHROPLASTY Right 09/17/2018  ? Procedure: TOTAL SHOULDER ARTHROPLASTY anatomical;  Surgeon: Netta Cedars, MD;  Location: [facility];  Service: Orthopedics;  Laterality: Right;  ? ? ?Social History  ? ?Socioeconomic History  ? Marital status: Divorced  ?  Spouse name: Not on file  ? Number of children: Not on file  ? Years of education: Not on file  ? Highest education level: Not on file  ?Occupational History  ? Occupation: Armed forces technical officer  ?  Employer: SELF-EMPLOYED  ?Tobacco Use  ? Smoking status: Never  ? Smokeless tobacco: Never  ?Vaping Use  ? Vaping Use: Never used  ?  Substance and Sexual Activity  ? Alcohol use: Yes  ?  Comment: 2-3 glasses wine daily , 1 drink each weekend   ? Drug use: Never  ? Sexual activity: Not on file  ?Other Topics Concern  ? Not on file  ?Social History Narrative  ? Not on file  ? ?Social Determinants of Health  ? ?Financial Resource Strain: Not on file  ?Food Insecurity: Not on file  ?Transportation Needs: Not on file  ?Physical Activity: Not on file  ?Stress: Not on file  ?Social Connections: Not on file  ?Intimate Partner Violence: Not on file  ?  ? ?Allergies  ?Allergen Reactions  ? Cat Hair Extract Anaphylaxis and Shortness Of Breath  ?  ? ?Outpatient Medications Prior to Visit  ?Medication Sig Dispense Refill  ? oxybutynin (DITROPAN-XL) 10 MG 24 hr tablet Take 10 mg by mouth daily.    ? albuterol (VENTOLIN HFA) 108 (90 Base) MCG/ACT inhaler Inhale 2 puffs into the  lungs every 6 (six) hours as needed for wheezing or shortness of breath. 8 Savage 6  ? azelastine (ASTELIN) 0.1 % nasal spray Place 2 sprays into both nostrils 2 (two) times daily. Use in each nostril as directed 30 mL 1  ? azithromycin (ZITHROMAX) 250 MG tablet Zpack taper as directed (Patient not taking: Reported on 06/03/2021) 6 tablet 0  ? benzonatate (TESSALON) 100 MG capsule Take 1 capsule (100 mg total) by mouth 3 (three) times daily. For cough (Patient not taking: Reported on 06/03/2021) 90 capsule 4  ? budesonide-formoterol (SYMBICORT) 160-4.5 MCG/ACT inhaler Inhale 2 puffs into the lungs in the morning and at bedtime. 1 each 6  ? calcium citrate-vitamin D 200-200 MG-UNIT TABS Take 2 tablets by mouth daily.     ? cholecalciferol (VITAMIN D3) 25 MCG (1000 UT) tablet Take 1,000 Units by mouth daily.    ? Choline Fenofibrate (FENOFIBRIC ACID) 135 MG CPDR TAKE ONE CAPSULE EACH DAY 30 capsule 5  ? estradiol (VIVELLE-DOT) 0.025 MG/24HR Place 1 patch onto the skin 2 (two) times a week.      ? ezetimibe (ZETIA) 10 MG tablet TAKE ONE TABLET DAILY 90 tablet 3  ? levothyroxine (SYNTHROID) 88 MCG tablet TAKE ONE TABLET DAILY BEFORE BREAKFAST 90 tablet 0  ? loratadine (CLARITIN) 10 MG tablet Take 1 tablet (10 mg total) by mouth daily. 90 tablet 3  ? metroNIDAZOLE (METROCREAM) 0.75 % cream Apply 1 application topically 2 (two) times daily.  (Patient not taking: Reported on 06/03/2021)    ? multivitamin (THERAGRAN) per tablet Take 1 tablet by mouth daily.      ? Naproxen Sodium (ALEVE PO) Aleve    ? naproxen sodium (ALEVE) 220 MG tablet Aleve 220 mg tablet    ? olmesartan (BENICAR) 20 MG tablet TAKE ONE TABLET BY MOUTH ONCE DAILY 90 tablet 0  ? progesterone (PROMETRIUM) 100 MG capsule Take 100 mg by mouth See admin instructions. Days 1-10 of every month    ? spironolactone (ALDACTONE) 25 MG tablet TAKE ONE-HALF TABLET DAILY 45 tablet 1  ? TURMERIC CURCUMIN PO Take 500 mg by mouth 2 (two) times daily.     ? VASCEPA 1 Savage capsule TAKE  TWO CAPSULES TWICE DAILY 180 capsule 1  ? vitamin C (ASCORBIC ACID) 500 MG tablet Take 500 mg by mouth daily.    ? Vitamin D, Ergocalciferol, (DRISDOL) 50000 units CAPS capsule Take 50,000 Units by mouth every 30 (thirty) days.     ? Zinc 10 MG LOZG zinc    ?  zinc gluconate 50 MG tablet Take 50 mg by mouth daily.    ? predniSONE (DELTASONE) 20 MG tablet Take 2 tablets (40 mg total) by mouth daily with breakfast. (Patient not taking: Reported on 06/03/2021) 10 tablet 0  ? ?No facility-administered medications prior to visit.  ? ? ?Review of Systems  ?Constitutional:  Negative for chills, fever, malaise/fatigue and weight loss.  ?HENT:  Negative for hearing loss, sore throat and tinnitus.   ?Eyes:  Negative for blurred vision and double vision.  ?Respiratory:  Negative for cough, hemoptysis, sputum production, shortness of breath, wheezing and stridor.   ?Cardiovascular:  Negative for chest pain, palpitations, orthopnea, leg swelling and PND.  ?Gastrointestinal:  Negative for abdominal pain, constipation, diarrhea, heartburn, nausea and vomiting.  ?Genitourinary:  Negative for dysuria, hematuria and urgency.  ?Musculoskeletal:  Negative for joint pain and myalgias.  ?Skin:  Negative for itching and rash.  ?Neurological:  Negative for dizziness, tingling, weakness and headaches.  ?Endo/Heme/Allergies:  Negative for environmental allergies. Does not bruise/bleed easily.  ?Psychiatric/Behavioral:  Negative for depression. The patient is not nervous/anxious and does not have insomnia.   ?All other systems reviewed and are negative. ? ? ?Objective:  ?Physical Exam ?Vitals reviewed.  ?Constitutional:   ?   General: She is not in acute distress. ?   Appearance: She is well-developed.  ?HENT:  ?   Head: Normocephalic and atraumatic.  ?Eyes:  ?   General: No scleral icterus. ?   Conjunctiva/sclera: Conjunctivae normal.  ?   Pupils: Pupils are equal, round, and reactive to light.  ?Neck:  ?   Vascular: No JVD.  ?   Trachea: No  tracheal deviation.  ?Cardiovascular:  ?   Rate and Rhythm: Normal rate and regular rhythm.  ?   Heart sounds: Normal heart sounds. No murmur heard. ?Pulmonary:  ?   Effort: Pulmonary effort is normal. No tach

## 2021-06-06 ENCOUNTER — Other Ambulatory Visit: Payer: Self-pay | Admitting: Cardiovascular Disease

## 2021-07-08 ENCOUNTER — Telehealth: Payer: Self-pay

## 2021-07-08 ENCOUNTER — Encounter: Payer: Self-pay | Admitting: Cardiovascular Disease

## 2021-07-08 NOTE — Telephone Encounter (Signed)
   Pre-operative Risk Assessment    Patient Name: Megan Savage  DOB: 1955-03-15 MRN: [id number]      Request for Surgical Clearance    Procedure:   Total Right hip arthroplasty  Date of Surgery:  Clearance 08/27/21                                 Surgeon:  Paralee Cancel, MD Surgeon's Group or Practice Name:  [facility] Phone number:  [phone] Fax number:  [phone]   Type of Clearance Requested:   - Medical    Type of Anesthesia:  Spinal   Additional requests/questions:    SignedWonda Horner   07/08/2021, 12:38 PM

## 2021-07-09 ENCOUNTER — Other Ambulatory Visit: Payer: Self-pay | Admitting: Cardiovascular Disease

## 2021-07-09 NOTE — Telephone Encounter (Signed)
Will route to requesting surgeon's office to make them aware. 

## 2021-07-09 NOTE — Telephone Encounter (Signed)
Primary Cardiologist:Thomas Claiborne Billings, MD  Chart reviewed as part of pre-operative protocol coverage. Because of Megan Savage's past medical history and time since last visit, he/she will require a follow-up visit in order to better assess preoperative cardiovascular risk.  Pre-op covering staff: - Please schedule appointment and call patient to inform them. (Patient has requested in-person visit) - Please contact requesting surgeon's office via preferred method (i.e, phone, fax) to inform them of need for appointment prior to surgery.  If applicable, this message will also be routed to pharmacy pool and/or primary cardiologist for input on holding anticoagulant/antiplatelet agent as requested below so that this information is available at time of patient's appointment.   Emmaline Life, NP-C    07/09/2021, 9:26 AM [place] [address], [address] Office [phone] Fax [phone]

## 2021-07-09 NOTE — Telephone Encounter (Signed)
Spoke with patient and scheduled her for a pre-op clearance appointment on 08/05/21 at 11:20 AM with Fabian Sharp, [place].

## 2021-07-23 ENCOUNTER — Ambulatory Visit: Payer: Medicare Other | Admitting: Cardiovascular Disease

## 2021-08-02 ENCOUNTER — Encounter (HOSPITAL_COMMUNITY): Payer: Self-pay

## 2021-08-02 NOTE — Patient Instructions (Addendum)
DUE TO COVID-19 ONLY TWO VISITORS  (aged 67 and older)  IS ALLOWED TO COME WITH YOU AND STAY IN THE WAITING ROOM ONLY DURING PRE OP AND PROCEDURE.   **NO VISITORS ARE ALLOWED IN THE SHORT STAY AREA OR RECOVERY ROOM!!**  IF YOU WILL BE ADMITTED INTO THE HOSPITAL YOU ARE ALLOWED ONLY FOUR SUPPORT PEOPLE DURING VISITATION HOURS ONLY (7 AM -8PM)   The support person(s) must pass our screening, gel in and out Visitors GUEST BADGE MUST BE WORN VISIBLY  One adult visitor may remain with you overnight and MUST be in the room by 8 P.M.   You are not required to [employer] often Do NOT share personal items Notify your provider if you are in close contact with someone who has COVID or you develop fever 100.4 or greater, new onset of sneezing, cough, sore throat, shortness of breath or body aches.        Your procedure is scheduled on:  08-27-21   Report to [facility] Main Entrance    Report to admitting at 10:30 AM   Call this number if you have problems the morning of surgery [phone]   Do not eat food :After Midnight the night before surgery   After Midnight you may have the following liquids until 9:50 AM DAY OF SURGERY  Clear Liquid Diet Water Black Coffee (sugar ok, NO MILK/CREAM OR CREAMERS)  Tea (sugar ok, NO MILK/CREAM OR CREAMERS) regular and decaf                             Plain Jell-O (NO RED)                                           Fruit ices (not with fruit pulp, NO RED)                                     Popsicles (NO RED)                                                                  Juice: apple, WHITE grape, WHITE cranberry Sports drinks like Gatorade (NO RED)                   The day of surgery:  Drink ONE (1) Pre-Surgery Clear Ensure at 9:50 AM the morning of surgery. Drink in one sitting. Do not sip.  This drink was given to you during your hospital  pre-op appointment visit. Nothing else to drink after completing the Pre-Surgery  Clear Ensure          If you have questions, please contact your surgeon's office.   FOLLOW ANY ADDITIONAL PRE OP INSTRUCTIONS YOU RECEIVED FROM YOUR SURGEON'S OFFICE!!!     Oral Hygiene is also important to reduce your risk of infection.  Remember - BRUSH YOUR TEETH THE MORNING OF SURGERY WITH YOUR REGULAR TOOTHPASTE   Do NOT smoke after Midnight   Take these medicines the morning of surgery with A SIP OF WATER: Ezetimibe, Levothyroxine, Claritin, Oxybutynin.  Okay to use inhalers   DO NOT [place]. PHARMACY WILL DISPENSE MEDICATIONS LISTED ON YOUR MEDICATION LIST TO YOU DURING YOUR ADMISSION [name]                          You may not have any metal on your body including hair pins, jewelry, and body piercing             Do not wear make-up, lotions, powders, perfumes, or deodorant  Do not wear nail polish including gel and S&S, artificial/acrylic nails, or any other type of covering on natural nails including finger and toenails. If you have artificial nails, gel coating, etc. that needs to be removed by a nail salon please have this removed prior to surgery or surgery may need to be canceled/ delayed if the surgeon/ anesthesia feels like they are unable to be safely monitored.   Do not shave  48 hours prior to surgery.               Contacts, dentures or bridgework may not be worn into surgery.   Bring small overnight bag day of surgery.  Do not bring valuables to the hospital. [name]   Special Instructions: Bring a copy of your healthcare power of attorney and living will documents the day of surgery if you haven't scanned them before.  Please read over the following fact sheets you were given: IF YOU HAVE QUESTIONS ABOUT YOUR PRE-OP INSTRUCTIONS PLEASE CALL [facility] - Preparing for Surgery Before surgery, you can play an important  role.  Because skin is not sterile, your skin needs to be as free of germs as possible.  You can reduce the number of germs on your skin by washing with CHG (chlorahexidine gluconate) soap before surgery.  CHG is an antiseptic cleaner which kills germs and bonds with the skin to continue killing germs even after washing. Please DO NOT use if you have an allergy to CHG or antibacterial soaps.  If your skin becomes reddened/irritated stop using the CHG and inform your nurse when you arrive at Short Stay. Do not shave (including legs and underarms) for at least 48 hours prior to the first CHG shower.  You may shave your face/neck.  Please follow these instructions carefully:  1.  Shower with CHG Soap the night before surgery and the  morning of surgery.  2.  If you choose to wash your hair, wash your hair first as usual with your normal  shampoo.  3.  After you shampoo, rinse your hair and body thoroughly to remove the shampoo.                             4.  Use CHG as you would any other liquid soap.  You can apply chg directly to the skin and wash.  Gently with a scrungie or clean washcloth.  5.  Apply the CHG Soap to your body ONLY FROM THE NECK DOWN.   Do   not use on face/ open  Wound or open sores. Avoid contact with eyes, ears mouth and   genitals (private parts).                       Wash face,  Genitals (private parts) with your normal soap.             6.  Wash thoroughly, paying special attention to the area where your    surgery  will be performed.  7.  Thoroughly rinse your body with warm water from the neck down.  8.  DO NOT shower/wash with your normal soap after using and rinsing off the CHG Soap.                9.  Pat yourself dry with a clean towel.            10.  Wear clean pajamas.            11.  Place clean sheets on your bed the night of your first shower and do not  sleep with pets. Day of Surgery : Do not apply any lotions/deodorants the morning  of surgery.  Please wear clean clothes to the hospital/surgery center.  FAILURE TO FOLLOW THESE INSTRUCTIONS MAY RESULT IN THE CANCELLATION OF YOUR SURGERY  PATIENT SIGNATURE_________________________________  NURSE SIGNATURE__________________________________  ________________________________________________________________________     Megan Savage  An incentive spirometer is a tool that can help keep your lungs clear and active. This tool measures how well you are filling your lungs with each breath. Taking long deep breaths may help reverse or decrease the chance of developing breathing (pulmonary) problems (especially infection) following: A long period of time when you are unable to move or be active. BEFORE THE PROCEDURE  If the spirometer includes an indicator to show your best effort, your nurse or respiratory therapist will set it to a desired goal. If possible, sit up straight or lean slightly forward. Try not to slouch. Hold the incentive spirometer in an upright position. INSTRUCTIONS FOR USE  Sit on the edge of your bed if possible, or sit up as far as you can in bed or on a chair. Hold the incentive spirometer in an upright position. Breathe out normally. Place the mouthpiece in your mouth and seal your lips tightly around it. Breathe in slowly and as deeply as possible, raising the piston or the ball toward the top of the column. Hold your breath for 3-5 seconds or for as long as possible. Allow the piston or ball to fall to the bottom of the column. Remove the mouthpiece from your mouth and breathe out normally. Rest for a few seconds and repeat Steps 1 through 7 at least 10 times every 1-2 hours when you are awake. Take your time and take a few normal breaths between deep breaths. The spirometer may include an indicator to show your best effort. Use the indicator as a goal to work toward during each repetition. After each set of 10 deep breaths, practice coughing  to be sure your lungs are clear. If you have an incision (the cut made at the time of surgery), support your incision when coughing by placing a pillow or rolled up towels firmly against it. Once you are able to get out of bed, walk around indoors and cough well. You may stop using the incentive spirometer when instructed by your caregiver.  RISKS AND COMPLICATIONS Take your time so you do not get dizzy or light-headed. If you are in  pain, you may need to take or ask for pain medication before doing incentive spirometry. It is harder to take a deep breath if you are having pain. AFTER USE Rest and breathe slowly and easily. It can be helpful to keep track of a log of your progress. Your caregiver can provide you with a simple table to help with this. If you are using the spirometer at home, follow these instructions: [place] IF:  You are having difficultly using the spirometer. You have trouble using the spirometer as often as instructed. Your pain medication is not giving enough relief while using the spirometer. You develop fever of 100.5 F (38.1 C) or higher. SEEK IMMEDIATE MEDICAL CARE IF:  You cough up bloody sputum that had not been present before. You develop fever of 102 F (38.9 C) or greater. You develop worsening pain at or near the incision site. MAKE SURE YOU:  Understand these instructions. Will watch your condition. Will get help right away if you are not doing well or get worse. Document Released: 05/26/2006 Document Revised: 04/07/2011 Document Reviewed: 07/27/2006 ExitCare Patient Information 2014 ExitCare, [place].   ________________________________________________________________________  WHAT IS A BLOOD TRANSFUSION? Blood Transfusion Information  A transfusion is the replacement of blood or some of its parts. Blood is made up of multiple cells which provide different functions. Red blood cells carry oxygen and are used for blood loss replacement. White  blood cells fight against infection. Platelets control bleeding. Plasma helps clot blood. Other blood products are available for specialized needs, such as hemophilia or other clotting disorders. BEFORE THE TRANSFUSION  Who gives blood for transfusions?  Healthy volunteers who are fully evaluated to make sure their blood is safe. This is blood bank blood. Transfusion therapy is the safest it has ever been in the practice of medicine. Before blood is taken from a donor, a complete history is taken to make sure that person has no history of diseases nor engages in risky social behavior (examples are intravenous drug use or sexual activity with multiple partners). The donor's travel history is screened to minimize risk of transmitting infections, such as malaria. The donated blood is tested for signs of infectious diseases, such as HIV and hepatitis. The blood is then tested to be sure it is compatible with you in order to minimize the chance of a transfusion reaction. If you or a relative donates blood, this is often done in anticipation of surgery and is not appropriate for emergency situations. It takes many days to process the donated blood. RISKS AND COMPLICATIONS Although transfusion therapy is very safe and saves many lives, the main dangers of transfusion include:  Getting an infectious disease. Developing a transfusion reaction. This is an allergic reaction to something in the blood you were given. Every precaution is taken to prevent this. The decision to have a blood transfusion has been considered carefully by your caregiver before blood is given. Blood is not given unless the benefits outweigh the risks. AFTER THE TRANSFUSION Right after receiving a blood transfusion, you will usually feel much better and more energetic. This is especially true if your red blood cells have gotten low (anemic). The transfusion raises the level of the red blood cells which carry oxygen, and this usually causes  an energy increase. The nurse administering the transfusion will monitor you carefully for complications. HOME CARE INSTRUCTIONS  No special instructions are needed after a transfusion. You may find your energy is better. Speak with your caregiver about any  limitations on activity for underlying diseases you may have. SEEK MEDICAL CARE IF:  Your condition is not improving after your transfusion. You develop redness or irritation at the intravenous (IV) site. SEEK IMMEDIATE MEDICAL CARE IF:  Any of the following symptoms occur over the next 12 hours: Shaking chills. You have a temperature by mouth above 102 F (38.9 C), not controlled by medicine. Chest, back, or muscle pain. People around you feel you are not acting correctly or are confused. Shortness of breath or difficulty breathing. Dizziness and fainting. You get a rash or develop hives. You have a decrease in urine output. Your urine turns a dark color or changes to pink, red, or brown. Any of the following symptoms occur over the next 10 days: You have a temperature by mouth above 102 F (38.9 C), not controlled by medicine. Shortness of breath. Weakness after normal activity. The white part of the eye turns yellow (jaundice). You have a decrease in the amount of urine or are urinating less often. Your urine turns a dark color or changes to pink, red, or brown. Document Released: 01/11/2000 Document Revised: 04/07/2011 Document Reviewed: 08/30/2007 [facility] Patient Information 2014 [place], [place].  _______________________________________________________________________

## 2021-08-02 NOTE — Progress Notes (Addendum)
COVID Vaccine Completed: Yes  Date of COVID positive in last 90 days:  PCP - Shon Baton, MD Cardiologist - Shelva Majestic, MD Pulmonologist - June Leap, DO (recurrent bronchitis)  Cardiac clearance in Epic dated 08-05-21 by Fabian Sharp, PA  Chest x-ray -  EKG -  Stress Test -  ECHO - greater than 2 years Epic, scheduled for 10-14-21 Cardiac Cath -  Pacemaker/ICD device last checked: Spinal Cord Stimulator: Cardiac CT - 2019 Epic  Bowel Prep -   Sleep Study -  CPAP -   Fasting Blood Sugar -  Checks Blood Sugar _____ times a day  Blood Thinner Instructions: Aspirin Instructions: Last Dose:  Activity level:  Can go up a flight of stairs and perform activities of daily living without stopping and without symptoms of chest pain or shortness of breath.  Able to exercise without symptoms  Unable to go up a flight of stairs without symptoms of     Anesthesia review:  Systolic murmur and HTN followed by cardiology.  Patient denies shortness of breath, fever, cough and chest pain at PAT appointment  Patient verbalized understanding of instructions that were given to them at the PAT appointment. Patient was also instructed that they will need to review over the PAT instructions again at home before surgery.

## 2021-08-04 NOTE — Progress Notes (Unsigned)
Cardiology Office Note:    Date:  08/04/2021   ID:  Megan Savage, DOB 01-Jul-1955, MRN [id number]  PCP:  Shon Baton, [place] Providers Cardiologist:  Shelva Majestic, MD { Click to update primary MD,subspecialty MD or APP then REFRESH:1}    Referring MD: Shon Baton, MD   No chief complaint on file. ***  History of Present Illness:    Megan Savage is a 66 y.o. female who self-referred to Dr. Claiborne Billings for difficult to control hypertension and cardiovascular risk assessment.  Coronary calcium score in June 2019 was 0.  Given history of difficult to control hypertension she underwent renal artery ultrasound which showed less than 59% bilateral narrowing of the renal arteries.  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  She has denied abdominal symptoms.  She is maintained on olmesartan 40 mg and spironolactone 12.5 mg.  Given her LDL of 118, rosuvastatin 10 mg was started.  She did not tolerate Crestor and was started on Zetia 10 mg.  She reduced alcohol drinking and lost weight with improvement of her LDL to 59.  Triglycerides were also improved.  She was last seen by Dr. Claiborne Billings 04/2020.  She presents today for preoperative risk evaluation for Mace prior to hip replacement.    Hypertension Olmesartan, spironolactone Need BMP   Hyperlipidemia Zetia 10 mg and vascepa, intolerant to 10 mg Crestor Need updated lipid panel   Preoperative risk evaluation for Mace prior to hip surgery No cardiac complications following shoulder replacement in 2020 Coronary calcium score 0 in 2019 She can complete more than 4 METS     Past Medical History:  Diagnosis Date   Arthritis    Bronchitis    Depression    denies    Fatigue    resolved    Hypercholesterolemia    Hyperlipidemia    controlled    Hypertension    controlled   Hypothyroidism    Insomnia    Obesity    Skin disorder    rosacea    Past Surgical History:   Procedure Laterality Date   APPENDECTOMY  1976   BREAST IMPLANT REMOVAL Bilateral    implanted 1996 removed 2002   CARPAL TUNNEL RELEASE     bilateral    ENDOMETRIAL FULGURATION  1992   foot surgery      tumor removed (left)    HYSTEROSCOPY WITH D & C N/A 06/15/2018   Procedure: DILATATION AND CURETTAGE /HYSTEROSCOPY;  Surgeon: Dian Queen, MD;  Location: [facility];  Service: Gynecology;  Laterality: N/A;   hysterscopy      OOPHORECTOMY  1976   left ovary removed    TOTAL SHOULDER ARTHROPLASTY Right 09/17/2018   Procedure: TOTAL SHOULDER ARTHROPLASTY anatomical;  Surgeon: Netta Cedars, MD;  Location: [facility];  Service: Orthopedics;  Laterality: Right;    Current Medications: No outpatient medications have been marked as taking for the 08/05/21 encounter (Appointment) with Ledora Bottcher, [place].     Allergies:   Cat hair extract   Social History   Socioeconomic History   Marital status: Divorced    Spouse name: Not on file   Number of children: Not on file   Years of education: Not on file   Highest education level: Not on file  Occupational History   Occupation: Astronomer: SELF-EMPLOYED  Tobacco Use   Smoking status: Never   Smokeless tobacco: Never  Vaping Use   Vaping Use: Never used  Substance and Sexual Activity   Alcohol use: Yes    Comment: 2-3 glasses wine daily , 1 drink each weekend    Drug use: Never   Sexual activity: Not on file  Other Topics Concern   Not on file  Social History Narrative   Not on file   Social Determinants of Health   Financial Resource Strain: Not on file  Food Insecurity: Not on file  Transportation Needs: Not on file  Physical Activity: Not on file  Stress: Not on file  Social Connections: Not on file     Family History: The patient's ***family history includes Arrhythmia in her mother; Asthma in her brother, mother, and paternal aunt; Cancer in her brother and father; Emphysema  in her mother.  ROS:   Please see the history of present illness.    *** All other systems reviewed and are negative.  EKGs/Labs/Other Studies Reviewed:    The following studies were reviewed today:  Coronary calcium score 2019: IMPRESSION: 1. No coronary artery calcification.   2. Total Agatston Score: 0   3. MESA age and sex matched database percentile: 0th   4. RIGHT lower lobe noncalcified pulmonary nodule. Non-contrast chest CT at 6-12 months is recommended. If the nodule is stable at time of repeat CT, then future CT at 18-24 months (from today's scan) is considered optional for low-risk patients, but is recommended for high-risk patients. This recommendation follows the consensus statement: Guidelines for Management of Incidental Pulmonary Nodules Detected on CT Images: From the Fleischner Society 2017; Radiology 2017; 284:228-243.   Echo 09/03/2017: Study Conclusions   - Left ventricle: The cavity size was normal. There was mild    concentric hypertrophy. Systolic function was normal. The    estimated ejection fraction was in the range of 60% to 65%. Wall    motion was normal; there were no regional wall motion    abnormalities. Doppler parameters are consistent with abnormal    left ventricular relaxation (grade 1 diastolic dysfunction).    Doppler parameters are consistent with high ventricular filling    pressure.  - Aortic valve: Transvalvular velocity was within the normal range.    There was no stenosis. There was mild regurgitation.  - Mitral valve: Transvalvular velocity was within the normal range.    There was no evidence for stenosis. There was no regurgitation.  - Left atrium: The atrium was mildly dilated.  - Right ventricle: The cavity size was normal. Wall thickness was    normal. Systolic function was normal.  - Atrial septum: No defect or patent foramen ovale was identified    by color flow Doppler.  - Tricuspid valve: There was no regurgitation.    EKG:  EKG is *** ordered today.  The ekg ordered today demonstrates ***  Recent Labs: No results found for requested labs within last 365 days.  Recent Lipid Panel    Component Value Date/Time   CHOL 162 05/16/2020 0722   TRIG 242 (H) 05/16/2020 0722   HDL 41 05/16/2020 0722   CHOLHDL 4.0 05/16/2020 0722   CHOLHDL 4 06/21/2010 0811   VLDL 36.4 06/21/2010 0811   LDLCALC 81 05/16/2020 0722     Risk Assessment/Calculations:   {Does this patient have ATRIAL FIBRILLATION?:[phone]}       Physical Exam:    VS:  There were no vitals taken for this visit.    Wt Readings from Last 3 Encounters:  11/16/20 168 lb (  76.2 kg)  11/09/20 175 lb 12.8 oz (79.7 kg)  05/30/20 174 lb (78.9 kg)     GEN: *** Well nourished, well developed in no acute distress HEENT: Normal NECK: No JVD; No carotid bruits LYMPHATICS: No lymphadenopathy CARDIAC: ***RRR, no murmurs, rubs, gallops RESPIRATORY:  Clear to auscultation without rales, wheezing or rhonchi  ABDOMEN: Soft, non-tender, non-distended MUSCULOSKELETAL:  No edema; No deformity  SKIN: Warm and dry NEUROLOGIC:  Alert and oriented x 3 PSYCHIATRIC:  Normal affect   ASSESSMENT:    No diagnosis found. PLAN:    In order of problems listed above:  ***      {Are you ordering a CV Procedure (e.g. stress test, cath, DCCV, TEE, etc)?   Press F2        :[id number]}    Medication Adjustments/Labs and Tests Ordered: Current medicines are reviewed at length with the patient today.  Concerns regarding medicines are outlined above.  No orders of the defined types were placed in this encounter.  No orders of the defined types were placed in this encounter.   There are no Patient Instructions on file for this visit.   Signed, Ledora Bottcher, [place]  08/04/2021 8:17 PM    [place]

## 2021-08-05 ENCOUNTER — Ambulatory Visit (INDEPENDENT_AMBULATORY_CARE_PROVIDER_SITE_OTHER): Payer: Medicare Other | Admitting: Physician Assistant

## 2021-08-05 ENCOUNTER — Encounter: Payer: Self-pay | Admitting: Physician Assistant

## 2021-08-05 VITALS — BP 142/68 | HR 74 | Ht 64.0 in | Wt 171.4 lb

## 2021-08-05 DIAGNOSIS — E782 Mixed hyperlipidemia: Secondary | ICD-10-CM

## 2021-08-05 DIAGNOSIS — R011 Cardiac murmur, unspecified: Secondary | ICD-10-CM | POA: Diagnosis not present

## 2021-08-05 DIAGNOSIS — I10 Essential (primary) hypertension: Secondary | ICD-10-CM

## 2021-08-05 DIAGNOSIS — Z01818 Encounter for other preprocedural examination: Secondary | ICD-10-CM | POA: Diagnosis not present

## 2021-08-05 DIAGNOSIS — Z01810 Encounter for preprocedural cardiovascular examination: Secondary | ICD-10-CM

## 2021-08-05 NOTE — Patient Instructions (Signed)
Medication Instructions:  No Changes *If you need a refill on your cardiac medications before your next appointment, please call your pharmacy*   Lab Work: No Labs If you have labs (blood work) drawn today and your tests are completely normal, you will receive your results only by: [place] (if you have MyChart) OR A paper copy in the mail If you have any lab test that is abnormal or we need to change your treatment, we will call you to review the results.   Testing/Procedures: [address], [address]. Your physician has requested that you have an echocardiogram. Echocardiography is a painless test that uses sound waves to create images of your heart. It provides your doctor with information about the size and shape of your heart and how well your heart's chambers and valves are working. This procedure takes approximately one hour. There are no restrictions for this procedure.    Follow-Up: At [facility], you and your health needs are our priority.  As part of our continuing mission to provide you with exceptional heart care, we have created designated Provider Care Teams.  These Care Teams include your primary Cardiologist (physician) and Advanced Practice Providers (APPs -  Physician Assistants and Nurse Practitioners) who all work together to provide you with the care you need, when you need it.  We recommend signing up for the patient portal called "MyChart".  Sign up information is provided on this After Visit Summary.  MyChart is used to connect with patients for Virtual Visits (Telemedicine).  Patients are able to view lab/test results, encounter notes, upcoming appointments, etc.  Non-urgent messages can be sent to your provider as well.   To learn more about what you can do with MyChart, go to [email or web address].    Your next appointment:   Keep Follow up As Scheduled  The format for your next appointment:   In Person  Provider:   Shelva Majestic, MD        Important Information About Sugar

## 2021-08-12 ENCOUNTER — Encounter (HOSPITAL_COMMUNITY): Payer: Self-pay

## 2021-08-12 ENCOUNTER — Other Ambulatory Visit: Payer: Self-pay | Admitting: Cardiovascular Disease

## 2021-08-14 ENCOUNTER — Encounter (HOSPITAL_COMMUNITY): Payer: Self-pay

## 2021-08-14 ENCOUNTER — Encounter (HOSPITAL_COMMUNITY)
Admission: RE | Admit: 2021-08-14 | Discharge: 2021-08-14 | Disposition: A | Discharge status: Home / Self Care | Payer: Medicare Other | Source: Ambulatory Visit | Attending: Orthopedic Surgery | Admitting: Orthopedic Surgery

## 2021-08-14 ENCOUNTER — Other Ambulatory Visit: Payer: Self-pay

## 2021-08-14 VITALS — BP 145/70 | HR 80 | Temp 98.8°F | Resp 14 | Ht 64.5 in | Wt 169.2 lb

## 2021-08-14 DIAGNOSIS — I2510 Atherosclerotic heart disease of native coronary artery without angina pectoris: Secondary | ICD-10-CM | POA: Insufficient documentation

## 2021-08-14 DIAGNOSIS — Z01818 Encounter for other preprocedural examination: Secondary | ICD-10-CM

## 2021-08-14 DIAGNOSIS — M1611 Unilateral primary osteoarthritis, right hip: Secondary | ICD-10-CM | POA: Diagnosis not present

## 2021-08-14 DIAGNOSIS — Z01812 Encounter for preprocedural laboratory examination: Secondary | ICD-10-CM | POA: Diagnosis present

## 2021-08-14 HISTORY — DX: Bronchitis, not specified as acute or chronic: J40

## 2021-08-14 HISTORY — DX: Anemia, unspecified: D64.9

## 2021-08-14 HISTORY — DX: Cardiac murmur, unspecified: R01.1

## 2021-08-14 LAB — CBC
HCT: 38.8 % (ref 36.0–46.0)
Hemoglobin: 13 g/dL (ref 12.0–15.0)
MCH: 29.9 pg (ref 26.0–34.0)
MCHC: 33.5 g/dL (ref 30.0–36.0)
MCV: 89.2 fL (ref 80.0–100.0)
Platelets: 299 10*3/uL (ref 150–400)
RBC: 4.35 MIL/uL (ref 3.87–5.11)
RDW: 11.7 % (ref 11.5–15.5)
WBC: 4.6 10*3/uL (ref 4.0–10.5)
nRBC: 0 % (ref 0.0–0.2)

## 2021-08-14 LAB — TYPE AND SCREEN
ABO/RH(D): O POS
Antibody Screen: NEGATIVE

## 2021-08-14 LAB — SURGICAL PCR SCREEN
MRSA, PCR: NEGATIVE
Staphylococcus aureus: NEGATIVE

## 2021-08-14 LAB — BASIC METABOLIC PANEL
Anion gap: 8 (ref 5–15)
BUN: 20 mg/dL (ref 8–23)
CO2: 24 mmol/L (ref 22–32)
Calcium: 9.6 mg/dL (ref 8.9–10.3)
Chloride: 101 mmol/L (ref 98–111)
Creatinine, Ser: 0.46 mg/dL (ref 0.44–1.00)
GFR, Estimated: >60 mL/min (ref >60)
Glucose, Bld: 136 mg/dL — ABNORMAL HIGH (ref 70–99)
Potassium: 4 mmol/L (ref 3.5–5.1)
Sodium: 133 mmol/L — ABNORMAL LOW (ref 135–145)

## 2021-08-19 ENCOUNTER — Other Ambulatory Visit: Payer: Self-pay | Admitting: Cardiovascular Disease

## 2021-08-23 ENCOUNTER — Ambulatory Visit (HOSPITAL_BASED_OUTPATIENT_CLINIC_OR_DEPARTMENT_OTHER): Payer: Medicare Other | Attending: Orthopedic Surgery | Admitting: Physical Therapy

## 2021-08-23 ENCOUNTER — Other Ambulatory Visit: Payer: Self-pay

## 2021-08-23 DIAGNOSIS — M5412 Radiculopathy, cervical region: Secondary | ICD-10-CM | POA: Insufficient documentation

## 2021-08-23 DIAGNOSIS — M62838 Other muscle spasm: Secondary | ICD-10-CM

## 2021-08-23 DIAGNOSIS — M5030 Other cervical disc degeneration, unspecified cervical region: Secondary | ICD-10-CM | POA: Insufficient documentation

## 2021-08-23 NOTE — Therapy (Addendum)
OUTPATIENT PHYSICAL THERAPY CERVICAL EVALUATION/discharge    Patient Name: Megan Savage MRN: [id number] DOB:October 21, 1955, 66 y.o., female Today's Date: 08/23/2021   PT End of Session - 08/23/21 1135     Visit Number 1    Number of Visits 6    Date for PT Re-Evaluation 10/04/21    PT Start Time 0845    PT Stop Time 0927    PT Time Calculation (min) 42 min    Activity Tolerance Patient tolerated treatment well    Behavior During Therapy [facility] for tasks assessed/performed             Past Medical History:  Diagnosis Date   Anemia    Arthritis    Bronchitis    Depression    denies, borther passed away   Fatigue    resolved    Heart murmur    Hypercholesterolemia    Hyperlipidemia    controlled    Hypertension    controlled   Hypothyroidism    Insomnia    Obesity    Skin disorder    rosacea   Past Surgical History:  Procedure Laterality Date   APPENDECTOMY  1976   BREAST IMPLANT REMOVAL Bilateral    implanted 1996 removed 2002   CARPAL TUNNEL RELEASE     bilateral    ENDOMETRIAL FULGURATION  1992   foot surgery      tumor removed (left)    HYSTEROSCOPY WITH D & C N/A 06/15/2018   Procedure: DILATATION AND CURETTAGE /HYSTEROSCOPY;  Surgeon: Dian Queen, MD;  Location: [place];  Service: Gynecology;  Laterality: N/A;   hysterscopy      OOPHORECTOMY  1976   left ovary removed    TOTAL SHOULDER ARTHROPLASTY Right 09/17/2018   Procedure: TOTAL SHOULDER ARTHROPLASTY anatomical;  Surgeon: Netta Cedars, MD;  Location: [facility];  Service: Orthopedics;  Laterality: Right;   Patient Active Problem List   Diagnosis Date Noted   Mild intermittent acute asthmatic bronchitis 11/09/2020   Abnormal cervical Papanicolaou smear 11/07/2020   Arthritis 11/07/2020   Disorder of skin 11/07/2020   Seasonal allergic rhinitis due to pollen 05/03/2020   Hypothyroidism (acquired) 12/06/2018   S/P shoulder replacement, right 09/17/2018   Trochanteric bursitis of  right hip 05/24/2018   Pain in joint of right shoulder 05/17/2018   Osteoarthritis of left knee 03/07/2018   Localized, primary osteoarthritis of shoulder region 03/07/2018   Pain in left knee 01/21/2018   Pulmonary nodule, right 07/21/2017   Left wrist pain 05/28/2017   Pain in joint of right hip 03/31/2017   Chronic cough 10/03/2010   Hypertension 06/28/2010   Hyperlipemia 06/28/2010    PCP: Dr Shon Baton MD   REFERRING PROVIDER: Dr Netta Cedars   REFERRING DIAG: Cervicalgia   THERAPY DIAG:  Other muscle spasm  Radiculopathy, cervical region  Rationale for Evaluation and Treatment Rehabilitation  ONSET DATE: April was the initial onset   SUBJECTIVE:  SUBJECTIVE STATEMENT:   PERTINENT HISTORY:  Right Total Shoulder; Patient will have an anterior hip replacement on Tuesday   PAIN:  Are you having pain? Yes: NPRS scale: 0/10 Pain location: right upper trap  Pain description: just fatigue  Aggravating factors: just comes every month or so  Relieving factors: rest   PRECAUTIONS: None  WEIGHT BEARING RESTRICTIONS No  FALLS:  Has patient fallen in last 6 months? Yes had 1 slip on water in the spring and fell on the right arm   LIVING ENVIRONMENT: Bedroom is on the first floor; 3 steps into the house     OCCUPATION:  Interior and spatial designer    PLOF: Independent   Hobbies: golf    PATIENT GOALS   To be ale to control her neck symptoms    OBJECTIVE:   DIAGNOSTIC FINDINGS:  X-ray: bone spurring and arthritis   PATIENT SURVEYS:  FOTO     COGNITION: Overall cognitive status: Within functional limits for tasks assessed   SENSATION: Weak feeling   Right handed   POSTURE: No Significant postural limitations  PALPATION: Tightness in bilateral upper  traps but right more then left    CERVICAL ROM:   Active ROM A/PROM (deg) eval  Flexion 50  Extension 40  Right lateral flexion   Left lateral flexion   Right rotation 65  Left rotation 65   (Blank rows = not tested)  UPPER EXTREMITY ROM:  Active ROM Right eval Left eval  Shoulder flexion    Shoulder extension    Shoulder abduction    Shoulder adduction    Shoulder extension    Shoulder internal rotation    Shoulder external rotation    Elbow flexion    Elbow extension    Wrist flexion    Wrist extension    Wrist ulnar deviation    Wrist radial deviation    Wrist pronation    Wrist supination     (Blank rows = not tested)  UPPER EXTREMITY MMT:  MMT Right eval Left eval  Shoulder flexion 5 5  Shoulder extension    Shoulder abduction    Shoulder adduction    Shoulder extension    Shoulder internal rotation 5 5  Shoulder external rotation 5 5  Middle trapezius    Lower trapezius    Elbow flexion    Elbow extension    Wrist flexion    Wrist extension    Wrist ulnar deviation    Wrist radial deviation    Wrist pronation    Wrist supination    Grip strength     (Blank rows = not tested)      TODAY'S TREATMENT:   Exercises - Seated Upper Trapezius Stretch  - 1 x daily - 7 x weekly - 3 sets - 3 reps - 20 hold - Gentle Levator Scapulae Stretch  - 1 x daily - 7 x weekly - 3 reps - 20 hold - Theracane Over Shoulder  - 1 x daily - 7 x weekly - 3 sets - 10 reps - Scapular Retraction with Resistance  - 1 x daily - 7 x weekly - 3 sets - 10 reps - Shoulder extension with resistance - Neutral  - 1 x daily - 7 x weekly - 3 sets - 10 reps   PATIENT EDUCATION:  Education details: review HEP, symptom management  Person educated: Patient Education method: Explanation, Demonstration, Tactile cues, Verbal cues, and Handouts Education comprehension: verbalized understanding, returned demonstration, verbal cues required, tactile cues required,  and needs further  education   HOME EXERCISE PROGRAM: Access Code: [email or web address] URL: [email or web address] Date: 08/23/2021 Prepared by: Carolyne Littles  Exercises - Seated Upper Trapezius Stretch  - 1 x daily - 7 x weekly - 3 sets - 3 reps - 20 hold - Gentle Levator Scapulae Stretch  - 1 x daily - 7 x weekly - 3 reps - 20 hold - Theracane Over Shoulder  - 1 x daily - 7 x weekly - 3 sets - 10 reps - Scapular Retraction with Resistance  - 1 x daily - 7 x weekly - 3 sets - 10 reps - Shoulder extension with resistance - Neutral  - 1 x daily - 7 x weekly - 3 sets - 10 reps  ASSESSMENT:  CLINICAL IMPRESSION: Patient is a 66  y.o. female who was seen today for physical therapy evaluation and treatment for upper trap tightness and radicular weakness into her arm. The symptoms are intermittent. She has them about once a month. Therapy was unable to reproduce the symptoms today. She has mild limitations with cervical rotation and she has muscle spasming in the neck. She is having a right total hip on 08/28/2021. It will be 2 weeks before she can drive. She was given stretches and light exercises to try over the next few weeks. She will follow up towards the end of August and we will re-assess her symptom intensity and frequency and adjust her program accordingly.    OBJECTIVE IMPAIRMENTS decreased activity tolerance, decreased ROM, decreased strength, and impaired UE functional use.   ACTIVITY LIMITATIONS reach over head when patient has the impairment weakness with all motions of the shoulder   PARTICIPATION LIMITATIONS:  using her arm when she has the impairment   PERSONAL FACTORS Right Total Shoulder; Patient will have an anterior hip replacement on Tuesday  are also affecting patient's functional outcome.   REHAB POTENTIAL: Excellent  CLINICAL DECISION MAKING: Stable/uncomplicated  EVALUATION COMPLEXITY: Low   GOALS: Goals reviewed with patient? Yes  LONG TERM GOALS: Target date: 10/04/2021 STG  =LTG   Patient will increase bilateral cervical rotation to 75 degrees  Baseline:  Goal status: INITIAL  2.  Patient will have no incidence of weakness and spasming  Baseline:  Goal status: INITIAL  3.  Patient will be independent with a complete program for strengthening  Baseline:  Goal status: INITIAL    PLAN: PT FREQUENCY: 1x/week  PT DURATION: 6 weeks  PLANNED INTERVENTIONS: Therapeutic exercises, Therapeutic activity, Patient/Family education, Self Care, Joint mobilization, Aquatic Therapy, Dry Needling, Cryotherapy, Moist heat, Taping, Manual therapy, and Re-evaluation  PLAN FOR NEXT SESSION: consider dry needling to the upper trap; Advance exercises. Consider seated t-band scap series; consider forward flexion and scaption in the mirror ; continue to monitor rotation. Review HEP; continue with posterior chain exercises. Keep in mind patient will have had an anterior hip replacement at that point.   PHYSICAL THERAPY DISCHARGE SUMMARY  Visits from Start of Care: 1  Current functional level related to goals / functional outcomes: Did not return. Patient was to have a hip replacement    Remaining deficits:    Education / Equipment:   Patient agrees to discharge. Patient goals were not met. Patient is being discharged due to a change in medical status.  Carney Living, PT 08/23/2021, 12:20 PM

## 2021-08-26 NOTE — H&P (Signed)
TOTAL HIP ADMISSION H&P  Patient is admitted for right total hip arthroplasty.  Subjective:  Chief Complaint: right hip pain  HPI: Megan Savage, 66 y.o. female, has a history of pain and functional disability in the right hip(s) due to arthritis and patient has failed non-surgical conservative treatments for greater than 12 weeks to include NSAID's and/or analgesics, corticosteriod injections, and activity modification.  Onset of symptoms was gradual starting 2 years ago with gradually worsening course since that time.The patient noted no past surgery on the right hip(s).  Patient currently rates pain in the right hip at 8 out of 10 with activity. Patient has worsening of pain with activity and weight bearing, pain that interfers with activities of daily living, and pain with passive range of motion. Patient has evidence of joint space narrowing by imaging studies. This condition presents safety issues increasing the risk of falls.   There is no current active infection.  Patient Active Problem List   Diagnosis Date Noted   Mild intermittent acute asthmatic bronchitis 11/09/2020   Abnormal cervical Papanicolaou smear 11/07/2020   Arthritis 11/07/2020   Disorder of skin 11/07/2020   Seasonal allergic rhinitis due to pollen 05/03/2020   Hypothyroidism (acquired) 12/06/2018   S/P shoulder replacement, right 09/17/2018   Trochanteric bursitis of right hip 05/24/2018   Pain in joint of right shoulder 05/17/2018   Osteoarthritis of left knee 03/07/2018   Localized, primary osteoarthritis of shoulder region 03/07/2018   Pain in left knee 01/21/2018   Pulmonary nodule, right 07/21/2017   Left wrist pain 05/28/2017   Pain in joint of right hip 03/31/2017   Chronic cough 10/03/2010   Hypertension 06/28/2010   Hyperlipemia 06/28/2010   Past Medical History:  Diagnosis Date   Anemia    Arthritis    Bronchitis    Depression    denies, borther passed away   Fatigue    resolved    Heart  murmur    Hypercholesterolemia    Hyperlipidemia    controlled    Hypertension    controlled   Hypothyroidism    Insomnia    Obesity    Skin disorder    rosacea    Past Surgical History:  Procedure Laterality Date   APPENDECTOMY  1976   BREAST IMPLANT REMOVAL Bilateral    implanted 1996 removed 2002   CARPAL TUNNEL RELEASE     bilateral    ENDOMETRIAL FULGURATION  1992   foot surgery      tumor removed (left)    HYSTEROSCOPY WITH D & C N/A 06/15/2018   Procedure: DILATATION AND CURETTAGE /HYSTEROSCOPY;  Surgeon: Dian Queen, MD;  Location: [place];  Service: Gynecology;  Laterality: N/A;   hysterscopy      OOPHORECTOMY  1976   left ovary removed    TOTAL SHOULDER ARTHROPLASTY Right 09/17/2018   Procedure: TOTAL SHOULDER ARTHROPLASTY anatomical;  Surgeon: Netta Cedars, MD;  Location: [facility];  Service: Orthopedics;  Laterality: Right;    No current facility-administered medications for this encounter.   Current Outpatient Medications  Medication Sig Dispense Refill Last Dose   Ascorbic Acid (VITAMIN C) 1000 MG tablet Take 1,000 mg by mouth daily.      azelastine (ASTELIN) 0.1 % nasal spray Place 2 sprays into both nostrils 2 (two) times daily. Use in each nostril as directed (Patient taking differently: Place 2 sprays into both nostrils daily as needed for allergies. Use in each nostril as directed) 30 mL 1  budesonide-formoterol (SYMBICORT) 160-4.5 MCG/ACT inhaler Inhale 2 puffs into the lungs in the morning and at bedtime. (Patient taking differently: Inhale 2 puffs into the lungs daily as needed (Cough).) 1 each 6    CALCIUM CITRATE-VITAMIN D PO Take 2 tablets by mouth 2 (two) times daily.  500 mg / 400 mg      Cholecalciferol (VITAMIN D) 50 MCG (2000 UT) tablet Take 2,000 Units by mouth daily.      diclofenac (VOLTAREN) 75 MG EC tablet Take 75 mg by mouth 2 (two) times daily. (Patient not taking: Reported on 08/14/2021)      diphenhydrAMINE HCl, Sleep,  (ZZZQUIL) 50 MG/30ML LIQD Take 30 mg by mouth at bedtime.      estradiol (VIVELLE-DOT) 0.025 MG/24HR Place 1 patch onto the skin 2 (two) times a week.        ezetimibe (ZETIA) 10 MG tablet TAKE ONE TABLET DAILY 90 tablet 3    loratadine (CLARITIN) 10 MG tablet Take 1 tablet (10 mg total) by mouth daily. (Patient taking differently: Take 10 mg by mouth daily as needed for allergies.) 90 tablet 3    multivitamin (THERAGRAN) per tablet Take 1 tablet by mouth daily.        oxybutynin (DITROPAN-XL) 10 MG 24 hr tablet Take 10 mg by mouth daily.      progesterone (PROMETRIUM) 100 MG capsule Take 100 mg by mouth See admin instructions. Days 1-10 of every month      spironolactone (ALDACTONE) 25 MG tablet TAKE ONE-HALF TABLET DAILY 45 tablet 1    TURMERIC CURCUMIN PO Take 500 mg by mouth daily.      Vitamin D, Ergocalciferol, (DRISDOL) 50000 units CAPS capsule Take 50,000 Units by mouth every 30 (thirty) days.       zinc gluconate 50 MG tablet Take 50 mg by mouth daily.      albuterol (VENTOLIN HFA) 108 (90 Base) MCG/ACT inhaler Inhale 2 puffs into the lungs every 6 (six) hours as needed for wheezing or shortness of breath. (Patient not taking: Reported on 08/07/2021) 8 g 6 Not Taking   Choline Fenofibrate (FENOFIBRIC ACID) 135 MG CPDR TAKE ONE CAPSULE EACH DAY 90 capsule 2    levothyroxine (SYNTHROID) 88 MCG tablet TAKE ONE TABLET DAILY BEFORE BREAKFAST 90 tablet 2    meloxicam (MOBIC) 7.5 MG tablet Take 7.5 mg by mouth daily.      olmesartan (BENICAR) 20 MG tablet TAKE ONE TABLET BY MOUTH ONCE DAILY 90 tablet 0    Omega-3 Fatty Acids (FISH OIL) 1000 MG CAPS Take 2 capsules by mouth 2 (two) times daily.      Allergies  Allergen Reactions   Cat Hair Extract Other (See Comments)    Watery eyes    Social History   Tobacco Use   Smoking status: Never   Smokeless tobacco: Never  Substance Use Topics   Alcohol use: Yes    Comment: 2-3 glasses wine daily , 1 drink each weekend     Family History   Problem Relation Age of Onset   Arrhythmia Mother    Emphysema Mother    Asthma Mother    Cancer Father    Cancer Brother        leumkemia   Asthma Brother    Asthma Paternal Aunt      Review of Systems  Constitutional:  Negative for chills and fever.  Respiratory:  Negative for cough and shortness of breath.   Cardiovascular:  Negative for chest pain.  Gastrointestinal:  Negative for nausea and vomiting.  Musculoskeletal:  Positive for arthralgias.     Objective:  Physical Exam Well nourished and well developed. General: Alert and oriented x3, cooperative and pleasant, no acute distress. Head: normocephalic, atraumatic, neck supple. Eyes: EOMI.  Musculoskeletal: Right hip exam: She does have identified pain with hip flexion internal rotation close to 20 degrees with external rotation at 30 degrees pain less painful No significant tenderness laterally today  Calves soft and nontender. Motor function intact in LE. Strength 5/5 LE bilaterally. Neuro: Distal pulses 2+. Sensation to light touch intact in LE.  Vital signs in last 24 hours:    Labs:   Estimated body mass index is 28.59 kg/m as calculated from the following:   Height as of 08/14/21: 5' 4.5" (1.638 m).   Weight as of 08/14/21: 76.7 kg.   Imaging Review Plain radiographs demonstrate severe degenerative joint disease of the right hip(s). The bone quality appears to be adequate for age and reported activity level.      Assessment/Plan:  End stage arthritis, right hip(s)  The patient history, physical examination, clinical judgement of the provider and imaging studies are consistent with end stage degenerative joint disease of the right hip(s) and total hip arthroplasty is deemed medically necessary. The treatment options including medical management, injection therapy, arthroscopy and arthroplasty were discussed at length. The risks and benefits of total hip arthroplasty were presented and reviewed.  The risks due to aseptic loosening, infection, stiffness, dislocation/subluxation,  thromboembolic complications and other imponderables were discussed.  The patient acknowledged the explanation, agreed to proceed with the plan and consent was signed. Patient is being admitted for inpatient treatment for surgery, pain control, PT, OT, prophylactic antibiotics, VTE prophylaxis, progressive ambulation and ADL's and discharge planning.The patient is planning to be discharged  home.  Therapy Plans: HEP Disposition: Home with cousin Planned DVT Prophylaxis: aspirin '81mg'$  BID DME needed: walker PCP: Dr. Virgina Jock, clearance & labs received Cardiologist: Dr. Claiborne Billings, clearance received TXA: IV Allergies: NKDA Anesthesia Concerns: none BMI: 28.5 Last HgbA1c: Not diabetic   Other: - hydrocodone (20), robaxin, tylenol, meloxicam - staying overnight    Costella Hatcher, PA-C Orthopedic Surgery [facility] [place] [phone]

## 2021-08-27 ENCOUNTER — Observation Stay (HOSPITAL_COMMUNITY): Payer: Medicare Other

## 2021-08-27 ENCOUNTER — Ambulatory Visit (HOSPITAL_BASED_OUTPATIENT_CLINIC_OR_DEPARTMENT_OTHER): Payer: Medicare Other | Admitting: Certified Registered"

## 2021-08-27 ENCOUNTER — Encounter (HOSPITAL_COMMUNITY): Payer: Self-pay | Admitting: Orthopedic Surgery

## 2021-08-27 ENCOUNTER — Encounter (HOSPITAL_COMMUNITY)
Admission: RE | Disposition: A | Discharge status: Home / Self Care | Payer: Self-pay | Source: Ambulatory Visit | Attending: Orthopedic Surgery

## 2021-08-27 ENCOUNTER — Observation Stay (HOSPITAL_COMMUNITY)
Admission: RE | Admit: 2021-08-27 | Discharge: 2021-08-28 | Disposition: A | Discharge status: Home / Self Care | Payer: Medicare Other | Source: Ambulatory Visit | Attending: Orthopedic Surgery | Admitting: Orthopedic Surgery

## 2021-08-27 ENCOUNTER — Ambulatory Visit (HOSPITAL_COMMUNITY): Payer: Medicare Other

## 2021-08-27 ENCOUNTER — Other Ambulatory Visit: Payer: Self-pay

## 2021-08-27 ENCOUNTER — Ambulatory Visit (HOSPITAL_COMMUNITY): Payer: Medicare Other | Admitting: Physician Assistant

## 2021-08-27 DIAGNOSIS — Z96611 Presence of right artificial shoulder joint: Secondary | ICD-10-CM | POA: Diagnosis not present

## 2021-08-27 DIAGNOSIS — M1611 Unilateral primary osteoarthritis, right hip: Secondary | ICD-10-CM

## 2021-08-27 DIAGNOSIS — I10 Essential (primary) hypertension: Secondary | ICD-10-CM | POA: Diagnosis not present

## 2021-08-27 DIAGNOSIS — J45909 Unspecified asthma, uncomplicated: Secondary | ICD-10-CM | POA: Diagnosis not present

## 2021-08-27 DIAGNOSIS — Z79899 Other long term (current) drug therapy: Secondary | ICD-10-CM | POA: Diagnosis not present

## 2021-08-27 DIAGNOSIS — E039 Hypothyroidism, unspecified: Secondary | ICD-10-CM | POA: Diagnosis not present

## 2021-08-27 DIAGNOSIS — Z96641 Presence of right artificial hip joint: Secondary | ICD-10-CM

## 2021-08-27 HISTORY — PX: TOTAL HIP ARTHROPLASTY: SHX124

## 2021-08-27 SURGERY — ARTHROPLASTY, HIP, TOTAL, ANTERIOR APPROACH
Anesthesia: Spinal | Site: Hip | Laterality: Right

## 2021-08-27 MED ORDER — ONDANSETRON HCL 4 MG/2ML IJ SOLN
INTRAMUSCULAR | Status: AC
  Filled 2021-08-27: qty 2

## 2021-08-27 MED ORDER — PROPOFOL 10 MG/ML IV BOLUS
INTRAVENOUS | Status: DC | PRN
  Administered 2021-08-27 (×2): 20 mg via INTRAVENOUS

## 2021-08-27 MED ORDER — LACTATED RINGERS IV SOLN: INTRAVENOUS | Status: DC

## 2021-08-27 MED ORDER — PHENOL 1.4 % MT LIQD: 1.0000 | OROMUCOSAL | Status: DC | PRN

## 2021-08-27 MED ORDER — DEXAMETHASONE SODIUM PHOSPHATE 10 MG/ML IJ SOLN
10.0000 mg | Freq: Once | INTRAMUSCULAR | Status: AC
  Administered 2021-08-28: 10 mg via INTRAVENOUS
  Filled 2021-08-27: qty 1

## 2021-08-27 MED ORDER — AMISULPRIDE (ANTIEMETIC) 5 MG/2ML IV SOLN: 10.0000 mg | Freq: Once | INTRAVENOUS | Status: DC | PRN

## 2021-08-27 MED ORDER — EZETIMIBE 10 MG PO TABS
10.0000 mg | ORAL_TABLET | Freq: Every day | ORAL | Status: DC
  Administered 2021-08-28: 10 mg via ORAL
  Filled 2021-08-27: qty 1

## 2021-08-27 MED ORDER — ACETAMINOPHEN 10 MG/ML IV SOLN
1000.0000 mg | Freq: Once | INTRAVENOUS | Status: DC | PRN
Start: 2021-08-27 — End: 2021-08-27
  Administered 2021-08-27: 1000 mg via INTRAVENOUS

## 2021-08-27 MED ORDER — IRBESARTAN 150 MG PO TABS
150.0000 mg | ORAL_TABLET | Freq: Every day | ORAL | Status: DC
  Administered 2021-08-28: 150 mg via ORAL
  Filled 2021-08-27: qty 1

## 2021-08-27 MED ORDER — FENTANYL CITRATE (PF) 100 MCG/2ML IJ SOLN
INTRAMUSCULAR | Status: AC
  Filled 2021-08-27: qty 2

## 2021-08-27 MED ORDER — SPIRONOLACTONE 12.5 MG HALF TABLET
12.5000 mg | ORAL_TABLET | Freq: Every day | ORAL | Status: DC
  Administered 2021-08-28: 12.5 mg via ORAL
  Filled 2021-08-27: qty 1

## 2021-08-27 MED ORDER — ACETAMINOPHEN 10 MG/ML IV SOLN
INTRAVENOUS | Status: AC
  Filled 2021-08-27: qty 100

## 2021-08-27 MED ORDER — LEVOTHYROXINE SODIUM 88 MCG PO TABS
88.0000 ug | ORAL_TABLET | Freq: Every day | ORAL | Status: DC
  Administered 2021-08-28: 88 ug via ORAL
  Filled 2021-08-27: qty 1

## 2021-08-27 MED ORDER — FENTANYL CITRATE PF 50 MCG/ML IJ SOSY
25.0000 ug | PREFILLED_SYRINGE | INTRAMUSCULAR | Status: DC | PRN
  Administered 2021-08-27 (×2): 50 ug via INTRAVENOUS

## 2021-08-27 MED ORDER — ONDANSETRON HCL 4 MG/2ML IJ SOLN
INTRAMUSCULAR | Status: DC | PRN
  Administered 2021-08-27: 4 mg via INTRAVENOUS

## 2021-08-27 MED ORDER — ONDANSETRON HCL 4 MG/2ML IJ SOLN: 4.0000 mg | Freq: Four times a day (QID) | INTRAMUSCULAR | Status: DC | PRN

## 2021-08-27 MED ORDER — PROPOFOL 1000 MG/100ML IV EMUL
INTRAVENOUS | Status: AC
  Filled 2021-08-27: qty 100

## 2021-08-27 MED ORDER — PHENYLEPHRINE HCL-NACL 20-0.9 MG/250ML-% IV SOLN
INTRAVENOUS | Status: AC
  Filled 2021-08-27: qty 250

## 2021-08-27 MED ORDER — TRANEXAMIC ACID-NACL 1000-0.7 MG/100ML-% IV SOLN
1000.0000 mg | Freq: Once | INTRAVENOUS | Status: AC
  Administered 2021-08-27: 1000 mg via INTRAVENOUS
  Filled 2021-08-27: qty 100

## 2021-08-27 MED ORDER — BISACODYL 10 MG RE SUPP: 10.0000 mg | Freq: Every day | RECTAL | Status: DC | PRN

## 2021-08-27 MED ORDER — 0.9 % SODIUM CHLORIDE (POUR BTL) OPTIME
TOPICAL | Status: DC | PRN
  Administered 2021-08-27: 1000 mL

## 2021-08-27 MED ORDER — MIDAZOLAM HCL 2 MG/2ML IJ SOLN
INTRAMUSCULAR | Status: AC
Start: 2021-08-27 — End: ?
  Filled 2021-08-27: qty 2

## 2021-08-27 MED ORDER — METHOCARBAMOL 500 MG PO TABS
500.0000 mg | ORAL_TABLET | Freq: Four times a day (QID) | ORAL | Status: DC | PRN
  Administered 2021-08-27 – 2021-08-28 (×3): 500 mg via ORAL
  Filled 2021-08-27 (×3): qty 1

## 2021-08-27 MED ORDER — MORPHINE SULFATE (PF) 2 MG/ML IV SOLN
0.5000 mg | INTRAVENOUS | Status: DC | PRN
  Administered 2021-08-27 (×2): 1 mg via INTRAVENOUS
  Filled 2021-08-27 (×2): qty 1

## 2021-08-27 MED ORDER — MENTHOL 3 MG MT LOZG
1.0000 | LOZENGE | OROMUCOSAL | Status: DC | PRN
Start: 2021-08-27 — End: 2021-08-28

## 2021-08-27 MED ORDER — TRANEXAMIC ACID-NACL 1000-0.7 MG/100ML-% IV SOLN
1000.0000 mg | INTRAVENOUS | Status: AC
  Administered 2021-08-27: 1000 mg via INTRAVENOUS
  Filled 2021-08-27: qty 100

## 2021-08-27 MED ORDER — FENTANYL CITRATE PF 50 MCG/ML IJ SOSY
PREFILLED_SYRINGE | INTRAMUSCULAR | Status: AC
  Filled 2021-08-27: qty 2

## 2021-08-27 MED ORDER — ONDANSETRON HCL 4 MG PO TABS: 4.0000 mg | ORAL_TABLET | Freq: Four times a day (QID) | ORAL | Status: DC | PRN

## 2021-08-27 MED ORDER — CEFAZOLIN SODIUM-DEXTROSE 2-4 GM/100ML-% IV SOLN
2.0000 g | INTRAVENOUS | Status: AC
  Administered 2021-08-27: 2 g via INTRAVENOUS
  Filled 2021-08-27: qty 100

## 2021-08-27 MED ORDER — METOCLOPRAMIDE HCL 5 MG/ML IJ SOLN: 5.0000 mg | Freq: Three times a day (TID) | INTRAMUSCULAR | Status: DC | PRN

## 2021-08-27 MED ORDER — SODIUM CHLORIDE 0.9 % IV SOLN: INTRAVENOUS | Status: DC

## 2021-08-27 MED ORDER — DOCUSATE SODIUM 100 MG PO CAPS
100.0000 mg | ORAL_CAPSULE | Freq: Two times a day (BID) | ORAL | Status: DC
  Administered 2021-08-27 – 2021-08-28 (×2): 100 mg via ORAL
  Filled 2021-08-27 (×2): qty 1

## 2021-08-27 MED ORDER — AZELASTINE HCL 0.1 % NA SOLN: 2.0000 | Freq: Every day | NASAL | Status: DC | PRN

## 2021-08-27 MED ORDER — ASPIRIN 81 MG PO CHEW
81.0000 mg | CHEWABLE_TABLET | Freq: Two times a day (BID) | ORAL | Status: DC
  Administered 2021-08-27 – 2021-08-28 (×2): 81 mg via ORAL
  Filled 2021-08-27 (×2): qty 1

## 2021-08-27 MED ORDER — METHOCARBAMOL 500 MG IVPB - SIMPLE MED: 500.0000 mg | Freq: Four times a day (QID) | INTRAVENOUS | Status: DC | PRN

## 2021-08-27 MED ORDER — POVIDONE-IODINE 10 % EX SWAB
2.0000 | Freq: Once | CUTANEOUS | Status: AC
Start: 2021-08-27 — End: 2021-08-27
  Administered 2021-08-27: 2 via TOPICAL

## 2021-08-27 MED ORDER — PHENYLEPHRINE HCL-NACL 20-0.9 MG/250ML-% IV SOLN
INTRAVENOUS | Status: DC | PRN
  Administered 2021-08-27: 50 ug/min via INTRAVENOUS

## 2021-08-27 MED ORDER — CHLORHEXIDINE GLUCONATE 0.12 % MT SOLN
15.0000 mL | Freq: Once | OROMUCOSAL | Status: AC
  Administered 2021-08-27: 15 mL via OROMUCOSAL

## 2021-08-27 MED ORDER — BUPIVACAINE IN DEXTROSE 0.75-8.25 % IT SOLN
INTRATHECAL | Status: DC | PRN
  Administered 2021-08-27: 1.6 mL via INTRATHECAL

## 2021-08-27 MED ORDER — FERROUS SULFATE 325 (65 FE) MG PO TABS
325.0000 mg | ORAL_TABLET | Freq: Three times a day (TID) | ORAL | Status: DC
  Administered 2021-08-28 (×2): 325 mg via ORAL
  Filled 2021-08-27 (×2): qty 1

## 2021-08-27 MED ORDER — ONDANSETRON HCL 4 MG/2ML IJ SOLN: 4.0000 mg | Freq: Once | INTRAMUSCULAR | Status: DC | PRN

## 2021-08-27 MED ORDER — ORAL CARE MOUTH RINSE: 15.0000 mL | Freq: Once | OROMUCOSAL | Status: AC

## 2021-08-27 MED ORDER — LORATADINE 10 MG PO TABS: 10.0000 mg | ORAL_TABLET | Freq: Every day | ORAL | Status: DC | PRN

## 2021-08-27 MED ORDER — DIPHENHYDRAMINE HCL 12.5 MG/5ML PO ELIX: 12.5000 mg | ORAL_SOLUTION | ORAL | Status: DC | PRN

## 2021-08-27 MED ORDER — DEXAMETHASONE SODIUM PHOSPHATE 10 MG/ML IJ SOLN
INTRAMUSCULAR | Status: AC
Start: 2021-08-27 — End: ?
  Filled 2021-08-27: qty 1

## 2021-08-27 MED ORDER — MELOXICAM 15 MG PO TABS
15.0000 mg | ORAL_TABLET | Freq: Every day | ORAL | Status: DC
  Administered 2021-08-28: 15 mg via ORAL
  Filled 2021-08-27: qty 1

## 2021-08-27 MED ORDER — ACETAMINOPHEN 325 MG PO TABS
325.0000 mg | ORAL_TABLET | Freq: Four times a day (QID) | ORAL | Status: DC | PRN
  Administered 2021-08-28: 325 mg via ORAL
  Filled 2021-08-27: qty 2

## 2021-08-27 MED ORDER — FENTANYL CITRATE (PF) 100 MCG/2ML IJ SOLN
INTRAMUSCULAR | Status: DC | PRN
Start: 2021-08-27 — End: 2021-08-27
  Administered 2021-08-27: 100 ug via INTRAVENOUS

## 2021-08-27 MED ORDER — HYDROCODONE-ACETAMINOPHEN 5-325 MG PO TABS: 1.0000 | ORAL_TABLET | ORAL | Status: DC | PRN

## 2021-08-27 MED ORDER — DEXAMETHASONE SODIUM PHOSPHATE 10 MG/ML IJ SOLN
8.0000 mg | Freq: Once | INTRAMUSCULAR | Status: AC
  Administered 2021-08-27: 8 mg via INTRAVENOUS

## 2021-08-27 MED ORDER — CEFAZOLIN SODIUM-DEXTROSE 2-4 GM/100ML-% IV SOLN
2.0000 g | Freq: Four times a day (QID) | INTRAVENOUS | Status: AC
  Administered 2021-08-27 – 2021-08-28 (×2): 2 g via INTRAVENOUS
  Filled 2021-08-27 (×2): qty 100

## 2021-08-27 MED ORDER — PROPOFOL 500 MG/50ML IV EMUL
INTRAVENOUS | Status: DC | PRN
  Administered 2021-08-27: 100 ug/kg/min via INTRAVENOUS

## 2021-08-27 MED ORDER — OXYBUTYNIN CHLORIDE ER 5 MG PO TB24
10.0000 mg | ORAL_TABLET | Freq: Every day | ORAL | Status: DC
  Administered 2021-08-28: 10 mg via ORAL
  Filled 2021-08-27: qty 2

## 2021-08-27 MED ORDER — METOCLOPRAMIDE HCL 5 MG PO TABS: 5.0000 mg | ORAL_TABLET | Freq: Three times a day (TID) | ORAL | Status: DC | PRN

## 2021-08-27 MED ORDER — HYDROCODONE-ACETAMINOPHEN 7.5-325 MG PO TABS
1.0000 | ORAL_TABLET | ORAL | Status: DC | PRN
  Administered 2021-08-27 – 2021-08-28 (×3): 2 via ORAL
  Filled 2021-08-27 (×3): qty 2

## 2021-08-27 MED ORDER — MIDAZOLAM HCL 5 MG/5ML IJ SOLN
INTRAMUSCULAR | Status: DC | PRN
  Administered 2021-08-27: 2 mg via INTRAVENOUS

## 2021-08-27 MED ORDER — POLYETHYLENE GLYCOL 3350 17 G PO PACK: 17.0000 g | PACK | Freq: Every day | ORAL | Status: DC | PRN

## 2021-08-27 SURGICAL SUPPLY — 43 items
BAG COUNTER SPONGE SURGICOUNT (BAG) IMPLANT
BAG DECANTER FOR FLEXI CONT (MISCELLANEOUS) IMPLANT
BAG ZIPLOCK 12X15 (MISCELLANEOUS) IMPLANT
BLADE SAG 18X100X1.27 (BLADE) ×2 IMPLANT
COVER PERINEAL POST (MISCELLANEOUS) ×2 IMPLANT
COVER SURGICAL LIGHT HANDLE (MISCELLANEOUS) ×2 IMPLANT
CUP ACETBLR 52 OD PINNACLE (Hips) ×1 IMPLANT
DERMABOND ADVANCED (GAUZE/BANDAGES/DRESSINGS) ×1
DERMABOND ADVANCED .7 DNX12 (GAUZE/BANDAGES/DRESSINGS) ×1 IMPLANT
DRAPE FOOT SWITCH (DRAPES) ×2 IMPLANT
DRAPE STERI IOBAN 125X83 (DRAPES) ×2 IMPLANT
DRAPE U-SHAPE 47X51 STRL (DRAPES) ×4 IMPLANT
DRESSING AQUACEL AG SP 3.5X10 (GAUZE/BANDAGES/DRESSINGS) ×1 IMPLANT
DRSG AQUACEL AG ADV 3.5X10 (GAUZE/BANDAGES/DRESSINGS) ×1 IMPLANT
DRSG AQUACEL AG SP 3.5X10 (GAUZE/BANDAGES/DRESSINGS) ×2
DURAPREP 26ML APPLICATOR (WOUND CARE) ×2 IMPLANT
ELECT BLADE TIP CTD 4 INCH (ELECTRODE) ×1 IMPLANT
ELECT REM PT RETURN 15FT ADLT (MISCELLANEOUS) ×2 IMPLANT
ELIMINATOR HOLE APEX DEPUY (Hips) ×1 IMPLANT
GLOVE BIO SURGEON STRL SZ 6 (GLOVE) ×2 IMPLANT
GLOVE BIOGEL PI IND STRL 6.5 (GLOVE) ×1 IMPLANT
GLOVE BIOGEL PI IND STRL 7.5 (GLOVE) ×1 IMPLANT
GLOVE BIOGEL PI INDICATOR 6.5 (GLOVE) ×1
GLOVE BIOGEL PI INDICATOR 7.5 (GLOVE) ×1
GLOVE ORTHO TXT STRL SZ7.5 (GLOVE) ×4 IMPLANT
GOWN STRL REUS W/ TWL LRG LVL3 (GOWN DISPOSABLE) ×3 IMPLANT
GOWN STRL REUS W/TWL LRG LVL3 (GOWN DISPOSABLE) ×6
HEAD CERAMIC DELTA 36 PLUS 1.5 (Hips) ×1 IMPLANT
HOLDER FOLEY CATH W/STRAP (MISCELLANEOUS) ×2 IMPLANT
KIT TURNOVER KIT A (KITS) IMPLANT
LINER NEUTRAL 52X36MM PLUS 4 (Liner) ×1 IMPLANT
PACK ANTERIOR HIP CUSTOM (KITS) ×2 IMPLANT
SCREW 6.5MMX30MM (Screw) ×1 IMPLANT
STEM FEM ACTIS HIGH SZ2 (Stem) ×1 IMPLANT
SUT MNCRL AB 4-0 PS2 18 (SUTURE) ×2 IMPLANT
SUT STRATAFIX 0 PDS 27 VIOLET (SUTURE) ×2
SUT VIC AB 1 CT1 36 (SUTURE) ×6 IMPLANT
SUT VIC AB 2-0 CT1 27 (SUTURE) ×4
SUT VIC AB 2-0 CT1 TAPERPNT 27 (SUTURE) ×2 IMPLANT
SUTURE STRATFX 0 PDS 27 VIOLET (SUTURE) ×1 IMPLANT
TRAY FOLEY MTR SLVR 16FR STAT (SET/KITS/TRAYS/PACK) IMPLANT
TUBE SUCTION HIGH CAP CLEAR NV (SUCTIONS) ×2 IMPLANT
WATER STERILE IRR 1000ML POUR (IV SOLUTION) ×2 IMPLANT

## 2021-08-27 NOTE — Care Plan (Signed)
Ortho Bundle Case Management Note  Patient Details  Name: Megan Savage MRN: [id number] Date of Birth: 18-Aug-1955  R THA on 08-27-21 DCP:  Home with cousin DME:  RW ordered through [facility] PT:  HEP                   DME Arranged:  Gilford Rile rolling DME Agency:  [employer]  HH Arranged:  NA [place] Agency:  NA  Additional Comments: Please contact me with any questions of if this plan should need to change.  Marianne Sofia, RN,CCM [facility]  [phone] 08/27/2021, 3:29 PM

## 2021-08-27 NOTE — Interval H&P Note (Signed)
History and Physical Interval Note:  08/27/2021 2:54 PM  Megan Savage  has presented today for surgery, with the diagnosis of Right hip osteoarthritis.  The various methods of treatment have been discussed with the patient and family. After consideration of risks, benefits and other options for treatment, the patient has consented to  Procedure(s): TOTAL HIP ARTHROPLASTY ANTERIOR APPROACH (Right) as a surgical intervention.  The patient's history has been reviewed, patient examined, no change in status, stable for surgery.  I have reviewed the patient's chart and labs.  Questions were answered to the patient's satisfaction.     Mauri Pole

## 2021-08-27 NOTE — Anesthesia Procedure Notes (Signed)
Spinal  Patient location during procedure: OR Start time: 08/27/2021 4:04 PM End time: 08/27/2021 4:06 PM Reason for block: surgical anesthesia Staffing Anesthesiologist: Murvin Natal, MD Resident/CRNA: Lind Covert, CRNA Performed by: Lind Covert, CRNA Authorized by: Murvin Natal, MD   Preanesthetic Checklist Completed: patient identified, IV checked, site marked, risks and benefits discussed, surgical consent, monitors and equipment checked, pre-op evaluation and timeout performed Spinal Block Patient position: sitting Prep: DuraPrep Patient monitoring: heart rate, continuous pulse ox, blood pressure and cardiac monitor Approach: midline Location: L2-3 Injection technique: single-shot Needle Needle type: Pencan  Needle gauge: 24 G Needle length: 10 cm Needle insertion depth: 7 cm Assessment Sensory level: T6 Events: CSF return Additional Notes Timeout performed. Site identified cleansed with duraprep. L3-4 attempted. SAB at L2-3 without difficulty. Patient to supine position

## 2021-08-27 NOTE — Anesthesia Postprocedure Evaluation (Signed)
Anesthesia Post Note  Patient: Megan Savage  Procedure(s) Performed: TOTAL HIP ARTHROPLASTY ANTERIOR APPROACH (Right: Hip)     Patient location during evaluation: PACU Anesthesia Type: Spinal Level of consciousness: awake Pain management: pain level controlled Vital Signs Assessment: post-procedure vital signs reviewed and stable Respiratory status: spontaneous breathing, nonlabored ventilation, respiratory function stable and patient connected to nasal cannula oxygen Cardiovascular status: stable and blood pressure returned to baseline Postop Assessment: no apparent nausea or vomiting Anesthetic complications: no   No notable events documented.  Last Vitals:  Vitals:   08/27/21 1932 08/27/21 1935  BP: (!) 142/64 133/66  Pulse: 77 78  Resp: 16   Temp: 36.7 C   SpO2: 99% 99%    Last Pain:  Vitals:   08/27/21 1932  TempSrc: Oral  PainSc: 10-Worst pain ever                 Hyla Coard P Elsbeth Yearick

## 2021-08-27 NOTE — Plan of Care (Signed)
  Problem: Education: Goal: Knowledge of General Education information will improve Description Including pain rating scale, medication(s)/side effects and non-pharmacologic comfort measures Outcome: Progressing   

## 2021-08-27 NOTE — Op Note (Signed)
NAME:  FLORINDA TAFLINGER                ACCOUNT NO.: [id number]      MEDICAL RECORD NO.: [id number]      FACILITY:  [facility]      PHYSICIAN:  Mauri Pole  DATE OF BIRTH:  03-29-1955     DATE OF PROCEDURE:  08/27/2021                                 OPERATIVE REPORT         PREOPERATIVE DIAGNOSIS: Right  hip osteoarthritis.      POSTOPERATIVE DIAGNOSIS:  Right hip osteoarthritis.      PROCEDURE:  Right total hip replacement through an anterior approach   utilizing DePuy THR system, component size 52 mm pinnacle cup, a size 36+4 neutral   Altrex liner, a size 2 Hi Actis stem with a 36+1.5 delta ceramic   ball.      SURGEON:  Pietro Cassis. Alvan Dame, M.D.      ASSISTANT:  Surgical team     ANESTHESIA:  Spinal.      SPECIMENS:  None.      COMPLICATIONS:  None.      BLOOD LOSS:  400 cc     DRAINS:  None.      INDICATION OF THE PROCEDURE:  Megan Savage is a 66 y.o. female who had   presented to office for evaluation of right hip pain.  Radiographs revealed   progressive degenerative changes with bone-on-bone   articulation of the  hip joint, including subchondral cystic changes and osteophytes.  The patient had painful limited range of   motion significantly affecting their overall quality of life and function.  The patient was failing to    respond to conservative measures including medications and/or injections and activity modification and at this point was ready   to proceed with more definitive measures.  Consent was obtained for   benefit of pain relief.  Specific risks of infection, DVT, component   failure, dislocation, neurovascular injury, and need for revision surgery were reviewed in the office.     PROCEDURE IN DETAIL:  The patient was brought to operative theater.   Once adequate anesthesia, preoperative antibiotics, 2 gm of Ancef, 1 gm of Tranexamic Acid, and 10 mg of Decadron were administered, the patient was positioned supine on the [employer]  table.  Once the patient was safely positioned with adequate padding of boney prominences we predraped out the hip, and used fluoroscopy to confirm orientation of the pelvis.      The right hip was then prepped and draped from proximal iliac crest to   mid thigh with a shower curtain technique.      Time-out was performed identifying the patient, planned procedure, and the appropriate extremity.     An incision was then made 2 cm lateral to the   anterior superior iliac spine extending over the orientation of the   tensor fascia lata muscle and sharp dissection was carried down to the   fascia of the muscle.      The fascia was then incised.  The muscle belly was identified and swept   laterally and retractor placed along the superior neck.  Following   cauterization of the circumflex vessels and removing some pericapsular   fat, a second cobra retractor was placed on the inferior neck.  A T-capsulotomy was made along the line of the   superior neck to the trochanteric fossa, then extended proximally and   distally.  Tag sutures were placed and the retractors were then placed   intracapsular.  We then identified the trochanteric fossa and   orientation of my neck cut and then made a neck osteotomy with the femur on traction.  The femoral   head was removed without difficulty or complication.  Traction was let   off and retractors were placed posterior and anterior around the   acetabulum.      The labrum and foveal tissue were debrided.  I began reaming with a 45 mm   reamer and reamed up to 51 mm reamer with good bony bed preparation and a 52 mm  cup was chosen.  The final 52 mm Pinnacle cup was then impacted under fluoroscopy to confirm the depth of penetration and orientation with respect to   Abduction and forward flexion.  A screw was placed into the ilium followed by the hole eliminator.  The final   36+4 neutral Altrex liner was impacted with good visualized rim fit.  The cup was  positioned anatomically within the acetabular portion of the pelvis.      At this point, the femur was rolled to 100 degrees.  Further capsule was   released off the inferior aspect of the femoral neck.  I then   released the superior capsule proximally.  With the leg in a neutral position the hook was placed laterally   along the femur under the vastus lateralis origin and elevated manually and then held in position using the hook attachment on the bed.  The leg was then extended and adducted with the leg rolled to 100   degrees of external rotation.  Retractors were placed along the medial calcar and posteriorly over the greater trochanter.  Once the proximal femur was fully   exposed, I used a box osteotome to set orientation.  I then began   broaching with the starting chili pepper broach and passed this by hand and then broached up to 2.  With the 2 broach in place I chose a high offset neck and did several trial reductions.  The offset was appropriate, leg lengths   appeared to be equal best matched with the +1.5 head ball trial confirmed radiographically.   Given these findings, I went ahead and dislocated the hip, repositioned all   retractors and positioned the right hip in the extended and abducted position.  The final 2 Hi Actis stem was   chosen and it was impacted down to the level of neck cut.  Based on this   and the trial reductions, a final 36+1.5 delta ceramic ball was chosen and   impacted onto a clean and dry trunnion, and the hip was reduced.  The   hip had been irrigated throughout the case again at this point.  I did   reapproximate the superior capsular leaflet to the anterior leaflet   using #1 Vicryl.  The fascia of the   tensor fascia lata muscle was then reapproximated using #1 Vicryl and #0 Stratafix sutures.  The   remaining wound was closed with 2-0 Vicryl and running 4-0 Monocryl.   The hip was cleaned, dried, and dressed sterilely using Dermabond and   Aquacel  dressing.  The patient was then brought   to recovery room in stable condition tolerating the procedure well.  Pietro Cassis Alvan Dame, M.D.        08/27/2021 4:22 PM

## 2021-08-27 NOTE — Discharge Instructions (Signed)

## 2021-08-27 NOTE — Transfer of Care (Signed)
Immediate Anesthesia Transfer of Care Note  Patient: Megan Savage  Procedure(s) Performed: TOTAL HIP ARTHROPLASTY ANTERIOR APPROACH (Right: Hip)  Patient Location: PACU  Anesthesia Type:Spinal  Level of Consciousness: awake, alert , oriented and patient cooperative  Airway & Oxygen Therapy: Patient Spontanous Breathing and Patient connected to face mask oxygen  Post-op Assessment: Report given to RN and Post -op Vital signs reviewed and stable  Post vital signs: Reviewed and stable  Last Vitals:  Vitals Value Taken Time  BP 118/70 08/27/21 1751  Temp    Pulse 79 08/27/21 1752  Resp 16 08/27/21 1752  SpO2 99 % 08/27/21 1752  Vitals shown include unvalidated device data.  Last Pain:  Vitals:   08/27/21 1415  TempSrc: Oral  PainSc: 2       Patients Stated Pain Goal: 4 (97/84/78 4128)  Complications: No notable events documented.

## 2021-08-27 NOTE — Anesthesia Preprocedure Evaluation (Signed)
Anesthesia Evaluation  Patient identified by MRN, date of birth, ID band Patient awake    Reviewed: Allergy & Precautions, NPO status , Patient's Chart, lab work & pertinent test results  Airway Mallampati: III  TM Distance: >3 FB Neck ROM: Full    Dental no notable dental hx.    Pulmonary asthma ,    Pulmonary exam normal        Cardiovascular hypertension, Pt. on medications Normal cardiovascular exam     Neuro/Psych PSYCHIATRIC DISORDERS Depression negative neurological ROS     GI/Hepatic negative GI ROS, Neg liver ROS,   Endo/Other  Hypothyroidism   Renal/GU negative Renal ROS     Musculoskeletal  (+) Arthritis ,   Abdominal   Peds  Hematology negative hematology ROS (+)   Anesthesia Other Findings Right hip osteoarthritis  Reproductive/Obstetrics                             Anesthesia Physical Anesthesia Plan  ASA: 2  Anesthesia Plan: Spinal   Post-op Pain Management:    Induction: Intravenous  PONV Risk Score and Plan: 2 and Ondansetron, Dexamethasone, Propofol infusion, Midazolam and Treatment may vary due to age or medical condition  Airway Management Planned: Simple Face Mask  Additional Equipment:   Intra-op Plan:   Post-operative Plan:   Informed Consent: I have reviewed the patients History and Physical, chart, labs and discussed the procedure including the risks, benefits and alternatives for the proposed anesthesia with the patient or authorized representative who has indicated his/her understanding and acceptance.     Dental advisory given  Plan Discussed with: CRNA  Anesthesia Plan Comments:         Anesthesia Quick Evaluation

## 2021-08-28 ENCOUNTER — Encounter (HOSPITAL_COMMUNITY): Payer: Self-pay | Admitting: Orthopedic Surgery

## 2021-08-28 DIAGNOSIS — M1611 Unilateral primary osteoarthritis, right hip: Secondary | ICD-10-CM | POA: Diagnosis not present

## 2021-08-28 LAB — BASIC METABOLIC PANEL
Anion gap: 8 (ref 5–15)
BUN: 11 mg/dL (ref 8–23)
CO2: 22 mmol/L (ref 22–32)
Calcium: 8.7 mg/dL — ABNORMAL LOW (ref 8.9–10.3)
Chloride: 105 mmol/L (ref 98–111)
Creatinine, Ser: 0.44 mg/dL (ref 0.44–1.00)
GFR, Estimated: >60 mL/min (ref >60)
Glucose, Bld: 191 mg/dL — ABNORMAL HIGH (ref 70–99)
Potassium: 4.1 mmol/L (ref 3.5–5.1)
Sodium: 135 mmol/L (ref 135–145)

## 2021-08-28 LAB — CBC
HCT: 33.8 % — ABNORMAL LOW (ref 36.0–46.0)
Hemoglobin: 11.3 g/dL — ABNORMAL LOW (ref 12.0–15.0)
MCH: 30.1 pg (ref 26.0–34.0)
MCHC: 33.4 g/dL (ref 30.0–36.0)
MCV: 90.1 fL (ref 80.0–100.0)
Platelets: 249 10*3/uL (ref 150–400)
RBC: 3.75 MIL/uL — ABNORMAL LOW (ref 3.87–5.11)
RDW: 11.9 % (ref 11.5–15.5)
WBC: 8 10*3/uL (ref 4.0–10.5)
nRBC: 0 % (ref 0.0–0.2)

## 2021-08-28 MED ORDER — HYDROCODONE-ACETAMINOPHEN 5-325 MG PO TABS: 1.0000 | ORAL_TABLET | ORAL | 0 refills | Status: DC | PRN

## 2021-08-28 MED ORDER — POLYETHYLENE GLYCOL 3350 17 G PO PACK: 17.0000 g | PACK | Freq: Every day | ORAL | 0 refills | Status: DC | PRN

## 2021-08-28 MED ORDER — ASPIRIN 81 MG PO CHEW: 81.0000 mg | CHEWABLE_TABLET | Freq: Two times a day (BID) | ORAL | 0 refills | Status: AC

## 2021-08-28 MED ORDER — MELOXICAM 15 MG PO TABS: 15.0000 mg | ORAL_TABLET | Freq: Every day | ORAL | 0 refills | Status: AC

## 2021-08-28 MED ORDER — METHOCARBAMOL 500 MG PO TABS
500.0000 mg | ORAL_TABLET | Freq: Four times a day (QID) | ORAL | 0 refills | Status: DC | PRN
Start: 2021-08-28 — End: 2021-11-04

## 2021-08-28 MED ORDER — DOCUSATE SODIUM 100 MG PO CAPS: 100.0000 mg | ORAL_CAPSULE | Freq: Two times a day (BID) | ORAL | 0 refills | Status: DC

## 2021-08-28 NOTE — Evaluation (Signed)
Physical Therapy Evaluation Patient Details Name: Megan Savage MRN: [id number] DOB: 05/11/1955 Today's Date: 08/28/2021  History of Present Illness  66 yo female s/p R DA THA. PMH:  R TSA, HTN, bil CTR  Clinical Impression  Pt is s/p THA resulting in the deficits listed below (see PT Problem List).  Pt doing well, anticipate d/c later today   Pt will benefit from skilled PT to increase their independence and safety with mobility to allow discharge to the venue listed below.         Recommendations for follow up therapy are one component of a multi-disciplinary discharge planning process, led by the attending physician.  Recommendations may be updated based on patient status, additional functional criteria and insurance authorization.  Follow Up Recommendations Follow physician's recommendations for discharge plan and follow up therapies      Assistance Recommended at Discharge PRN  Patient can return home with the following  Help with stairs or ramp for entrance;Assist for transportation    Equipment Recommendations    Recommendations for Other Services       Functional Status Assessment Patient has had a recent decline in their functional status and demonstrates the ability to make significant improvements in function in a reasonable and predictable amount of time.     Precautions / Restrictions Precautions Precautions: None Restrictions Weight Bearing Restrictions: No Other Position/Activity Restrictions: WBAT      Mobility  Bed Mobility Overal bed mobility: Modified Independent                  Transfers Overall transfer level: Needs assistance Equipment used: Rolling walker (2 wheels) Transfers: Sit to/from Stand Sit to Stand: Supervision           General transfer comment: cues for hand placement    Ambulation/Gait Ambulation/Gait assistance: Min guard, Supervision Gait Distance (Feet): 300 Feet Assistive device: Rolling walker (2 wheels) Gait  Pattern/deviations: Step-to pattern, Step-through pattern, Decreased stance time - right       General Gait Details: cues for sequence, step length, posture and RW position  Stairs Stairs: Yes Stairs assistance: Min guard Stair Management: One rail Right, One rail Left, Sideways Number of Stairs: 4 General stair comments: cues for sequence and safety; no LOB  Wheelchair Mobility    Modified Rankin (Stroke Patients Only)       Balance                                             Pertinent Vitals/Pain Pain Assessment Pain Assessment: 0-10 Pain Location: right hip Pain Descriptors / Indicators: Aching, Sore Pain Intervention(s): Limited activity within patient's tolerance, Monitored during session, Premedicated before session, Repositioned    Home Living Family/patient expects to be discharged to:: Private residence Living Arrangements: Alone (cousin) Available Help at Discharge: Family Type of Home: House Home Access: Stairs to enter Entrance Stairs-Rails: Right Entrance Stairs-Number of Steps: 3   Home Layout: One level Home Equipment: Conservation officer, nature (2 wheels)      Prior Function Prior Level of Function : Independent/Modified Independent                     Hand Dominance        Extremity/Trunk Assessment   Upper Extremity Assessment Upper Extremity Assessment: Overall WFL for tasks assessed    Lower Extremity Assessment Lower Extremity Assessment: RLE deficits/detail  RLE Deficits / Details: AAROM hip WFL, knee and ankle  WFL; strength grossly 3/5       Communication   Communication: No difficulties  Cognition Arousal/Alertness: Awake/alert Behavior During Therapy: WFL for tasks assessed/performed Overall Cognitive Status: Within Functional Limits for tasks assessed                                          General Comments      Exercises Total Joint Exercises Ankle Circles/Pumps: AROM, 10 reps,  Both Quad Sets: AROM, Both, 5 reps   Assessment/Plan    PT Assessment Patient needs continued PT services  PT Problem List Decreased strength;Decreased mobility;Decreased range of motion;Decreased activity tolerance;Decreased knowledge of use of DME;Pain       PT Treatment Interventions DME instruction;Therapeutic exercise;Gait training;Functional mobility training;Therapeutic activities;Patient/family education;Stair training    PT Goals (Current goals can be found in the Care Plan section)  Acute Rehab PT Goals Patient Stated Goal: play golf PT Goal Formulation: With patient Time For Goal Achievement: 09/04/21 Potential to Achieve Goals: Good    Frequency 7X/week     Co-evaluation               AM-PAC PT "6 Clicks" Mobility  Outcome Measure Help needed turning from your back to your side while in a flat bed without using bedrails?: None Help needed moving from lying on your back to sitting on the side of a flat bed without using bedrails?: None Help needed moving to and from a bed to a chair (including a wheelchair)?: A Little Help needed standing up from a chair using your arms (e.g., wheelchair or bedside chair)?: A Little Help needed to walk in hospital room?: A Little Help needed climbing 3-5 steps with a railing? : A Little 6 Click Score: 20    End of Session Equipment Utilized During Treatment: Gait belt Activity Tolerance: Patient tolerated treatment well Patient left: in chair;with call bell/phone within reach;with chair alarm set   PT Visit Diagnosis: Other abnormalities of gait and mobility (R26.89);Difficulty in walking, not elsewhere classified (R26.2)    Time:  -      Charges:              Baxter Flattery, PT  Acute Rehab Dept [facility]) [phone]  [facility] Weekend Pager [facility] only)  [phone]  08/28/2021   [facility] 08/28/2021, 10:11 AM

## 2021-08-28 NOTE — Plan of Care (Signed)
  Problem: Activity: Goal: Risk for activity intolerance will decrease Outcome: Progressing   Problem: Pain Managment: Goal: General experience of comfort will improve Outcome: Progressing   Problem: Safety: Goal: Ability to remain free from injury will improve Outcome: Progressing   

## 2021-08-28 NOTE — Progress Notes (Signed)
PT TX NOTE  08/28/21 1200  PT Visit Information  Last PT Received On 08/28/21  Pt progressing well. Reviewed HEP and progression/overall activity progression. Pt is ready to d/c with assist as needed from Pt standpoint.  Assistance Needed +1  History of Present Illness 66 yo female s/p R DA THA. PMH:  R TSA, HTN, bil CTR  Subjective Data  Patient Stated Goal play golf  Precautions  Precautions None  Restrictions  Weight Bearing Restrictions No  Other Position/Activity Restrictions WBAT  Pain Assessment  Pain Assessment 0-10  Pain Score 5  Pain Location right hip  Pain Descriptors / Indicators Aching;Sore  Pain Intervention(s) Limited activity within patient's tolerance;Monitored during session;Premedicated before session  Cognition  Arousal/Alertness Awake/alert  Behavior During Therapy WFL for tasks assessed/performed  Overall Cognitive Status Within Functional Limits for tasks assessed  Transfers  Overall transfer level Needs assistance  Equipment used Rolling walker (2 wheels)  Transfers Sit to/from Stand  Sit to Stand Supervision;Modified independent (Device/Increase time)  General transfer comment cues for hand placement and overall safety. pt self cues end of session  Ambulation/Gait  Ambulation/Gait assistance Supervision;Modified independent (Device/Increase time)  Gait Distance (Feet) 15 Feet (x2)  Assistive device Rolling walker (2 wheels)  Gait Pattern/deviations Step-to pattern;Step-through pattern;Decreased stance time - right  General Gait Details cues for sequence, step length, posture and RW position. pt begins step through pattern and demonstrates good carryover  Total Joint Exercises  Ankle Circles/Pumps AROM;Both;10 reps  Quad Sets AROM;Both;15 reps  Heel Slides AROM;Right;15 reps  Hip ABduction/ADduction AROM;15 reps;Right  PT - End of Session  Equipment Utilized During Treatment Gait belt  Activity Tolerance Patient tolerated treatment well  Patient  left in chair;with call bell/phone within reach;with chair alarm set;with family/visitor present  Nurse Communication Mobility status   PT - Assessment/Plan  PT Plan Current plan remains appropriate  PT Visit Diagnosis Other abnormalities of gait and mobility (R26.89);Difficulty in walking, not elsewhere classified (R26.2)  PT Frequency (ACUTE ONLY) 7X/week  Follow Up Recommendations Follow physician's recommendations for discharge plan and follow up therapies  Assistance recommended at discharge PRN  Patient can return home with the following Help with stairs or ramp for entrance;Assist for transportation  AM-PAC PT "6 Clicks" Mobility Outcome Measure (Version 2)  Help needed turning from your back to your side while in a flat bed without using bedrails? 4  Help needed moving from lying on your back to sitting on the side of a flat bed without using bedrails? 4  Help needed moving to and from a bed to a chair (including a wheelchair)? 4  Help needed standing up from a chair using your arms (e.g., wheelchair or bedside chair)? 4  Help needed to walk in hospital room? 3  Help needed climbing 3-5 steps with a railing?  3  6 Click Score 22  Consider Recommendation of Discharge To: Home with no services  Progressive Mobility  What is the highest level of mobility based on the progressive mobility assessment? Level 5 (Walks with assist in room/hall) - Balance while stepping forward/back and can walk in room with assist - Complete  Activity Ambulated with assistance in hallway  PT Goal Progression  Progress towards PT goals Progressing toward goals  Acute Rehab PT Goals  PT Goal Formulation With patient  Time For Goal Achievement 09/04/21  Potential to Achieve Goals Good  PT Time Calculation  PT Start Time (ACUTE ONLY) 1216  PT Stop Time (ACUTE ONLY) 1244  PT Time  Calculation (min) (ACUTE ONLY) 28 min  PT General Charges  $$ ACUTE PT VISIT 1 Visit  PT Treatments  $Therapeutic Exercise  8-22 mins

## 2021-08-28 NOTE — TOC Transition Note (Signed)
Transition of Care [facility]) - CM/SW Discharge Note  Patient Details  Name: Megan Savage MRN: [id number] Date of Birth: Apr 13, 1955  Transition of Care [facility]) CM/SW Contact:  Sherie Don, LCSW Phone Number: 08/28/2021, 10:36 AM  Clinical Narrative: Patient is expected to discharge home after working with PT. CSW met with patient to confirm discharge plan and needs. Patient will go home with a home exercise program (HEP). Patient will need a rolling walker, which was delivered to patient's room by [employer]. TOC signing off.  Final next level of care: Home/Self Care Barriers to Discharge: No Barriers Identified  Patient Goals and CMS Choice Patient states their goals for this hospitalization and ongoing recovery are:: Return home with HEP CMS Medicare.gov Compare Post Acute Care list provided to:: Patient Choice offered to / list presented to : Patient  Discharge Plan and Services        DME Arranged: Walker rolling DME Agency: [employer] Representative spoke with at DME Agency: Prearranged in orthopedist's office HH Arranged: NA [place] Agency: NA  Readmission Risk Interventions     No data to display

## 2021-08-28 NOTE — Progress Notes (Signed)
24 hour chart audit completed 

## 2021-08-28 NOTE — Progress Notes (Signed)
Patient ID: AIJA SCARFO, female   DOB: Jan 27, 1956, 66 y.o.   MRN: [id number] Subjective: 1 Day Post-Op Procedure(s) (LRB): TOTAL HIP ARTHROPLASTY ANTERIOR APPROACH (Right)    Patient reports pain as mild. No events over night Does and has had some complaints or concerns about right knee pain (will follow up with this post operatively in the office)  Objective:   VITALS:   Vitals:   08/28/21 0118 08/28/21 0526  BP: (!) 154/83 (!) 165/76  Pulse: 80 83  Resp: 18 18  Temp: 98.2 F (36.8 C) 98.2 F (36.8 C)  SpO2: 98% 100%    Neurovascular intact Incision: dressing C/D/I  LABS Recent Labs    08/28/21 0221  HGB 11.3*  HCT 33.8*  WBC 8.0  PLT 249    Recent Labs    08/28/21 0221  NA 135  K 4.1  BUN 11  CREATININE 0.44  GLUCOSE 191*    No results for input(s): "LABPT", "INR" in the last 72 hours.   Assessment/Plan: 1 Day Post-Op Procedure(s) (LRB): TOTAL HIP ARTHROPLASTY ANTERIOR APPROACH (Right)   Advance diet Up with therapy Home after therapy RTC in 2 weeks

## 2021-08-28 NOTE — Progress Notes (Signed)
Discharge instructions given to patient. Verbalizes understanding.

## 2021-09-09 NOTE — Discharge Summary (Signed)
Patient ID: Megan Savage MRN: [id number] DOB/AGE: 09-04-1955 66 y.o.  Admit date: 08/27/2021 Discharge date: 08/28/2021  Admission Diagnoses:  Right hip osteoarthritis  Discharge Diagnoses:  Principal Problem:   S/P total right hip arthroplasty   Past Medical History:  Diagnosis Date   Anemia    Arthritis    Bronchitis    Depression    denies, borther passed away   Fatigue    resolved    Heart murmur    Hypercholesterolemia    Hyperlipidemia    controlled    Hypertension    controlled   Hypothyroidism    Insomnia    Obesity    Skin disorder    rosacea    Surgeries: Procedure(s): TOTAL HIP ARTHROPLASTY ANTERIOR APPROACH on 08/27/2021   Consultants:   Discharged Condition: Improved  Hospital Course: Megan Savage is an 66 y.o. female who was admitted 08/27/2021 for operative treatment ofS/P total right hip arthroplasty. Patient has severe unremitting pain that affects sleep, daily activities, and work/hobbies. After pre-op clearance the patient was taken to the operating room on 08/27/2021 and underwent  Procedure(s): TOTAL HIP ARTHROPLASTY ANTERIOR APPROACH.    Patient was given perioperative antibiotics:  Anti-infectives (From admission, onward)    Start     Dose/Rate Route Frequency Ordered Stop   08/27/21 2200  ceFAZolin (ANCEF) IVPB 2g/100 mL premix        2 g 200 mL/hr over 30 Minutes Intravenous Every 6 hours 08/27/21 1919 08/28/21 0510   08/27/21 1415  ceFAZolin (ANCEF) IVPB 2g/100 mL premix        2 g 200 mL/hr over 30 Minutes Intravenous On call to O.R. 08/27/21 1408 08/27/21 1607        Patient was given sequential compression devices, early ambulation, and chemoprophylaxis to prevent DVT. Patient worked with PT and was meeting their goals regarding safe ambulation and transfers.  Patient benefited maximally from hospital stay and there were no complications.    Recent vital signs: No data found.   Recent laboratory studies: No results for input(s):  "WBC", "HGB", "HCT", "PLT", "NA", "K", "CL", "CO2", "BUN", "CREATININE", "GLUCOSE", "INR", "CALCIUM" in the last 72 hours.  Invalid input(s): "PT", "2"   Discharge Medications:   Allergies as of 08/28/2021       Reactions   Cat Hair Extract Other (See Comments)   Watery eyes        Medication List     STOP taking these medications    albuterol 108 (90 Base) MCG/ACT inhaler Commonly known as: VENTOLIN HFA   diclofenac 75 MG EC tablet Commonly known as: VOLTAREN       TAKE these medications    aspirin 81 MG chewable tablet Chew 1 tablet (81 mg total) by mouth 2 (two) times daily for 28 days.   azelastine 0.1 % nasal spray Commonly known as: ASTELIN Place 2 sprays into both nostrils 2 (two) times daily. Use in each nostril as directed What changed:  when to take this reasons to take this   budesonide-formoterol 160-4.5 MCG/ACT inhaler Commonly known as: Symbicort Inhale 2 puffs into the lungs in the morning and at bedtime. What changed:  when to take this reasons to take this   CALCIUM CITRATE-VITAMIN D PO Take 2 tablets by mouth 2 (two) times daily.  500 mg / 400 mg   docusate sodium 100 MG capsule Commonly known as: COLACE Take 1 capsule (100 mg total) by mouth 2 (two) times daily.   estradiol 0.025  MG/24HR Commonly known as: VIVELLE-DOT Place 1 patch onto the skin 2 (two) times a week.   ezetimibe 10 MG tablet Commonly known as: ZETIA TAKE ONE TABLET DAILY   Fenofibric Acid 135 MG Cpdr TAKE ONE CAPSULE EACH DAY   Fish Oil 1000 MG Caps Take 2 capsules by mouth 2 (two) times daily.   HYDROcodone-acetaminophen 5-325 MG tablet Commonly known as: NORCO/VICODIN Take 1 tablet by mouth every 4 (four) hours as needed for severe pain.   levothyroxine 88 MCG tablet Commonly known as: SYNTHROID TAKE ONE TABLET DAILY BEFORE BREAKFAST   loratadine 10 MG tablet Commonly known as: CLARITIN Take 1 tablet (10 mg total) by mouth daily. What changed:  when  to take this reasons to take this   meloxicam 15 MG tablet Commonly known as: MOBIC Take 1 tablet (15 mg total) by mouth daily. What changed:  medication strength how much to take   methocarbamol 500 MG tablet Commonly known as: ROBAXIN Take 1 tablet (500 mg total) by mouth every 6 (six) hours as needed for muscle spasms.   multivitamin per tablet Take 1 tablet by mouth daily.   olmesartan 20 MG tablet Commonly known as: BENICAR TAKE ONE TABLET BY MOUTH ONCE DAILY   oxybutynin 10 MG 24 hr tablet Commonly known as: DITROPAN-XL Take 10 mg by mouth daily.   polyethylene glycol 17 g packet Commonly known as: MIRALAX / GLYCOLAX Take 17 g by mouth daily as needed for mild constipation.   progesterone 100 MG capsule Commonly known as: PROMETRIUM Take 100 mg by mouth See admin instructions. Days 1-10 of every month   spironolactone 25 MG tablet Commonly known as: ALDACTONE TAKE ONE-HALF TABLET DAILY   TURMERIC CURCUMIN PO Take 500 mg by mouth daily.   vitamin C 1000 MG tablet Take 1,000 mg by mouth daily.   Vitamin D (Ergocalciferol) 1.25 MG (50000 UNIT) Caps capsule Commonly known as: DRISDOL Take 50,000 Units by mouth every 30 (thirty) days.   Vitamin D 50 MCG (2000 UT) tablet Take 2,000 Units by mouth daily.   zinc gluconate 50 MG tablet Take 50 mg by mouth daily.   ZzzQuil 50 MG/30ML Liqd Generic drug: diphenhydrAMINE HCl (Sleep) Take 30 mg by mouth at bedtime.               Discharge Care Instructions  (From admission, onward)           Start     Ordered   08/28/21 0000  Change dressing       Comments: Maintain surgical dressing until follow up in the clinic. If the edges start to pull up, may reinforce with tape. If the dressing is no longer working, may remove and cover with gauze and tape, but must keep the area dry and clean.  Call with any questions or concerns.   08/28/21 0827            Diagnostic Studies: DG Pelvis  Portable  Result Date: 08/27/2021 CLINICAL DATA:  Arthroplasty EXAM: PORTABLE PELVIS 1-2 VIEWS COMPARISON:  None Available. FINDINGS: RIGHT total hip arthroplasty. Components appear well seated. Expected soft tissue change. No fracture dislocation. IMPRESSION: No complication following RIGHT hip total arthroplasty. Electronically Signed   By: Suzy Bouchard M.D.   On: 08/27/2021 18:21   DG HIP UNILAT WITH PELVIS 1V RIGHT  Result Date: 08/27/2021 CLINICAL DATA:  Right hip replacement EXAM: DG HIP (WITH OR WITHOUT PELVIS) 1V RIGHT COMPARISON:  No prior radiographs FINDINGS: Five fluoroscopic images are  obtained during the performance of the procedure and are provided for interpretation only. Images demonstrate right total hip arthroplasty. The prosthetic components are in near anatomic alignment. No acute fracture. Fluoroscopy time: 12 seconds 2.7 mGy IMPRESSION: Expected intraoperative appearance of right total hip arthroplasty. Electronically Signed   By: Merilyn Baba M.D.   On: 08/27/2021 17:43   DG C-Arm 1-60 Min-No Report  Result Date: 08/27/2021 Fluoroscopy was utilized by the requesting physician.  No radiographic interpretation.   DG C-Arm 1-60 Min-No Report  Result Date: 08/27/2021 Fluoroscopy was utilized by the requesting physician.  No radiographic interpretation.    Disposition: Discharge disposition: 01-Home or Self Care       Discharge Instructions     Call MD / Call 911   Complete by: As directed    If you experience chest pain or shortness of breath, CALL 911 and be transported to the hospital emergency room.  If you develope a fever above 101 F, pus (white drainage) or increased drainage or redness at the wound, or calf pain, call your surgeon's office.   Change dressing   Complete by: As directed    Maintain surgical dressing until follow up in the clinic. If the edges start to pull up, may reinforce with tape. If the dressing is no longer working, may remove and cover  with gauze and tape, but must keep the area dry and clean.  Call with any questions or concerns.   Constipation Prevention   Complete by: As directed    Drink plenty of fluids.  Prune juice may be helpful.  You may use a stool softener, such as Colace (over the counter) 100 mg twice a day.  Use MiraLax (over the counter) for constipation as needed.   Diet - low sodium heart healthy   Complete by: As directed    Increase activity slowly as tolerated   Complete by: As directed    Weight bearing as tolerated with assist device (walker, cane, etc) as directed, use it as long as suggested by your surgeon or therapist, typically at least 4-6 weeks.   Post-operative opioid taper instructions:   Complete by: As directed    POST-OPERATIVE OPIOID TAPER INSTRUCTIONS: It is important to wean off of your opioid medication as soon as possible. If you do not need pain medication after your surgery it is ok to stop day one. Opioids include: Codeine, Hydrocodone(Norco, Vicodin), Oxycodone(Percocet, oxycontin) and hydromorphone amongst others.  Long term and even short term use of opiods can cause: Increased pain response Dependence Constipation Depression Respiratory depression And more.  Withdrawal symptoms can include Flu like symptoms Nausea, vomiting And more Techniques to manage these symptoms Hydrate well Eat regular healthy meals Stay active Use relaxation techniques(deep breathing, meditating, yoga) Do Not substitute Alcohol to help with tapering If you have been on opioids for less than two weeks and do not have pain than it is ok to stop all together.  Plan to wean off of opioids This plan should start within one week post op of your joint replacement. Maintain the same interval or time between taking each dose and first decrease the dose.  Cut the total daily intake of opioids by one tablet each day Next start to increase the time between doses. The last dose that should be eliminated  is the evening dose.      TED hose   Complete by: As directed    Use stockings (TED hose) for 2 weeks on both leg(s).  You may remove them at night for sleeping.        Follow-up Information     Irving Copas, PA-C. Go on 09/11/2021.   Specialty: Orthopedic Surgery Why: You are scheduled for a follow up appointment on 09-11-21 at 9:00 am. Contact information: [address] [place] [address] [phone]                  Signed: Irving Copas 09/09/2021, 7:51 AM

## 2021-09-24 ENCOUNTER — Encounter (HOSPITAL_BASED_OUTPATIENT_CLINIC_OR_DEPARTMENT_OTHER): Payer: Medicare Other | Admitting: Physical Therapy

## 2021-10-14 ENCOUNTER — Ambulatory Visit (HOSPITAL_COMMUNITY): Payer: Medicare Other | Attending: Physician Assistant

## 2021-10-14 DIAGNOSIS — R011 Cardiac murmur, unspecified: Secondary | ICD-10-CM | POA: Diagnosis present

## 2021-10-14 LAB — ECHOCARDIOGRAM COMPLETE
Area-P 1/2: 3.77 cm2
S' Lateral: 2.9 cm

## 2021-10-23 ENCOUNTER — Encounter (HOSPITAL_BASED_OUTPATIENT_CLINIC_OR_DEPARTMENT_OTHER): Payer: Medicare Other | Admitting: Physical Therapy

## 2021-10-28 ENCOUNTER — Other Ambulatory Visit: Payer: Self-pay | Admitting: Cardiovascular Disease

## 2021-11-04 ENCOUNTER — Encounter: Payer: Self-pay | Admitting: Cardiovascular Disease

## 2021-11-04 ENCOUNTER — Ambulatory Visit: Payer: Medicare Other | Attending: Cardiovascular Disease | Admitting: Cardiovascular Disease

## 2021-11-04 VITALS — BP 132/64 | HR 73 | Ht 64.5 in | Wt 173.0 lb

## 2021-11-04 DIAGNOSIS — I10 Essential (primary) hypertension: Secondary | ICD-10-CM | POA: Diagnosis present

## 2021-11-04 DIAGNOSIS — E663 Overweight: Secondary | ICD-10-CM

## 2021-11-04 DIAGNOSIS — E782 Mixed hyperlipidemia: Secondary | ICD-10-CM

## 2021-11-04 DIAGNOSIS — R7303 Prediabetes: Secondary | ICD-10-CM | POA: Insufficient documentation

## 2021-11-04 DIAGNOSIS — E039 Hypothyroidism, unspecified: Secondary | ICD-10-CM | POA: Diagnosis present

## 2021-11-04 NOTE — Patient Instructions (Signed)
Medication Instructions:  Your physician recommends that you continue on your current medications as directed. Please refer to the Current Medication list given to you today.  *If you need a refill on your cardiac medications before your next appointment, please call your pharmacy*   Follow-Up: At [facility], you and your health needs are our priority.  As part of our continuing mission to provide you with exceptional heart care, we have created designated Provider Care Teams.  These Care Teams include your primary Cardiologist (physician) and Advanced Practice Providers (APPs -  Physician Assistants and Nurse Practitioners) who all work together to provide you with the care you need, when you need it.  We recommend signing up for the patient portal called "MyChart".  Sign up information is provided on this After Visit Summary.  MyChart is used to connect with patients for Virtual Visits (Telemedicine).  Patients are able to view lab/test results, encounter notes, upcoming appointments, etc.  Non-urgent messages can be sent to your provider as well.   To learn more about what you can do with MyChart, go to [email or web address].    Your next appointment:   12 month(s)  The format for your next appointment:   In Person  Provider:   Thomas Kelly, MD    

## 2021-11-04 NOTE — Progress Notes (Signed)
Cardiology Office Note    Date:  11/04/2021   ID:  Megan Savage, DOB 02-18-1955, MRN [id number]  PCP:  Megan Baton, MD  Cardiologist:  Megan Majestic, MD   18 month follow-up office visit  History of Present Illness:  Megan Savage is a 66 y.o. female who is followed by Megan Savage for primary care.  I saw her for initial evaluation in August 2019 when she was self-referred for difficult control hypertension and cardiovascular risk assessment.  She presents for a 16 month follow-up evaluation.  .  Megan Savage is well known to me.  She remotely had been seen by Megan Savage in 2012 and at that time had mild hypertension, hypohyroidism, obesity, and hyperlipidemia.  She was on valsartan 80 mg, levothyroxine 25 mcg, and omega-3 fatty acid.  She lost a significant amount of weight approximately 4 years ago and it and with a 36 pound weight loss her blood pressure improved and apparently she was taken off medication.  She had been started back on blood pressure medication in February 2019 and due to the valsartan recall was initially started on losartan.  She is now on 100 mg daily.  Bite this, she has noticed that her blood pressures have been consistently elevated.  She has regained some of her weight back.  On July 17, 2017 undergone a CT calcium score was 0.  She was incidentally found to have a right lower lobe noncalcified pulmonary nodule.  She has subsequently been evaluated by Megan Savage at [facility] with chief of thoracic surgery who she has served with on the cancer board at [facility].  She has a family history of heart issues and grandparents and her mother dying at 39 secondary to arrhythmia.  When I initially saw her she denied any chest pain, PND orthopnea.  She denied any awareness of sleep apnea but sleeps by herself and was unaware if she snores.   When I initially saw her, she was significantly hypertensive with blood pressure 170/88 without orthostatic change.  At that time, I  recommended discontinuing losartan and in its place substituted this for olmesartan 40 mg which should be more potent.  I also recommended the addition of Spironolactone 12.5 mg.  She underwent an echo Doppler study which demonstrated mild concentric LVH with normal EF at 60 to 65% without regional wall motion abnormalities.  There was grade 1 diastolic dysfunction and tissue Doppler was consistent with high ventricular filling pressures.  There was mild left atrial dilation.  Because of her somewhat accelerated hypertension I recommended a renal artery duplex evaluation.  This demonstrated mild to less than 59% bilateral narrowing of the right and left renal arteries with abnormal resistive index.  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  She denies any abdominal symptoms.  When I saw her I reviewed data with reference to the clinical atherosclerosis.  We discussed diet and her laboratory in May which had shown a total cholesterol of 206, HDL 34, LDL 118, and triglycerides of 271.   When I saw her in the office In October 2019 her blood pressure had improved on her increased medical regimen of olmesartan 40 mg and spironolactone 12.5 mg.  I recommended initiation of rosuvastatin 10 mg every other day for 2 weeks and if tolerated to try to increase this to 10 mg daily.    With the COVID-19 pandemic , she became more sedentary  and essentially stayed home.  Initially she was drinking more wine than normally.  I saw her for a telemedicine visit on May 24, 2018.  She could not tolerate the statin and had been started on Zetia 10 mg.  She underwent repeat laboratory prior to that telemedicine visit on May 12, 2018 and her total cholesterol increased to 227, triglycerides 914, HDL 27.  Remotely she recalled back in 2015 her triglycerides on 1 occasion had increased to 1305.  During that evaluation her blood pressure was elevated but she had received a hip injection  earlier that morning.  Subsequently, she enrolled in weight watchers.  She dramatically changed her diet and significantly reduced her wine consumption, not drinking at all during the week and only minimally on the weekends.   When I saw her on July 29, 2018 with the above changes to her regimen and with dietary adherence to weight watchers she had lost 22 pounds since May 24, 2018.  Her blood pressure has significantly improved and her dose of olmesartan was reduced due to her calling and stating her blood pressure was getting low.  She underwent recent repeat laboratory which was dramatically improved with total cholesterol improving from 2 27-1 18, triglycerides from 914 to 83, HDL increasing from 27 to 42, and her LDL cholesterol improved to 59.  She feels great.    I saw her in December 2020 and over the previous 5 months she had continued with her diet .  She has been using weight watchers.  She has been walking at least 3 miles every day.  She no longer drinks wine during the week except on weekends.  She notes more energy.  She denies chest pain PND orthopnea.  She is unaware of palpitations.  She underwent right shoulder surgery replacement on September 17, 2018 and tolerated this well from a cardiovascular standpoint.  She did undergo physical therapy.  Repeat laboratory on December 28, 2018 showed a total cholesterol of 130, HDL 56, LDL 63, and triglycerides were now down to 54.  She feels great.  She is able to use some of her old wardrobe that she has not used in years and she has lost a total of 44 pounds since April.  Her weight at home today was 137 with a peak weight in April at 181.  Of note in 2015 she weighed 198 pounds.  She has had a recent telemedicine visit with Dr. Virgina Savage.  At that time, she was on a reduced dose of olmesartan at 20 mg daily and continue to take spironolactone 12.5 mg.  On this regimen, her blood pressure remained optimal at 125/70.  I last saw her on May 08, 2020.  She  retired at the end of December 2021.  She had developed some eye issues and was found to have a total of 5 retinal tears and underwent laser treatment by Dr. Gala Murdoch and at [facility] retinal specialist.  As result she has not been able to do any activity for a minimum of 4 weeks.  Recently, she has been going out to dinner more with friends.  She has noticed her weight has increased as well as her blood pressure.  She has been on olmesartan at the reduced dose of only 10 mg and apparently 6 months ago she ran out of spironolactone.  She has had some issues with cough and had seen Dr. Asencion Noble who recommended Claritin, and Flonase.  She denied chest pain and was unaware of palpitations.  During that evaluation, her blood pressure was elevated at 162/78 and I recommended further increasing olmesartan back to 20 mg daily and resume spironolactone 12.5 mg.  She was evaluated by Fabian Sharp August 05, 2021 and was seen preoperatively prior to undergoing hip replacement.  She underwent successful right hip surgery in August 27, 2021 via the anterior approach.  She tolerated the procedure well from a cardiovascular standpoint.  Following rehabilitation, she has done remarkably well.  She is now more active.  She continues to be on olmesartan 20 mg and spironolactone 12.5 mg daily for blood pressure control.  She is on Zetia 10 mg fenofibric acid 135 mg and omega-3 fatty acid 2 capsules twice a day for her mixed hyperlipidemia.  Dr. Virgina Savage had check laboratory in June 2023.  Chemistry and CBC were stable.  Apolipoprotein B was 83.  TSH was normal at 2.06.  Hemoglobin A1c was mildly increased at 6.1.  Lipid studies revealed total cholesterol 169 triglycerides 140 HDL 46 and LDL 95.  She previously had a calcium score of 0.  She presents for follow-up evaluation.  Past Medical History:  Diagnosis Date   Anemia    Arthritis    Bronchitis    Depression    denies, borther passed away   Fatigue    resolved    Heart  murmur    Hypercholesterolemia    Hyperlipidemia    controlled    Hypertension    controlled   Hypothyroidism    Insomnia    Obesity    Skin disorder    rosacea    Past Surgical History:  Procedure Laterality Date   APPENDECTOMY  1976   BREAST IMPLANT REMOVAL Bilateral    implanted 1996 removed 2002   CARPAL TUNNEL RELEASE     bilateral    ENDOMETRIAL FULGURATION  1992   foot surgery      tumor removed (left)    HYSTEROSCOPY WITH D & C N/A 06/15/2018   Procedure: DILATATION AND CURETTAGE /HYSTEROSCOPY;  Surgeon: Dian Queen, MD;  Location: [place];  Service: Gynecology;  Laterality: N/A;   hysterscopy      OOPHORECTOMY  1976   left ovary removed    TOTAL HIP ARTHROPLASTY Right 08/27/2021   Procedure: TOTAL HIP ARTHROPLASTY ANTERIOR APPROACH;  Surgeon: Paralee Cancel, MD;  Location: [facility];  Service: Orthopedics;  Laterality: Right;   TOTAL SHOULDER ARTHROPLASTY Right 09/17/2018   Procedure: TOTAL SHOULDER ARTHROPLASTY anatomical;  Surgeon: Netta Cedars, MD;  Location: [facility];  Service: Orthopedics;  Laterality: Right;    Current Medications: Outpatient Medications Prior to Visit  Medication Sig Dispense Refill   Ascorbic Acid (VITAMIN C) 1000 MG tablet Take 1,000 mg by mouth daily.     azelastine (ASTELIN) 0.1 % nasal spray Place 2 sprays into both nostrils 2 (two) times daily. Use in each nostril as directed (Patient taking differently: Place 2 sprays into both nostrils daily as needed for allergies. Use in each nostril as directed) 30 mL 1   budesonide-formoterol (SYMBICORT) 160-4.5 MCG/ACT inhaler Inhale 2 puffs into the lungs in the morning and at bedtime. (Patient taking differently: Inhale 2 puffs into the lungs daily as needed (Cough).) 1 each 6   CALCIUM CITRATE-VITAMIN D PO Take 2 tablets by mouth 2 (two) times daily.  500 mg / 400 mg     Cholecalciferol (VITAMIN D) 50 MCG (2000 UT) tablet Take 2,000 Units by mouth daily.     Choline Fenofibrate  (FENOFIBRIC ACID)  135 MG CPDR TAKE ONE CAPSULE EACH DAY 90 capsule 2   diphenhydrAMINE HCl, Sleep, (ZZZQUIL) 50 MG/30ML LIQD Take 30 mg by mouth at bedtime.     estradiol (VIVELLE-DOT) 0.025 MG/24HR Place 1 patch onto the skin 2 (two) times a week.       ezetimibe (ZETIA) 10 MG tablet TAKE ONE TABLET DAILY 90 tablet 3   levothyroxine (SYNTHROID) 88 MCG tablet TAKE ONE TABLET DAILY BEFORE BREAKFAST 90 tablet 2   loratadine (CLARITIN) 10 MG tablet Take 1 tablet (10 mg total) by mouth daily. (Patient taking differently: Take 10 mg by mouth daily as needed for allergies.) 90 tablet 3   multivitamin (THERAGRAN) per tablet Take 1 tablet by mouth daily.       olmesartan (BENICAR) 20 MG tablet TAKE ONE TABLET BY MOUTH ONCE DAILY 90 tablet 0   Omega-3 Fatty Acids (FISH OIL) 1000 MG CAPS Take 2 capsules by mouth 2 (two) times daily.     oxybutynin (DITROPAN-XL) 10 MG 24 hr tablet Take 10 mg by mouth daily.     progesterone (PROMETRIUM) 100 MG capsule Take 100 mg by mouth See admin instructions. Days 1-10 of every month     spironolactone (ALDACTONE) 25 MG tablet TAKE ONE-HALF TABLET DAILY 45 tablet 1   TURMERIC CURCUMIN PO Take 500 mg by mouth daily.     Vitamin D, Ergocalciferol, (DRISDOL) 50000 units CAPS capsule Take 50,000 Units by mouth every 30 (thirty) days.      zinc gluconate 50 MG tablet Take 50 mg by mouth daily.     docusate sodium (COLACE) 100 MG capsule Take 1 capsule (100 mg total) by mouth 2 (two) times daily. 10 capsule 0   HYDROcodone-acetaminophen (NORCO/VICODIN) 5-325 MG tablet Take 1 tablet by mouth every 4 (four) hours as needed for severe pain. 20 tablet 0   methocarbamol (ROBAXIN) 500 MG tablet Take 1 tablet (500 mg total) by mouth every 6 (six) hours as needed for muscle spasms. 40 tablet 0   polyethylene glycol (MIRALAX / GLYCOLAX) 17 g packet Take 17 g by mouth daily as needed for mild constipation. 14 each 0   No facility-administered medications prior to visit.      Allergies:   Cat hair extract   Social History   Socioeconomic History   Marital status: Divorced    Spouse name: Not on file   Number of children: Not on file   Years of education: Not on file   Highest education level: Not on file  Occupational History   Occupation: Astronomer: SELF-EMPLOYED  Tobacco Use   Smoking status: Never   Smokeless tobacco: Never  Vaping Use   Vaping Use: Never used  Substance and Sexual Activity   Alcohol use: Yes    Comment: 2-3 glasses wine daily , 1 drink each weekend    Drug use: Never   Sexual activity: Not on file  Other Topics Concern   Not on file  Social History Narrative   Not on file   Social Determinants of Health   Financial Resource Strain: Not on file  Food Insecurity: Not on file  Transportation Needs: Not on file  Physical Activity: Not on file  Stress: Not on file  Social Connections: Not on file    Additional social history is that she has been very active in the community.  She serves on the cancer board at [facility].  She had worked in [employer] for 12 years from [phone].  She has worked in Pharmacologist.  She was involved with politics had worked for the [employer].  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  Family History:  The patient's family history includes Arrhythmia in her mother; Asthma in her brother, mother, and paternal aunt; Cancer in her brother and father; Emphysema in her mother.   Her father died at age 16 and was a physician.  He had laryngeal CA.  Mother died at 59 secondary to arrhythmias.  Her brother died at age 32 secondary to ALL and T-cell lymphoma.  ROS General: Negative; No fevers, chills, or night sweats;  HEENT: Negative; No changes in vision or hearing, sinus congestion, difficulty swallowing Pulmonary: Recent cough, resolved Cardiovascular: Negative; No  chest pain, presyncope, syncope, palpitations GI: Negative; No nausea, vomiting, diarrhea, or abdominal pain GU: Negative; No dysuria, hematuria, or difficulty voiding Musculoskeletal: Status post right shoulders replacement September 17, 2018; right hip surgery August 27, 2021. Hematologic/Oncology: Negative; no easy bruising, bleeding Endocrine: Negative; no heat/cold intolerance; no diabetes Neuro: Negative; no changes in balance, headaches Skin: Negative; No rashes or skin lesions Psychiatric: Negative; No behavioral problems, depression Sleep: Negative; No snoring, daytime sleepiness, hypersomnolence, bruxism, restless legs, hypnogognic hallucinations, no cataplexy Other comprehensive 14 point system review is negative.   PHYSICAL EXAM:   VS:  BP 132/64 (BP Location: Left Arm, Patient Position: Sitting)   Pulse 73   Ht 5' 4.5" (1.638 m)   Wt 173 lb (78.5 kg)   SpO2 99%   BMI 29.24 kg/m     Repeat blood pressure by me remained elevated at 160/80 repeat blood pressure by me was 130/66. Wt Readings from Last 3 Encounters:  11/04/21 173 lb (78.5 kg)  08/27/21 181 lb (82.1 kg)  08/14/21 169 lb 3.2 oz (76.7 kg)    General: Alert, oriented, no distress.  Skin: normal turgor, no rashes, warm and dry HEENT: Normocephalic, atraumatic. Pupils equal round and reactive to light; sclera anicteric; extraocular muscles intact;  Nose without nasal septal hypertrophy Mouth/Parynx benign; Mallinpatti scale 3 Neck: No JVD, no carotid bruits; normal carotid upstroke Lungs: clear to ausculatation and percussion; no wheezing or rales Chest wall: without tenderness to palpitation Heart: PMI not displaced, RRR, s1 s2 normal, 1/6 systolic murmur, no diastolic murmur, no rubs, gallops, thrills, or heaves Abdomen: soft, nontender; no hepatosplenomehaly, BS+; abdominal aorta nontender and not dilated by palpation. Back: no CVA tenderness Pulses 2+ Musculoskeletal: full range of motion, normal strength,  no joint deformities Extremities: no clubbing cyanosis or edema, Homan's sign negative  Neurologic: grossly nonfocal; Cranial nerves grossly wnl Psychologic: Normal mood and affect     Studies/Labs Reviewed:    October, 9, 2023 ECG (independently read by me): NSR at 73, PRWP, no ectopy, normal interval  May 08, 2020 ECG (independently read by me): NSR at 73, LAHB  January 18, 2019 ECG (independently read by me): Normal sinus rhythm at 78 bpm, left axis deviation.  Poor anterior R wave progression.  Normal intervals.  No ectopy.  July 2020 EKG:  EKG is  ordered today.  ECG (independently read by me): Normal sinus rhythm at 74 bpm.  Borderline first-degree AV block with minimal to 202 ms.  QTc interval normal at 437 ms.  October 2019 ECG (independently read by me): Sinus rhythm 84 bpm.  Borderline first-degree AV block with a PR interval 206 ms.  Mild left axis deviation.  No ectopy.  August 31, 2017 ECG (independently read by me): Sinus rhythm at 76 bpm.  Right superior axis.  Normal intervals.  No ectopy.  No ST segment changes.  Recent Labs:    Latest Ref Rng & Units 08/28/2021    2:21 AM 08/14/2021    8:45 AM 05/16/2020    7:22 AM  BMP  Glucose 70 - 99 mg/dL 191  136  127   BUN 8 - 23 mg/dL '11  20  16   ' Creatinine 0.44 - 1.00 mg/dL 0.44  0.46  0.59   BUN/Creat Ratio 12 - 28   27   Sodium 135 - 145 mmol/L 135  133  139   Potassium 3.5 - 5.1 mmol/L 4.1  4.0  4.6   Chloride 98 - 111 mmol/L 105  101  102   CO2 22 - 32 mmol/L '22  24  22   ' Calcium 8.9 - 10.3 mg/dL 8.7  9.6  9.3         Latest Ref Rng & Units 05/16/2020    7:22 AM 07/22/2018    9:00 AM 05/12/2018    8:32 AM  Hepatic Function  Total Protein 6.0 - 8.5 g/dL 7.1  6.4  7.0   Albumin 3.8 - 4.8 g/dL 4.5  4.7  4.6   AST 0 - 40 IU/L '18  17  22   ' ALT 0 - 32 IU/L '24  19  31   ' Alk Phosphatase 44 - 121 IU/L 86  38  84   Total Bilirubin 0.0 - 1.2 mg/dL 0.3  0.5  0.3        Latest Ref Rng & Units 08/28/2021    2:21 AM  08/14/2021    8:45 AM 05/16/2020    7:22 AM  CBC  WBC 4.0 - 10.5 K/uL 8.0  4.6  4.7   Hemoglobin 12.0 - 15.0 g/dL 11.3  13.0  13.3   Hematocrit 36.0 - 46.0 % 33.8  38.8  40.0   Platelets 150 - 400 K/uL 249  299  272    Lab Results  Component Value Date   MCV 90.1 08/28/2021   MCV 89.2 08/14/2021   MCV 89 05/16/2020   Lab Results  Component Value Date   TSH 6.210 (H) 05/16/2020   No results found for: "HGBA1C"   BNP No results found for: "BNP"  ProBNP No results found for: "PROBNP"   Lipid Panel     Component Value Date/Time   CHOL 162 05/16/2020 0722   TRIG 242 (H) 05/16/2020 0722   HDL 41 05/16/2020 0722   CHOLHDL 4.0 05/16/2020 0722   CHOLHDL 4 06/21/2010 0811   VLDL 36.4 06/21/2010 0811   LDLCALC 81 05/16/2020 0722     RADIOLOGY: ECHOCARDIOGRAM COMPLETE  Result Date: 10/14/2021    ECHOCARDIOGRAM REPORT   Patient Name:   OTHELIA RIEDERER Date of Exam: 10/14/2021 Medical Rec #:  [id number]     Height:       64.0 in Accession #:    [id number]    Weight:       181.0 lb Date of Birth:  1955/02/06     BSA:          1.875 m Patient Age:    40 years      BP:           165/76 mmHg Patient Gender: F             HR:  83 bpm. Exam Location:  [address] Procedure: 2D Echo, Color Doppler, Cardiac Doppler and 3D Echo Indications:    Murmur R01.1  History:        Patient has prior history of Echocardiogram examinations, most                 recent 09/03/2017. Risk Factors:Dyslipidemia and Hypertension.  Sonographer:    Mikki Santee RDCS Referring Phys: Tami Lin [facility]  Sonographer Comments: Global longitudinal strain was attempted. IMPRESSIONS  1. Left ventricular ejection fraction, by estimation, is 60 to 65%. The left ventricle has normal function. The left ventricle has no regional wall motion abnormalities. Left ventricular diastolic parameters were normal.  2. Right ventricular systolic function is normal. The right ventricular size is normal.  3. The mitral valve is  abnormal. Trivial mitral valve regurgitation. No evidence of mitral stenosis. Moderate mitral annular calcification.  4. The aortic valve is tricuspid. Aortic valve regurgitation is not visualized. No aortic stenosis is present.  5. The inferior vena cava is normal in size with greater than 50% respiratory variability, suggesting right atrial pressure of 3 mmHg. FINDINGS  Left Ventricle: Left ventricular ejection fraction, by estimation, is 60 to 65%. The left ventricle has normal function. The left ventricle has no regional wall motion abnormalities. The left ventricular internal cavity size was normal in size. There is  no left ventricular hypertrophy. Left ventricular diastolic parameters were normal. Right Ventricle: The right ventricular size is normal. No increase in right ventricular wall thickness. Right ventricular systolic function is normal. Left Atrium: Left atrial size was normal in size. Right Atrium: Right atrial size was normal in size. Pericardium: There is no evidence of pericardial effusion. Mitral Valve: The mitral valve is abnormal. There is mild thickening of the mitral valve leaflet(s). There is mild calcification of the mitral valve leaflet(s). Moderate mitral annular calcification. Trivial mitral valve regurgitation. No evidence of mitral valve stenosis. Tricuspid Valve: The tricuspid valve is normal in structure. Tricuspid valve regurgitation is not demonstrated. No evidence of tricuspid stenosis. Aortic Valve: The aortic valve is tricuspid. Aortic valve regurgitation is not visualized. No aortic stenosis is present. Pulmonic Valve: The pulmonic valve was normal in structure. Pulmonic valve regurgitation is not visualized. No evidence of pulmonic stenosis. Aorta: The aortic root is normal in size and structure. Venous: The inferior vena cava is normal in size with greater than 50% respiratory variability, suggesting right atrial pressure of 3 mmHg. IAS/Shunts: No atrial level shunt detected  by color flow Doppler.  LEFT VENTRICLE PLAX 2D LVIDd:         4.50 cm   Diastology LVIDs:         2.90 cm   LV e' medial:    7.62 cm/s LV PW:         1.10 cm   LV E/e' medial:  13.6 LV IVS:        1.10 cm   LV e' lateral:   7.29 cm/s LVOT diam:     2.00 cm   LV E/e' lateral: 14.3 LV SV:         78 LV SV Index:   42 LVOT Area:     3.14 cm                           3D Volume EF:  3D EF:        62 %                          LV EDV:       103 ml                          LV ESV:       39 ml                          LV SV:        64 ml RIGHT VENTRICLE RV Basal diam:  3.70 cm RV Mid diam:    3.20 cm RV S prime:     18.40 cm/s TAPSE (M-mode): 2.6 cm LEFT ATRIUM             Index        RIGHT ATRIUM           Index LA diam:        3.70 cm 1.97 cm/m   RA Area:     18.50 cm LA Vol (A2C):   72.1 ml 38.45 ml/m  RA Volume:   49.40 ml  26.35 ml/m LA Vol (A4C):   92.5 ml 49.33 ml/m LA Biplane Vol: 84.8 ml 45.22 ml/m  AORTIC VALVE LVOT Vmax:   123.00 cm/s LVOT Vmean:  83.300 cm/s LVOT VTI:    0.248 m  AORTA Ao Root diam: 2.70 cm Ao Asc diam:  3.50 cm MITRAL VALVE MV Area (PHT): 3.77 cm     SHUNTS MV Decel Time: 201 msec     Systemic VTI:  0.25 m MV E velocity: 104.00 cm/s  Systemic Diam: 2.00 cm MV A velocity: 125.00 cm/s MV E/A ratio:  0.83 Jenkins Rouge MD Electronically signed by Jenkins Rouge MD Signature Date/Time: 10/14/2021/2:16:32 PM    Final       Additional studies/ records that were reviewed today include:  I reviewed her remote record from Megan Savage in 2012.  I also reviewed her initial consultation at [facility] on July 28, 2017 by Megan Savage.  Reviewed her CT calcium score; echo Doppler study, renal duplex scan.  Recent laboratory from December 28, 2018 was reviewed.  Total cholesterol 130, HDL 56, LDL 63, triglycerides 54.  Hemoglobin A1c on July 27, 2018 was 5.1.  Serum creatinine 0.6 on December 28, 2018.  TSH 1.72.  ASSESSMENT:    No diagnosis found.  PLAN:   1.  Essential  hypertension: She has a history of hypertension dating back to at least 2012 at which time medical therapy was initiated.  She had stopped medication for several years after she had lost significant weight.  I had reinstituted medication and in April 2020  telemedicine visit blood pressure was still increased on olmesartan 40 mg and spironolactone 12.5 mg daily.  Since that evaluation with her significant weight loss, reduction in alcohol intake as well as increased activity her blood pressure when last evaluated in December 2020, blood pressure was excellent on a reduced dose of olmesartan 10 mg and spironolactone 12.5 mg.  When last evaluated by me April 2022 blood pressure was elevated and she was advised to further increase to 20 mg daily and continue with the spironolactone 12.5 mg.  Blood pressure today is excellent and on repeat by me was 130/66.  2.  Mixed hyperlipidemia: Historically she had an  episode of marked hypertriglyceridemia in 2015 with levels at 1305.  With the initiation of the COVID pandemic and stay at home dietary compliance was significantly less and there was consumption of increased wine compared to previously.  This resulted in significant increase in triglycerides to 914.  When I saw her in December 2020, since May 24, 2018 she had lost 44 pounds and was on a diet her diet with  fish 4 times per week, absence of wine during the week, regular exercise, and avoidance of sugars and sweets as well as doing weight watchers.  Subsequent laboratory showed marked benefit and lipids were excellent with triglyceride at 54, LDL 63, HDL increasing to 56 and LDL at 130.  She has been on therapy with fenofibrate 145 mg, Vascepa 2 capsules twice a day in addition to Zetia 10 mg daily.  Most recent laboratory done by Dr. Virgina Savage in June 2023 showed total cholesterol 169, triglycerides 140, HDL 46, LDL 95.  Apolipoprotein B was 83.  She is no longer on Vascepa since this was not covered by insurance but  she continues to take omega-3 fatty acid.  Calcium score 0 in June 2019.  3.  Status post right hip surgery August 27, 2021.  This was done via the anterior approach.  She had a torn labrum.  4.  Retinal detachment.  She was found to have retinal detachment when seen by Dr. Elmer Sow in place of Dr. Kathrin Penner and was referred to [place] retinal specialist and received laser treatment for 5 retinal tears.    5.  Weight fluctuation: She had weight fluctuation over the years.  She had purposeful weight loss totaling 44 pounds from April 2020 until December 2020 when her weight decreased to a low of 137 pounds from a peak weight of 181.  Current weight is stable at 173 with BMI 29.24.  6. Hypothyroidism: Most recent TSH was 2.06 in June 2023 on levothyroxine 88 mcg.  7.  Hemoglobin A1c 6.1 consistent with prediabetes.  Followed by Dr. Virgina Savage  8.  Pulmonary nodule: Followed by Megan Savage at [facility].  This has been stable.  9.  Status post right shoulder replacement September 17, 2018; completed physical therapy.  As long as she is stable I will see her in 1 year for reevaluation or sooner as needed.  Medication Adjustments/Labs and Tests Ordered: Current medicines are reviewed at length with the patient today.  Concerns regarding medicines are outlined above.  Medication changes, Labs and Tests ordered today are listed in the Patient Instructions below. There are no Patient Instructions on file for this visit.   Signed, Megan Majestic, MD  11/04/2021 4:25 PM    [facility] [address], [place], [place], [place]  [address] Phone: [phone]  At [facility] retinal specialist who found 5 retinal tears and she underwent laser treatment.  She has been an active and unable to exercise for the past 4 weeks resulting in

## 2021-11-10 ENCOUNTER — Encounter: Payer: Self-pay | Admitting: Cardiovascular Disease

## 2021-11-20 ENCOUNTER — Other Ambulatory Visit: Payer: Self-pay | Admitting: Cardiovascular Disease

## 2021-12-04 ENCOUNTER — Other Ambulatory Visit: Payer: Self-pay | Admitting: Obstetrics and Gynecology

## 2021-12-04 DIAGNOSIS — Z1231 Encounter for screening mammogram for malignant neoplasm of breast: Secondary | ICD-10-CM

## 2021-12-25 ENCOUNTER — Other Ambulatory Visit: Payer: Self-pay | Admitting: Orthopedic Surgery

## 2021-12-25 DIAGNOSIS — M25551 Pain in right hip: Secondary | ICD-10-CM

## 2021-12-25 DIAGNOSIS — M25562 Pain in left knee: Secondary | ICD-10-CM

## 2021-12-26 ENCOUNTER — Other Ambulatory Visit: Payer: Self-pay | Admitting: Orthopedic Surgery

## 2021-12-26 ENCOUNTER — Encounter: Payer: Self-pay | Admitting: Orthopedic Surgery

## 2021-12-26 ENCOUNTER — Ambulatory Visit
Admission: RE | Admit: 2021-12-26 | Discharge: 2021-12-26 | Disposition: A | Discharge status: Home / Self Care | Payer: Medicare Other | Source: Ambulatory Visit | Attending: Orthopedic Surgery | Admitting: Orthopedic Surgery

## 2021-12-26 DIAGNOSIS — M25562 Pain in left knee: Secondary | ICD-10-CM

## 2021-12-26 DIAGNOSIS — M25551 Pain in right hip: Secondary | ICD-10-CM

## 2022-01-06 ENCOUNTER — Encounter: Payer: Self-pay | Admitting: Cardiovascular Disease

## 2022-01-08 ENCOUNTER — Telehealth: Payer: Self-pay

## 2022-01-08 NOTE — Telephone Encounter (Signed)
   Pre-operative Risk Assessment    Patient Name: Megan Savage  DOB: 1955/03/02 MRN: [id number]      Request for Surgical Clearance    Procedure:   LT TOTAL KNEE ARTHROPLASTY  Date of Surgery:  Clearance 02/18/22                                 Surgeon:  DR Paralee Cancel  ATTN:SHERRY WILLIS Surgeon's Group or Practice Name:  [facility]  Phone number:  [phone] Fax number:  [phone]    Type of Clearance Requested:   - Medical    Type of Anesthesia:  Spinal   Additional requests/questions:    Altamese Cabal   01/08/2022, 4:29 PM

## 2022-01-09 ENCOUNTER — Telehealth: Payer: Self-pay

## 2022-01-09 NOTE — Telephone Encounter (Signed)
Patient scheduled for tele visit on 01/31/22. Med rec and consent done

## 2022-01-09 NOTE — Telephone Encounter (Signed)
  Patient Consent for Virtual Visit         Megan Savage has provided verbal consent on 01/09/2022 for a virtual visit (video or telephone).   CONSENT FOR VIRTUAL VISIT FOR:  Megan Savage  By participating in this virtual visit I agree to the following:  I hereby voluntarily request, consent and authorize [place] and its employed or contracted physicians, physician assistants, nurse practitioners or other licensed health care professionals (the Practitioner), to provide me with telemedicine health care services (the "Services") as deemed necessary by the treating Practitioner. I acknowledge and consent to receive the Services by the Practitioner via telemedicine. I understand that the telemedicine visit will involve communicating with the Practitioner through live audiovisual communication technology and the disclosure of certain medical information by electronic transmission. I acknowledge that I have been given the opportunity to request an in-person assessment or other available alternative prior to the telemedicine visit and am voluntarily participating in the telemedicine visit.  I understand that I have the right to withhold or withdraw my consent to the use of telemedicine in the course of my care at any time, without affecting my right to future care or treatment, and that the Practitioner or I may terminate the telemedicine visit at any time. I understand that I have the right to inspect all information obtained and/or recorded in the course of the telemedicine visit and may receive copies of available information for a reasonable fee.  I understand that some of the potential risks of receiving the Services via telemedicine include:  Delay or interruption in medical evaluation due to technological equipment failure or disruption; Information transmitted may not be sufficient (e.g. poor resolution of images) to allow for appropriate medical decision making by the Practitioner;  and/or  In rare instances, security protocols could fail, causing a breach of personal health information.  Furthermore, I acknowledge that it is my responsibility to provide information about my medical history, conditions and care that is complete and accurate to the best of my ability. I acknowledge that Practitioner's advice, recommendations, and/or decision may be based on factors not within their control, such as incomplete or inaccurate data provided by me or distortions of diagnostic images or specimens that may result from electronic transmissions. I understand that the practice of medicine is not an exact science and that Practitioner makes no warranties or guarantees regarding treatment outcomes. I acknowledge that a copy of this consent can be made available to me via my patient portal ([place]), or I can request a printed copy by calling the office of [place].    I understand that my insurance will be billed for this visit.   I have read or had this consent read to me. I understand the contents of this consent, which adequately explains the benefits and risks of the Services being provided via telemedicine.  I have been provided ample opportunity to ask questions regarding this consent and the Services and have had my questions answered to my satisfaction. I give my informed consent for the services to be provided through the use of telemedicine in my medical care

## 2022-01-09 NOTE — Telephone Encounter (Signed)
Primary Cardiologist:Thomas Claiborne Billings, MD   Preoperative team, please contact this patient and set up a phone call appointment for further preoperative risk assessment. Please obtain consent and complete medication review. Thank you for your help.   I confirm that guidance regarding antiplatelet and oral anticoagulation therapy has been completed and, if necessary, noted below (none requested).   Emmaline Life, NP-C  01/09/2022, 12:34 PM [address] Office [phone] Fax [phone]

## 2022-01-09 NOTE — Telephone Encounter (Signed)
1st attempt to reach pt to schedule tele visit. Lvm  

## 2022-01-23 ENCOUNTER — Other Ambulatory Visit: Payer: Self-pay | Admitting: Cardiovascular Disease

## 2022-01-31 ENCOUNTER — Encounter: Payer: Self-pay | Admitting: Nurse Practitioner

## 2022-01-31 ENCOUNTER — Ambulatory Visit: Payer: Medicare Other | Attending: Cardiology | Admitting: Nurse Practitioner

## 2022-01-31 DIAGNOSIS — Z01810 Encounter for preprocedural cardiovascular examination: Secondary | ICD-10-CM

## 2022-01-31 NOTE — Progress Notes (Signed)
Virtual Visit via Telephone Note   Because of Megan Savage's co-morbid illnesses, she is at least at moderate risk for complications without adequate follow up.  This format is felt to be most appropriate for this patient at this time.  The patient did not have access to video technology/had technical difficulties with video requiring transitioning to audio format only (telephone).  All issues noted in this document were discussed and addressed.  No physical exam could be performed with this format.  Please refer to the patient's chart for her consent to telehealth for [facility].  Evaluation Performed:  Preoperative cardiovascular risk assessment _____________   Date:  01/31/2022   Patient ID:  Megan Savage, DOB 1955-04-01, MRN [id number] Patient Location:  Home Provider location:   Office  Primary Care Provider:  Shon Baton, MD Primary Cardiologist:  Shelva Majestic, MD  Chief Complaint / Patient Profile   67 y.o. y/o female with a h/o cardiac murmur, hypertension, hyperlipidemia who is pending left total knee arthroplasty on 02/18/2022 with Dr. Paralee Cancel of [facility] and presents today for telephonic preoperative cardiovascular risk assessment.  History of Present Illness    Megan Savage is a 67 y.o. female who presents via audio/video conferencing for a telehealth visit today.  Pt was last seen in cardiology clinic on 08/05/2021 by Fabian Savage, [place].  At that time Megan Savage was doing well. The patient is now pending procedure as outlined above. Since her last visit, she has done well from a cardiac standpoint.   She denies chest pain, palpitations, dyspnea, pnd, orthopnea, n, v, dizziness, syncope, edema, weight gain, or early satiety. All other systems reviewed and are otherwise negative except as noted above.   Past Medical History    Past Medical History:  Diagnosis Date   Anemia    Arthritis    Bronchitis    Depression    denies, borther passed away    Fatigue    resolved    Heart murmur    Hypercholesterolemia    Hyperlipidemia    controlled    Hypertension    controlled   Hypothyroidism    Insomnia    Obesity    Skin disorder    rosacea   Past Surgical History:  Procedure Laterality Date   APPENDECTOMY  1976   BREAST IMPLANT REMOVAL Bilateral    implanted 1996 removed 2002   CARPAL TUNNEL RELEASE     bilateral    ENDOMETRIAL FULGURATION  1992   foot surgery      tumor removed (left)    HYSTEROSCOPY WITH D & C N/A 06/15/2018   Procedure: DILATATION AND CURETTAGE /HYSTEROSCOPY;  Surgeon: Dian Queen, MD;  Location: [facility] [facility];  Service: Gynecology;  Laterality: N/A;   hysterscopy      OOPHORECTOMY  1976   left ovary removed    TOTAL HIP ARTHROPLASTY Right 08/27/2021   Procedure: TOTAL HIP ARTHROPLASTY ANTERIOR APPROACH;  Surgeon: Paralee Cancel, MD;  Location: [facility];  Service: Orthopedics;  Laterality: Right;   TOTAL SHOULDER ARTHROPLASTY Right 09/17/2018   Procedure: TOTAL SHOULDER ARTHROPLASTY anatomical;  Surgeon: Netta Cedars, MD;  Location: [facility];  Service: Orthopedics;  Laterality: Right;    Allergies  Allergies  Allergen Reactions   Cat Hair Extract Other (See Comments)    Watery eyes    Home Medications    Prior to Admission medications   Medication Sig Start Date End Date Taking? Authorizing Provider  Ascorbic Acid (VITAMIN  C) 1000 MG tablet Take 1,000 mg by mouth daily.    [provider]  azelastine (ASTELIN) 0.1 % nasal spray Place 2 sprays into both nostrils 2 (two) times daily. Use in each nostril as directed Patient taking differently: Place 2 sprays into both nostrils daily as needed for allergies. Use in each nostril as directed 11/09/20   Martyn Ehrich, NP  budesonide-formoterol [facility]) 160-4.5 MCG/ACT inhaler Inhale 2 puffs into the lungs in the morning and at bedtime. Patient taking differently: Inhale 2 puffs into the lungs daily as needed (Cough). 11/08/20    Icard, Octavio Graves, DO  CALCIUM CITRATE-VITAMIN D PO Take 2 tablets by mouth 2 (two) times daily.  500 mg / 400 mg    [provider]  Cholecalciferol (VITAMIN D) 50 MCG (2000 UT) tablet Take 2,000 Units by mouth daily.    [provider]  Choline Fenofibrate (FENOFIBRIC ACID) 135 MG CPDR TAKE ONE CAPSULE EACH DAY 08/14/21   Troy Sine, MD  diphenhydrAMINE HCl, Sleep, (ZZZQUIL) 50 MG/30ML LIQD Take 30 mg by mouth at bedtime.    [provider]  estradiol (VIVELLE-DOT) 0.025 MG/24HR Place 1 patch onto the skin 2 (two) times a week.      [provider]  ezetimibe (ZETIA) 10 MG tablet TAKE ONE TABLET DAILY 01/23/22   Troy Sine, MD  levothyroxine (SYNTHROID) 88 MCG tablet TAKE ONE TABLET DAILY BEFORE BREAKFAST 08/14/21   Troy Sine, MD  loratadine (CLARITIN) 10 MG tablet Take 1 tablet (10 mg total) by mouth daily. Patient taking differently: Take 10 mg by mouth daily as needed for allergies. 11/09/20   Martyn Ehrich, NP  multivitamin [facility]) per tablet Take 1 tablet by mouth daily.      [provider]  olmesartan (BENICAR) 20 MG tablet TAKE ONE TABLET BY MOUTH ONCE DAILY 11/20/21   Troy Sine, MD  Omega-3 Fatty Acids (FISH OIL) 1000 MG CAPS Take 2 capsules by mouth 2 (two) times daily.    [provider]  oxybutynin (DITROPAN-XL) 10 MG 24 hr tablet Take 10 mg by mouth daily. 10/27/20   [provider]  progesterone (PROMETRIUM) 100 MG capsule Take 100 mg by mouth See admin instructions. Days 1-10 of every month 07/21/18   [provider]  spironolactone (ALDACTONE) 25 MG tablet TAKE ONE-HALF TABLET DAILY 10/28/21   Troy Sine, MD  TURMERIC CURCUMIN PO Take 500 mg by mouth daily.    [provider]  Vitamin D, Ergocalciferol, (DRISDOL) 50000 units CAPS capsule Take 50,000 Units by mouth every 30 (thirty) days.     [provider]  zinc gluconate 50 MG tablet Take 50 mg by mouth daily.     [provider]    Physical Exam    Vital Signs:  [name] does not have vital signs available for review today.  Given telephonic nature of communication, physical exam is limited. AAOx3. NAD. Normal affect.  Speech and respirations are unlabored.  Accessory Clinical Findings    None  Assessment & Plan    1.  Preoperative Cardiovascular Risk Assessment:  According to the Revised Cardiac Risk Index (RCRI), her Perioperative Risk of Major Cardiac Event is (%): 0.4. Her Functional Capacity in METs is: 8.97 according to the Duke Activity Status Index (DASI). Therefore, based on ACC/AHA guidelines, patient would be at acceptable risk for the planned procedure without further cardiovascular testing.   The patient was advised that if she develops  new symptoms prior to surgery to contact our office to arrange for a follow-up visit, and she verbalized understanding.   A copy of this note will be routed to requesting surgeon.  Time:   Today, I have spent 5 minutes with the patient with telehealth technology discussing medical history, symptoms, and management plan.     Lenna Sciara, NP  01/31/2022, 10:36 AM

## 2022-02-04 NOTE — Patient Instructions (Signed)
DUE TO COVID-19 ONLY TWO VISITORS  (aged 68 and older)  ARE ALLOWED TO COME WITH YOU AND STAY IN THE WAITING ROOM ONLY DURING PRE OP AND PROCEDURE.   **NO VISITORS ARE ALLOWED IN THE SHORT STAY AREA OR RECOVERY ROOM!!**  IF YOU WILL BE ADMITTED INTO THE HOSPITAL YOU ARE ALLOWED ONLY FOUR SUPPORT PEOPLE DURING VISITATION HOURS ONLY (7 AM -8PM)   The support person(s) must pass our screening, gel in and out, and wear a mask at all times, including in the patient's room. Patients must also wear a mask when staff or their support person are in the room. Visitors GUEST BADGE MUST BE WORN VISIBLY  One adult visitor may remain with you overnight and MUST be in the room by 8 P.M.     Your procedure is scheduled on: 02/18/22   Report to [facility] Main Entrance    Report to admitting at : 7:30 AM   Call this number if you have problems the morning of surgery [phone]   Do not eat food :After Midnight.   After Midnight you may have the following liquids until : 7:00 AM DAY OF SURGERY  Water Black Coffee (sugar ok, NO MILK/CREAM OR CREAMERS)  Tea (sugar ok, NO MILK/CREAM OR CREAMERS) regular and decaf                             Plain Jell-O (NO RED)                                           Fruit ices (not with fruit pulp, NO RED)                                     Popsicles (NO RED)                                                                  Juice: apple, WHITE grape, WHITE cranberry Sports drinks like Gatorade (NO RED)   The day of surgery:  Drink ONE (1) Pre-Surgery Clear Ensure or G2 at : 7:00 AM the morning of surgery. Drink in one sitting. Do not sip.  This drink was given to you during your hospital  pre-op appointment visit. Nothing else to drink after completing the  Pre-Surgery Clear Ensure or G2.          If you have questions, please contact your surgeon's office.  Oral Hygiene is also important to reduce your risk of infection.                                     Remember - BRUSH YOUR TEETH THE MORNING OF SURGERY WITH YOUR REGULAR TOOTHPASTE  DENTURES WILL BE REMOVED PRIOR TO SURGERY PLEASE DO NOT APPLY "Poly grip" OR ADHESIVES!!!   Do NOT smoke after Midnight   Take these medicines the morning of surgery with A SIP OF WATER:progesterone,synthroid,oxybutynin.  You may not have any metal on your body including hair pins, jewelry, and body piercing             Do not wear make-up, lotions, powders, perfumes/cologne, or deodorant  Do not wear nail polish including gel and S&S, artificial/acrylic nails, or any other type of covering on natural nails including finger and toenails. If you have artificial nails, gel coating, etc. that needs to be removed by a nail salon please have this removed prior to surgery or surgery may need to be canceled/ delayed if the surgeon/ anesthesia feels like they are unable to be safely monitored.   Do not shave  48 hours prior to surgery.    Do not bring valuables to the hospital. [place].   Contacts, glasses, or bridgework may not be worn into surgery.   Bring small overnight bag day of surgery.   DO NOT [place]. PHARMACY WILL DISPENSE MEDICATIONS LISTED ON YOUR MEDICATION LIST TO YOU DURING YOUR ADMISSION [place]!    Patients discharged on the day of surgery will not be allowed to drive home.  Someone NEEDS to stay with you for the first 24 hours after anesthesia.   Special Instructions: Bring a copy of your healthcare power of attorney and living will documents         the day of surgery if you haven't scanned them before.              Please read over the following fact sheets you were given: IF YOU HAVE QUESTIONS ABOUT YOUR PRE-OP INSTRUCTIONS PLEASE CALL [phone]    [facility] - Preparing for Surgery Before surgery, you can play an important role.  Because skin is not  sterile, your skin needs to be as free of germs as possible.  You can reduce the number of germs on your skin by washing with CHG (chlorahexidine gluconate) soap before surgery.  CHG is an antiseptic cleaner which kills germs and bonds with the skin to continue killing germs even after washing. Please DO NOT use if you have an allergy to CHG or antibacterial soaps.  If your skin becomes reddened/irritated stop using the CHG and inform your nurse when you arrive at Short Stay. Do not shave (including legs and underarms) for at least 48 hours prior to the first CHG shower.  You may shave your face/neck. Please follow these instructions carefully:  1.  Shower with CHG Soap the night before surgery and the  morning of Surgery.  2.  If you choose to wash your hair, wash your hair first as usual with your  normal  shampoo.  3.  After you shampoo, rinse your hair and body thoroughly to remove the  shampoo.                           4.  Use CHG as you would any other liquid soap.  You can apply chg directly  to the skin and wash                       Gently with a scrungie or clean washcloth.  5.  Apply the CHG Soap to your body ONLY FROM THE NECK DOWN.   Do not use on face/ open  Wound or open sores. Avoid contact with eyes, ears mouth and genitals (private parts).                       Wash face,  Genitals (private parts) with your normal soap.             6.  Wash thoroughly, paying special attention to the area where your surgery  will be performed.  7.  Thoroughly rinse your body with warm water from the neck down.  8.  DO NOT shower/wash with your normal soap after using and rinsing off  the CHG Soap.                9.  Pat yourself dry with a clean towel.            10.  Wear clean pajamas.            11.  Place clean sheets on your bed the night of your first shower and do not  sleep with pets. Day of Surgery : Do not apply any lotions/deodorants the morning of surgery.   Please wear clean clothes to the hospital/surgery center.  FAILURE TO FOLLOW THESE INSTRUCTIONS MAY RESULT IN THE CANCELLATION OF YOUR SURGERY PATIENT SIGNATURE_________________________________  NURSE SIGNATURE__________________________________  ________________________________________________________________________  Adam Phenix  An incentive spirometer is a tool that can help keep your lungs clear and active. This tool measures how well you are filling your lungs with each breath. Taking long deep breaths may help reverse or decrease the chance of developing breathing (pulmonary) problems (especially infection) following: A long period of time when you are unable to move or be active. BEFORE THE PROCEDURE  If the spirometer includes an indicator to show your best effort, your nurse or respiratory therapist will set it to a desired goal. If possible, sit up straight or lean slightly forward. Try not to slouch. Hold the incentive spirometer in an upright position. INSTRUCTIONS FOR USE  Sit on the edge of your bed if possible, or sit up as far as you can in bed or on a chair. Hold the incentive spirometer in an upright position. Breathe out normally. Place the mouthpiece in your mouth and seal your lips tightly around it. Breathe in slowly and as deeply as possible, raising the piston or the ball toward the top of the column. Hold your breath for 3-5 seconds or for as long as possible. Allow the piston or ball to fall to the bottom of the column. Remove the mouthpiece from your mouth and breathe out normally. Rest for a few seconds and repeat Steps 1 through 7 at least 10 times every 1-2 hours when you are awake. Take your time and take a few normal breaths between deep breaths. The spirometer may include an indicator to show your best effort. Use the indicator as a goal to work toward during each repetition. After each set of 10 deep breaths, practice coughing to be sure your  lungs are clear. If you have an incision (the cut made at the time of surgery), support your incision when coughing by placing a pillow or rolled up towels firmly against it. Once you are able to get out of bed, walk around indoors and cough well. You may stop using the incentive spirometer when instructed by your caregiver.  RISKS AND COMPLICATIONS Take your time so you do not get dizzy or light-headed. If you are in pain, you may need to take or ask  for pain medication before doing incentive spirometry. It is harder to take a deep breath if you are having pain. AFTER USE Rest and breathe slowly and easily. It can be helpful to keep track of a log of your progress. Your caregiver can provide you with a simple table to help with this. If you are using the spirometer at home, follow these instructions: [place] IF:  You are having difficultly using the spirometer. You have trouble using the spirometer as often as instructed. Your pain medication is not giving enough relief while using the spirometer. You develop fever of 100.5 F (38.1 C) or higher. SEEK IMMEDIATE MEDICAL CARE IF:  You cough up bloody sputum that had not been present before. You develop fever of 102 F (38.9 C) or greater. You develop worsening pain at or near the incision site. MAKE SURE YOU:  Understand these instructions. Will watch your condition. Will get help right away if you are not doing well or get worse. Document Released: 05/26/2006 Document Revised: 04/07/2011 Document Reviewed: 07/27/2006 [facility] Patient Information 2014 [place], [place].   ________________________________________________________________________

## 2022-02-05 ENCOUNTER — Other Ambulatory Visit: Payer: Self-pay

## 2022-02-05 ENCOUNTER — Encounter (HOSPITAL_COMMUNITY)
Admission: RE | Admit: 2022-02-05 | Discharge: 2022-02-05 | Disposition: A | Discharge status: Home / Self Care | Payer: Medicare Other | Source: Ambulatory Visit | Attending: Orthopedic Surgery | Admitting: Orthopedic Surgery

## 2022-02-05 ENCOUNTER — Encounter (HOSPITAL_COMMUNITY): Payer: Self-pay

## 2022-02-05 VITALS — BP 137/68 | HR 84 | Temp 98.7°F | Ht 64.5 in | Wt 175.0 lb

## 2022-02-05 DIAGNOSIS — D649 Anemia, unspecified: Secondary | ICD-10-CM | POA: Diagnosis not present

## 2022-02-05 DIAGNOSIS — E039 Hypothyroidism, unspecified: Secondary | ICD-10-CM | POA: Insufficient documentation

## 2022-02-05 DIAGNOSIS — E7800 Pure hypercholesterolemia, unspecified: Secondary | ICD-10-CM | POA: Insufficient documentation

## 2022-02-05 DIAGNOSIS — R011 Cardiac murmur, unspecified: Secondary | ICD-10-CM | POA: Insufficient documentation

## 2022-02-05 DIAGNOSIS — E669 Obesity, unspecified: Secondary | ICD-10-CM | POA: Insufficient documentation

## 2022-02-05 DIAGNOSIS — Z01818 Encounter for other preprocedural examination: Secondary | ICD-10-CM

## 2022-02-05 DIAGNOSIS — M1712 Unilateral primary osteoarthritis, left knee: Secondary | ICD-10-CM | POA: Insufficient documentation

## 2022-02-05 DIAGNOSIS — Z01812 Encounter for preprocedural laboratory examination: Secondary | ICD-10-CM | POA: Diagnosis present

## 2022-02-05 DIAGNOSIS — I10 Essential (primary) hypertension: Secondary | ICD-10-CM

## 2022-02-05 DIAGNOSIS — Z6829 Body mass index (BMI) 29.0-29.9, adult: Secondary | ICD-10-CM | POA: Insufficient documentation

## 2022-02-05 LAB — CBC
HCT: 40.7 % (ref 36.0–46.0)
Hemoglobin: 13.4 g/dL (ref 12.0–15.0)
MCH: 30.2 pg (ref 26.0–34.0)
MCHC: 32.9 g/dL (ref 30.0–36.0)
MCV: 91.9 fL (ref 80.0–100.0)
Platelets: 296 10*3/uL (ref 150–400)
RBC: 4.43 MIL/uL (ref 3.87–5.11)
RDW: 12.4 % (ref 11.5–15.5)
WBC: 6 10*3/uL (ref 4.0–10.5)
nRBC: 0 % (ref 0.0–0.2)

## 2022-02-05 LAB — BASIC METABOLIC PANEL
Anion gap: 8 (ref 5–15)
BUN: 22 mg/dL (ref 8–23)
CO2: 25 mmol/L (ref 22–32)
Calcium: 9.8 mg/dL (ref 8.9–10.3)
Chloride: 105 mmol/L (ref 98–111)
Creatinine, Ser: 0.72 mg/dL (ref 0.44–1.00)
GFR, Estimated: >60 mL/min (ref >60)
Glucose, Bld: 119 mg/dL — ABNORMAL HIGH (ref 70–99)
Potassium: 4.8 mmol/L (ref 3.5–5.1)
Sodium: 138 mmol/L (ref 135–145)

## 2022-02-05 LAB — SURGICAL PCR SCREEN
MRSA, PCR: NEGATIVE
Staphylococcus aureus: NEGATIVE

## 2022-02-05 NOTE — Progress Notes (Signed)
For Short Stay: Holiday appointment date:  Bowel Prep reminder:   For Anesthesia: PCP - Dr. Shon Baton Cardiologist - Dr. Shelva Majestic Clearance: Diona Browner: NP: 01/31/22 Chest x-ray -  EKG - 11/04/21 Stress Test -  ECHO - 10/14/21 Cardiac Cath -  Pacemaker/ICD device last checked: Pacemaker orders received: Device Rep notified:  Spinal Cord Stimulator:  Sleep Study -  CPAP -   Fasting Blood Sugar -  Checks Blood Sugar _____ times a day Date and result of last Hgb A1c-  Last dose of GLP1 agonist-  GLP1 instructions:   Last dose of SGLT-2 inhibitors-  SGLT-2 instructions:   Blood Thinner Instructions: Aspirin Instructions: Last Dose:  Activity level: Can go up a flight of stairs and activities of daily living without stopping and without chest pain and/or shortness of breath   Able to exercise without chest pain and/or shortness of breath  Anesthesia review: Hx: HTN,Heart Murmur.  Patient denies shortness of breath, fever, cough and chest pain at PAT appointment   Patient verbalized understanding of instructions that were given to them at the PAT appointment. Patient was also instructed that they will need to review over the PAT instructions again at home before surgery.

## 2022-02-06 NOTE — Progress Notes (Signed)
Anesthesia Chart Review   Case: [id number] Date/Time: 02/18/22 0955   Procedure: TOTAL KNEE ARTHROPLASTY (Left: Knee)   Anesthesia type: Spinal   Pre-op diagnosis: Left knee osteoarthritis   Location: [facility] ROOM 09 / [facility]   Surgeons: Paralee Cancel, MD       DISCUSSION:66 y.o. never smoker with h/o HTN, left knee OA scheduled for above procedure 02/18/22 with Dr. Paralee Cancel.   Pt last seen by cardiology 01/31/2022 for preoperative evaluation.  Per OV note, "According to the Revised Cardiac Risk Index (RCRI), her Perioperative Risk of Major Cardiac Event is (%): 0.4. Her Functional Capacity in METs is: 8.97 according to the Duke Activity Status Index (DASI). Therefore, based on ACC/AHA guidelines, patient would be at acceptable risk for the planned procedure without further cardiovascular testing.    The patient was advised that if she develops new symptoms prior to surgery to contact our office to arrange for a follow-up visit, and she verbalized understanding."  Anticipate pt can proceed with planned procedure barring acute status change.   VS: BP 137/68   Pulse 84   Temp 37.1 C (Oral)   Ht 5' 4.5" (1.638 m)   Wt 79.4 kg   SpO2 100%   BMI 29.57 kg/m   PROVIDERS: Shon Baton, MD is PCP   Primary Cardiologist:  Shelva Majestic, MD  LABS: Labs reviewed: Acceptable for surgery. (all labs ordered are listed, but only abnormal results are displayed)  Labs Reviewed  BASIC METABOLIC PANEL - Abnormal; Notable for the following components:      Result Value   Glucose, Bld 119 (*)    All other components within normal limits  SURGICAL PCR SCREEN  CBC     IMAGES:   EKG:   CV: Echo 10/14/2021 1. Left ventricular ejection fraction, by estimation, is 60 to 65%. The  left ventricle has normal function. The left ventricle has no regional  wall motion abnormalities. Left ventricular diastolic parameters were  normal.   2. Right ventricular systolic function is normal. The right  ventricular  size is normal.   3. The mitral valve is abnormal. Trivial mitral valve regurgitation. No  evidence of mitral stenosis. Moderate mitral annular calcification.   4. The aortic valve is tricuspid. Aortic valve regurgitation is not  visualized. No aortic stenosis is present.   5. The inferior vena cava is normal in size with greater than 50%  respiratory variability, suggesting right atrial pressure of 3 mmHg.  Past Medical History:  Diagnosis Date   Anemia    Arthritis    Bronchitis    Depression    denies, borther passed away   Fatigue    resolved    Heart murmur    Hypercholesterolemia    Hyperlipidemia    controlled    Hypertension    controlled   Hypothyroidism    Insomnia    Obesity    Skin disorder    rosacea    Past Surgical History:  Procedure Laterality Date   APPENDECTOMY  1976   BREAST IMPLANT REMOVAL Bilateral    implanted 1996 removed 2002   CARPAL TUNNEL RELEASE     bilateral    ENDOMETRIAL FULGURATION  1992   foot surgery      tumor removed (left)    HYSTEROSCOPY WITH D & C N/A 06/15/2018   Procedure: DILATATION AND CURETTAGE /HYSTEROSCOPY;  Surgeon: Dian Queen, MD;  Location: [place];  Service: Gynecology;  Laterality: N/A;   hysterscopy  OOPHORECTOMY  1976   left ovary removed    TOTAL HIP ARTHROPLASTY Right 08/27/2021   Procedure: TOTAL HIP ARTHROPLASTY ANTERIOR APPROACH;  Surgeon: Paralee Cancel, MD;  Location: [facility];  Service: Orthopedics;  Laterality: Right;   TOTAL SHOULDER ARTHROPLASTY Right 09/17/2018   Procedure: TOTAL SHOULDER ARTHROPLASTY anatomical;  Surgeon: Netta Cedars, MD;  Location: [facility];  Service: Orthopedics;  Laterality: Right;    MEDICATIONS:  Ascorbic Acid (VITAMIN C) 1000 MG tablet   CALCIUM CITRATE-VITAMIN D PO   Cholecalciferol (VITAMIN D) 50 MCG (2000 UT) tablet   Choline Fenofibrate (FENOFIBRIC ACID) 135 MG CPDR   diphenhydrAMINE HCl, Sleep, (ZZZQUIL) 50 MG/30ML LIQD   estradiol  (VIVELLE-DOT) 0.025 MG/24HR   ezetimibe (ZETIA) 10 MG tablet   levothyroxine (SYNTHROID) 88 MCG tablet   loratadine (CLARITIN) 10 MG tablet   meloxicam (MOBIC) 7.5 MG tablet   multivitamin (THERAGRAN) per tablet   olmesartan (BENICAR) 20 MG tablet   Omega-3 Fatty Acids (FISH OIL) 1000 MG CAPS   oxybutynin (DITROPAN-XL) 10 MG 24 hr tablet   progesterone (PROMETRIUM) 100 MG capsule   Propylene Glycol, PF, (SYSTANE COMPLETE PF) 0.6 % SOLN   spironolactone (ALDACTONE) 25 MG tablet   TURMERIC CURCUMIN PO   Vitamin D, Ergocalciferol, (DRISDOL) 50000 units CAPS capsule   zinc gluconate 50 MG tablet   No current facility-administered medications for this encounter.   Konrad Felix Ward, PA-C [facility] [phone]

## 2022-02-17 NOTE — H&P (Signed)
TOTAL KNEE SDD H&P  Patient is being admitted for left total knee arthroplasty.  Subjective:  Chief Complaint:left knee pain.  HPI: Megan Savage, 67 y.o. female, has a history of pain and functional disability in the left knee due to arthritis and has failed non-surgical conservative treatments for greater than 12 weeks to includeNSAID's and/or analgesics, corticosteriod injections, and activity modification.  Onset of symptoms was gradual, starting 2 years ago with gradually worsening course since that time. The patient noted prior procedures on the knee to include  arthroscopy and menisectomy on the left knee(s).  Patient currently rates pain in the left knee(s) at 7 out of 10 with activity. Patient has worsening of pain with activity and weight bearing and pain that interferes with activities of daily living.  Patient has evidence of joint space narrowing by imaging studies. There is no active infection.  Patient Active Problem List   Diagnosis Date Noted   S/P total right hip arthroplasty 08/27/2021   Mild intermittent acute asthmatic bronchitis 11/09/2020   Abnormal cervical Papanicolaou smear 11/07/2020   Arthritis 11/07/2020   Disorder of skin 11/07/2020   Seasonal allergic rhinitis due to pollen 05/03/2020   Hypothyroidism (acquired) 12/06/2018   S/P shoulder replacement, right 09/17/2018   Trochanteric bursitis of right hip 05/24/2018   Pain in joint of right shoulder 05/17/2018   Osteoarthritis of left knee 03/07/2018   Localized, primary osteoarthritis of shoulder region 03/07/2018   Pain in left knee 01/21/2018   Pulmonary nodule, right 07/21/2017   Left wrist pain 05/28/2017   Pain in joint of right hip 03/31/2017   Chronic cough 10/03/2010   Hypertension 06/28/2010   Hyperlipemia 06/28/2010   Past Medical History:  Diagnosis Date   Anemia    Arthritis    Bronchitis    Depression    denies, borther passed away   Fatigue    resolved    Heart murmur     Hypercholesterolemia    Hyperlipidemia    controlled    Hypertension    controlled   Hypothyroidism    Insomnia    Obesity    Skin disorder    rosacea    Past Surgical History:  Procedure Laterality Date   APPENDECTOMY  1976   BREAST IMPLANT REMOVAL Bilateral    implanted 1996 removed 2002   CARPAL TUNNEL RELEASE     bilateral    ENDOMETRIAL FULGURATION  1992   foot surgery      tumor removed (left)    HYSTEROSCOPY WITH D & C N/A 06/15/2018   Procedure: DILATATION AND CURETTAGE /HYSTEROSCOPY;  Surgeon: Dian Queen, MD;  Location: [place];  Service: Gynecology;  Laterality: N/A;   hysterscopy      OOPHORECTOMY  1976   left ovary removed    TOTAL HIP ARTHROPLASTY Right 08/27/2021   Procedure: TOTAL HIP ARTHROPLASTY ANTERIOR APPROACH;  Surgeon: Paralee Cancel, MD;  Location[facility];  Service: Orthopedics;  Laterality: Right;   TOTAL SHOULDER ARTHROPLASTY Right 09/17/2018   Procedure: TOTAL SHOULDER ARTHROPLASTY anatomical;  Surgeon: Netta Cedars, MD;  Location[facility];  Service: Orthopedics;  Laterality: Right;    No current facility-administered medications for this encounter.   Current Outpatient Medications  Medication Sig Dispense Refill Last Dose   Ascorbic Acid (VITAMIN C) 1000 MG tablet Take 1,000 mg by mouth daily.      CALCIUM CITRATE-VITAMIN D PO Take 2 tablets by mouth 2 (two) times daily.  500 mg / 400 Units  Cholecalciferol (VITAMIN D) 50 MCG (2000 UT) tablet Take 2,000 Units by mouth daily.      Choline Fenofibrate (FENOFIBRIC ACID) 135 MG CPDR TAKE ONE CAPSULE EACH DAY 90 capsule 2    diphenhydrAMINE HCl, Sleep, (ZZZQUIL) 50 MG/30ML LIQD Take 30 mg by mouth at bedtime.      estradiol (VIVELLE-DOT) 0.025 MG/24HR Place 1 patch onto the skin 2 (two) times a week.        ezetimibe (ZETIA) 10 MG tablet TAKE ONE TABLET DAILY 90 tablet 3    levothyroxine (SYNTHROID) 88 MCG tablet TAKE ONE TABLET DAILY BEFORE BREAKFAST 90 tablet 2    loratadine  (CLARITIN) 10 MG tablet Take 1 tablet (10 mg total) by mouth daily. (Patient taking differently: Take 10 mg by mouth daily as needed for allergies.) 90 tablet 3    meloxicam (MOBIC) 7.5 MG tablet Take 7.5 mg by mouth 2 (two) times daily.      multivitamin (THERAGRAN) per tablet Take 1 tablet by mouth daily.        olmesartan (BENICAR) 20 MG tablet TAKE ONE TABLET BY MOUTH ONCE DAILY 90 tablet 3    Omega-3 Fatty Acids (FISH OIL) 1000 MG CAPS Take 2 capsules by mouth 2 (two) times daily.      oxybutynin (DITROPAN-XL) 10 MG 24 hr tablet Take 10 mg by mouth daily.      progesterone (PROMETRIUM) 100 MG capsule Take 100 mg by mouth See admin instructions. Days 1-10 of every month      Propylene Glycol, PF, (SYSTANE COMPLETE PF) 0.6 % SOLN Place 1 drop into both eyes daily as needed (Dry eye).      spironolactone (ALDACTONE) 25 MG tablet TAKE ONE-HALF TABLET DAILY 45 tablet 1    TURMERIC CURCUMIN PO Take 500 mg by mouth daily. 1000 mg tab      Vitamin D, Ergocalciferol, (DRISDOL) 50000 units CAPS capsule Take 50,000 Units by mouth every 30 (thirty) days.       zinc gluconate 50 MG tablet Take 50 mg by mouth daily.      Allergies  Allergen Reactions   Cat Hair Extract Other (See Comments)    Watery eyes    Social History   Tobacco Use   Smoking status: Never   Smokeless tobacco: Never  Substance Use Topics   Alcohol use: Yes    Comment: 2-3 glasses wine daily , 1 drink each weekend     Family History  Problem Relation Age of Onset   Arrhythmia Mother    Emphysema Mother    Asthma Mother    Cancer Father    Cancer Brother        leumkemia   Asthma Brother    Asthma Paternal Aunt      Review of Systems  Constitutional:  Negative for chills and fever.  Respiratory:  Negative for cough and shortness of breath.   Cardiovascular:  Negative for chest pain.  Gastrointestinal:  Negative for nausea and vomiting.  Musculoskeletal:  Positive for arthralgias.     Objective:  Physical  Exam Well nourished and well developed. General: Alert and oriented x3, cooperative and pleasant, no acute distress. Head: normocephalic, atraumatic, neck supple. Eyes: EOMI.  Musculoskeletal: Left knee exam: No palpable effusion, warmth erythema Medial and lateral joint line tenderness No significant flexion contracture with flexion to 120 degrees with tightness and crepitation anteriorly Neurovascular intact distally Normal ipsilateral left hip exam  Calves soft and nontender. Motor function intact in LE. Strength 5/5 LE  bilaterally. Neuro: Distal pulses 2+. Sensation to light touch intact in LE.   Vital signs in last 24 hours:    Labs:   Estimated body mass index is 29.57 kg/m as calculated from the following:   Height as of 02/05/22: 5' 4.5" (1.638 m).   Weight as of 02/05/22: 79.4 kg.   Imaging Review Plain radiographs demonstrate severe degenerative joint disease of the left knee(s). The overall alignment isneutral. The bone quality appears to be adequate for age and reported activity level.      Assessment/Plan:  End stage arthritis, left knee   The patient history, physical examination, clinical judgment of the provider and imaging studies are consistent with end stage degenerative joint disease of the left knee(s) and total knee arthroplasty is deemed medically necessary. The treatment options including medical management, injection therapy arthroscopy and arthroplasty were discussed at length. The risks and benefits of total knee arthroplasty were presented and reviewed. The risks due to aseptic loosening, infection, stiffness, patella tracking problems, thromboembolic complications and other imponderables were discussed. The patient acknowledged the explanation, agreed to proceed with the plan and consent was signed. Patient is being admitted for inpatient treatment for surgery, pain control, PT, OT, prophylactic antibiotics, VTE prophylaxis, progressive ambulation  and ADL's and discharge planning. The patient is planning to be discharged  home.  Therapy Plans: outpatient therapy at [facility] Disposition: Home with cousin Planned DVT Prophylaxis: aspirin '81mg'$  BID DME needed: none PCP: Dr. Virgina Jock, clearance received Cardio: Dr. Claiborne Billings, clearance received TXA: IV Allergies: NKDA Anesthesia Concerns: none BMI: 30.3 Last HgbA1c: Not diabetic   Other: - Got an ice machine - History of right THA - Wants to be sure baker's cyst fluid drained - No hx of VTE or CA - celebrex, oxycodone, tylenol, robaxin (no stool softeners) - SDD   Patient's anticipated LOS is less than 2 midnights, meeting these requirements: - Younger than 48 - Lives within 1 hour of care - Has a competent adult at home to recover with post-op recover - NO history of  - Chronic pain requiring opiods  - Diabetes  - Coronary Artery Disease  - Heart failure  - Heart attack  - Stroke  - DVT/VTE  - Cardiac arrhythmia  - Respiratory Failure/COPD  - Renal failure  - Anemia  - Advanced Liver disease  Costella Hatcher, PA-C Orthopedic Surgery [facility] [place] [phone]

## 2022-02-17 NOTE — Anesthesia Preprocedure Evaluation (Signed)
Anesthesia Evaluation    Reviewed: Allergy & Precautions, Patient's Chart, lab work & pertinent test results  Airway Mallampati: III  TM Distance: >3 FB Neck ROM: Full    Dental no notable dental hx.    Pulmonary asthma    Pulmonary exam normal        Cardiovascular hypertension, Pt. on medications Normal cardiovascular exam  Echo 10/14/2021 1. Left ventricular ejection fraction, by estimation, is 60 to 65%. The  left ventricle has normal function. The left ventricle has no regional  wall motion abnormalities. Left ventricular diastolic parameters were  normal.   2. Right ventricular systolic function is normal. The right ventricular  size is normal.   3. The mitral valve is abnormal. Trivial mitral valve regurgitation. No  evidence of mitral stenosis. Moderate mitral annular calcification.   4. The aortic valve is tricuspid. Aortic valve regurgitation is not  visualized. No aortic stenosis is present.   5. The inferior vena cava is normal in size with greater than 50%  respiratory variability, suggesting right atrial pressure of 3 mmHg.     Neuro/Psych  PSYCHIATRIC DISORDERS  Depression    negative neurological ROS     GI/Hepatic negative GI ROS, Neg liver ROS,,,  Endo/Other  Hypothyroidism    Renal/GU negative Renal ROS     Musculoskeletal  (+) Arthritis ,    Abdominal   Peds  Hematology negative hematology ROS (+)   Anesthesia Other Findings Right hip osteoarthritis  Reproductive/Obstetrics                             Anesthesia Physical Anesthesia Plan  ASA: 2  Anesthesia Plan: Spinal   Post-op Pain Management: Regional block*, Tylenol PO (pre-op)* and Celebrex PO (pre-op)*   Induction: Intravenous  PONV Risk Score and Plan: 2 and Ondansetron, Dexamethasone, Propofol infusion, Midazolam and Treatment may vary due to age or medical condition  Airway Management Planned: Simple  Face Mask  Additional Equipment:   Intra-op Plan:   Post-operative Plan:   Informed Consent:   Plan Discussed with: Anesthesiologist  Anesthesia Plan Comments:         Anesthesia Quick Evaluation

## 2022-02-18 ENCOUNTER — Encounter (HOSPITAL_COMMUNITY)
Admission: RE | Disposition: A | Discharge status: Home / Self Care | Payer: Self-pay | Source: Ambulatory Visit | Attending: Orthopedic Surgery

## 2022-02-18 ENCOUNTER — Other Ambulatory Visit: Payer: Self-pay

## 2022-02-18 ENCOUNTER — Ambulatory Visit (HOSPITAL_BASED_OUTPATIENT_CLINIC_OR_DEPARTMENT_OTHER): Payer: Medicare Other | Admitting: Anesthesiology

## 2022-02-18 ENCOUNTER — Ambulatory Visit (HOSPITAL_COMMUNITY): Payer: Medicare Other | Admitting: Physician Assistant

## 2022-02-18 ENCOUNTER — Encounter (HOSPITAL_COMMUNITY): Payer: Self-pay | Admitting: Orthopedic Surgery

## 2022-02-18 ENCOUNTER — Observation Stay (HOSPITAL_COMMUNITY)
Admission: RE | Admit: 2022-02-18 | Discharge: 2022-02-19 | Disposition: A | Discharge status: Home / Self Care | Payer: Medicare Other | Source: Ambulatory Visit | Attending: Orthopedic Surgery | Admitting: Orthopedic Surgery

## 2022-02-18 DIAGNOSIS — Z96611 Presence of right artificial shoulder joint: Secondary | ICD-10-CM | POA: Insufficient documentation

## 2022-02-18 DIAGNOSIS — M25462 Effusion, left knee: Secondary | ICD-10-CM | POA: Diagnosis not present

## 2022-02-18 DIAGNOSIS — M1712 Unilateral primary osteoarthritis, left knee: Secondary | ICD-10-CM | POA: Diagnosis present

## 2022-02-18 DIAGNOSIS — I10 Essential (primary) hypertension: Secondary | ICD-10-CM | POA: Diagnosis not present

## 2022-02-18 DIAGNOSIS — Z96641 Presence of right artificial hip joint: Secondary | ICD-10-CM | POA: Diagnosis not present

## 2022-02-18 DIAGNOSIS — Z96652 Presence of left artificial knee joint: Secondary | ICD-10-CM

## 2022-02-18 DIAGNOSIS — M25762 Osteophyte, left knee: Secondary | ICD-10-CM

## 2022-02-18 DIAGNOSIS — Z79899 Other long term (current) drug therapy: Secondary | ICD-10-CM | POA: Insufficient documentation

## 2022-02-18 DIAGNOSIS — E039 Hypothyroidism, unspecified: Secondary | ICD-10-CM | POA: Insufficient documentation

## 2022-02-18 HISTORY — PX: TOTAL KNEE ARTHROPLASTY: SHX125

## 2022-02-18 SURGERY — ARTHROPLASTY, KNEE, TOTAL
Anesthesia: Spinal | Site: Knee | Laterality: Left

## 2022-02-18 MED ORDER — BUPIVACAINE HCL (PF) 0.25 % IJ SOLN
INTRAMUSCULAR | Status: AC
  Filled 2022-02-18: qty 30

## 2022-02-18 MED ORDER — KETOROLAC TROMETHAMINE 30 MG/ML IJ SOLN
INTRAMUSCULAR | Status: DC | PRN
  Administered 2022-02-18: 30 mg

## 2022-02-18 MED ORDER — SODIUM CHLORIDE 0.9 % IR SOLN
Status: DC | PRN
  Administered 2022-02-18: 1000 mL

## 2022-02-18 MED ORDER — METHOCARBAMOL 500 MG IVPB - SIMPLE MED: 500.0000 mg | Freq: Four times a day (QID) | INTRAVENOUS | Status: DC | PRN

## 2022-02-18 MED ORDER — CEFAZOLIN SODIUM-DEXTROSE 2-4 GM/100ML-% IV SOLN
2.0000 g | Freq: Four times a day (QID) | INTRAVENOUS | Status: AC
  Administered 2022-02-18 (×2): 2 g via INTRAVENOUS
  Filled 2022-02-18 (×2): qty 100

## 2022-02-18 MED ORDER — PHENYLEPHRINE HCL (PRESSORS) 10 MG/ML IV SOLN
INTRAVENOUS | Status: AC
  Filled 2022-02-18: qty 1

## 2022-02-18 MED ORDER — POVIDONE-IODINE 10 % EX SWAB
2.0000 | Freq: Once | CUTANEOUS | Status: AC
  Administered 2022-02-18: 2 via TOPICAL

## 2022-02-18 MED ORDER — 0.9 % SODIUM CHLORIDE (POUR BTL) OPTIME
TOPICAL | Status: DC | PRN
  Administered 2022-02-18: 1000 mL

## 2022-02-18 MED ORDER — DEXAMETHASONE SODIUM PHOSPHATE 10 MG/ML IJ SOLN
10.0000 mg | Freq: Once | INTRAMUSCULAR | Status: AC
  Administered 2022-02-19: 10 mg via INTRAVENOUS
  Filled 2022-02-18: qty 1

## 2022-02-18 MED ORDER — POLYETHYLENE GLYCOL 3350 17 G PO PACK
17.0000 g | PACK | Freq: Two times a day (BID) | ORAL | Status: DC
  Filled 2022-02-18 (×2): qty 1

## 2022-02-18 MED ORDER — ONDANSETRON HCL 4 MG/2ML IJ SOLN
INTRAMUSCULAR | Status: DC | PRN
  Administered 2022-02-18: 4 mg via INTRAVENOUS

## 2022-02-18 MED ORDER — ACETAMINOPHEN 325 MG PO TABS
ORAL_TABLET | ORAL | Status: DC | PRN
  Administered 2022-02-18: 1000 mg via ORAL

## 2022-02-18 MED ORDER — TRANEXAMIC ACID-NACL 1000-0.7 MG/100ML-% IV SOLN
1000.0000 mg | INTRAVENOUS | Status: AC
  Administered 2022-02-18: 1000 mg via INTRAVENOUS
  Filled 2022-02-18: qty 100

## 2022-02-18 MED ORDER — KETOROLAC TROMETHAMINE 30 MG/ML IJ SOLN
INTRAMUSCULAR | Status: AC
  Filled 2022-02-18: qty 1

## 2022-02-18 MED ORDER — PROPOFOL 10 MG/ML IV BOLUS
INTRAVENOUS | Status: AC
  Filled 2022-02-18: qty 20

## 2022-02-18 MED ORDER — SODIUM CHLORIDE (PF) 0.9 % IJ SOLN
INTRAMUSCULAR | Status: AC
  Filled 2022-02-18: qty 30

## 2022-02-18 MED ORDER — PHENYLEPHRINE HCL-NACL 20-0.9 MG/250ML-% IV SOLN
INTRAVENOUS | Status: DC | PRN
  Administered 2022-02-18: 50 ug/min via INTRAVENOUS

## 2022-02-18 MED ORDER — OXYCODONE HCL 5 MG PO TABS
10.0000 mg | ORAL_TABLET | ORAL | Status: DC | PRN
  Administered 2022-02-18: 15 mg via ORAL
  Administered 2022-02-18 (×2): 10 mg via ORAL
  Administered 2022-02-19 (×2): 15 mg via ORAL
  Filled 2022-02-18 (×3): qty 3

## 2022-02-18 MED ORDER — ACETAMINOPHEN 500 MG PO TABS: 1000.0000 mg | ORAL_TABLET | Freq: Once | ORAL | Status: AC

## 2022-02-18 MED ORDER — ACETAMINOPHEN 500 MG PO TABS
ORAL_TABLET | ORAL | Status: AC
  Administered 2022-02-18: 1000 mg via ORAL
  Filled 2022-02-18: qty 2

## 2022-02-18 MED ORDER — OXYBUTYNIN CHLORIDE ER 5 MG PO TB24
10.0000 mg | ORAL_TABLET | Freq: Every day | ORAL | Status: DC
  Administered 2022-02-19: 10 mg via ORAL
  Filled 2022-02-18: qty 2

## 2022-02-18 MED ORDER — DIPHENHYDRAMINE HCL (SLEEP) 50 MG/30ML PO LIQD: 30.0000 mg | Freq: Every day | ORAL | Status: DC

## 2022-02-18 MED ORDER — SPIRONOLACTONE 12.5 MG HALF TABLET
12.5000 mg | ORAL_TABLET | Freq: Every day | ORAL | Status: DC
  Administered 2022-02-18 – 2022-02-19 (×2): 12.5 mg via ORAL
  Filled 2022-02-18 (×2): qty 1

## 2022-02-18 MED ORDER — PHENOL 1.4 % MT LIQD: 1.0000 | OROMUCOSAL | Status: DC | PRN

## 2022-02-18 MED ORDER — ONDANSETRON HCL 4 MG/2ML IJ SOLN: 4.0000 mg | Freq: Four times a day (QID) | INTRAMUSCULAR | Status: DC | PRN

## 2022-02-18 MED ORDER — LORATADINE 10 MG PO TABS: 10.0000 mg | ORAL_TABLET | Freq: Every day | ORAL | Status: DC | PRN

## 2022-02-18 MED ORDER — PROPOFOL 500 MG/50ML IV EMUL
INTRAVENOUS | Status: DC | PRN
  Administered 2022-02-18: 75 ug/kg/min via INTRAVENOUS

## 2022-02-18 MED ORDER — EZETIMIBE 10 MG PO TABS
10.0000 mg | ORAL_TABLET | Freq: Every day | ORAL | Status: DC
  Administered 2022-02-19: 10 mg via ORAL
  Filled 2022-02-18: qty 1

## 2022-02-18 MED ORDER — BUPIVACAINE-EPINEPHRINE (PF) 0.25% -1:200000 IJ SOLN
INTRAMUSCULAR | Status: DC | PRN
  Administered 2022-02-18: 30 mL

## 2022-02-18 MED ORDER — BISACODYL 10 MG RE SUPP: 10.0000 mg | Freq: Every day | RECTAL | Status: DC | PRN

## 2022-02-18 MED ORDER — EPINEPHRINE PF 1 MG/ML IJ SOLN
INTRAMUSCULAR | Status: AC
  Filled 2022-02-18: qty 1

## 2022-02-18 MED ORDER — DEXAMETHASONE SODIUM PHOSPHATE 10 MG/ML IJ SOLN
INTRAMUSCULAR | Status: AC
  Filled 2022-02-18: qty 1

## 2022-02-18 MED ORDER — METOCLOPRAMIDE HCL 5 MG PO TABS: 5.0000 mg | ORAL_TABLET | Freq: Three times a day (TID) | ORAL | Status: DC | PRN

## 2022-02-18 MED ORDER — MIDAZOLAM HCL 2 MG/2ML IJ SOLN
1.0000 mg | Freq: Once | INTRAMUSCULAR | Status: AC
  Administered 2022-02-18: 2 mg via INTRAVENOUS
  Filled 2022-02-18: qty 2

## 2022-02-18 MED ORDER — MENTHOL 3 MG MT LOZG: 1.0000 | LOZENGE | OROMUCOSAL | Status: DC | PRN

## 2022-02-18 MED ORDER — CHLORHEXIDINE GLUCONATE 0.12 % MT SOLN
15.0000 mL | Freq: Once | OROMUCOSAL | Status: AC
  Administered 2022-02-18: 15 mL via OROMUCOSAL

## 2022-02-18 MED ORDER — ORAL CARE MOUTH RINSE: 15.0000 mL | Freq: Once | OROMUCOSAL | Status: AC

## 2022-02-18 MED ORDER — DEXAMETHASONE SODIUM PHOSPHATE 10 MG/ML IJ SOLN
8.0000 mg | Freq: Once | INTRAMUSCULAR | Status: AC
  Administered 2022-02-18: 8 mg via INTRAVENOUS

## 2022-02-18 MED ORDER — ROPIVACAINE HCL 7.5 MG/ML IJ SOLN
INTRAMUSCULAR | Status: DC | PRN
  Administered 2022-02-18: 20 mL via PERINEURAL

## 2022-02-18 MED ORDER — DIPHENHYDRAMINE HCL 12.5 MG/5ML PO ELIX
12.5000 mg | ORAL_SOLUTION | ORAL | Status: DC | PRN
  Administered 2022-02-19: 25 mg via ORAL
  Filled 2022-02-18: qty 10

## 2022-02-18 MED ORDER — STERILE WATER FOR IRRIGATION IR SOLN
Status: DC | PRN
  Administered 2022-02-18: 2000 mL

## 2022-02-18 MED ORDER — BUPIVACAINE IN DEXTROSE 0.75-8.25 % IT SOLN
INTRATHECAL | Status: DC | PRN
  Administered 2022-02-18: 1.7 mL via INTRATHECAL

## 2022-02-18 MED ORDER — KETOROLAC TROMETHAMINE 15 MG/ML IJ SOLN
7.5000 mg | Freq: Four times a day (QID) | INTRAMUSCULAR | Status: DC
  Administered 2022-02-18 – 2022-02-19 (×3): 7.5 mg via INTRAVENOUS
  Filled 2022-02-18 (×3): qty 1

## 2022-02-18 MED ORDER — METHOCARBAMOL 500 MG PO TABS
500.0000 mg | ORAL_TABLET | Freq: Four times a day (QID) | ORAL | Status: DC | PRN
  Administered 2022-02-18 – 2022-02-19 (×3): 500 mg via ORAL
  Filled 2022-02-18 (×3): qty 1

## 2022-02-18 MED ORDER — FENTANYL CITRATE PF 50 MCG/ML IJ SOSY
50.0000 ug | PREFILLED_SYRINGE | Freq: Once | INTRAMUSCULAR | Status: AC
  Administered 2022-02-18: 100 ug via INTRAVENOUS
  Filled 2022-02-18: qty 2

## 2022-02-18 MED ORDER — LEVOTHYROXINE SODIUM 88 MCG PO TABS
88.0000 ug | ORAL_TABLET | Freq: Every day | ORAL | Status: DC
  Administered 2022-02-19: 88 ug via ORAL
  Filled 2022-02-18: qty 1

## 2022-02-18 MED ORDER — SODIUM CHLORIDE 0.9 % IV SOLN: INTRAVENOUS | Status: DC

## 2022-02-18 MED ORDER — METOCLOPRAMIDE HCL 5 MG/ML IJ SOLN: 5.0000 mg | Freq: Three times a day (TID) | INTRAMUSCULAR | Status: DC | PRN

## 2022-02-18 MED ORDER — LACTATED RINGERS IV SOLN: INTRAVENOUS | Status: DC

## 2022-02-18 MED ORDER — CEFAZOLIN SODIUM-DEXTROSE 2-4 GM/100ML-% IV SOLN
2.0000 g | INTRAVENOUS | Status: AC
Start: 2022-02-18 — End: 2022-02-18
  Administered 2022-02-18: 2 g via INTRAVENOUS
  Filled 2022-02-18: qty 100

## 2022-02-18 MED ORDER — ONDANSETRON HCL 4 MG PO TABS: 4.0000 mg | ORAL_TABLET | Freq: Four times a day (QID) | ORAL | Status: DC | PRN

## 2022-02-18 MED ORDER — ACETAMINOPHEN 500 MG PO TABS
1000.0000 mg | ORAL_TABLET | Freq: Four times a day (QID) | ORAL | Status: DC
  Administered 2022-02-18 – 2022-02-19 (×4): 1000 mg via ORAL
  Filled 2022-02-18 (×4): qty 2

## 2022-02-18 MED ORDER — PROPOFOL 500 MG/50ML IV EMUL
INTRAVENOUS | Status: AC
  Filled 2022-02-18: qty 50

## 2022-02-18 MED ORDER — ASPIRIN 81 MG PO CHEW
81.0000 mg | CHEWABLE_TABLET | Freq: Two times a day (BID) | ORAL | Status: DC
  Administered 2022-02-18 – 2022-02-19 (×2): 81 mg via ORAL
  Filled 2022-02-18 (×2): qty 1

## 2022-02-18 MED ORDER — ACETAMINOPHEN 325 MG PO TABS: 325.0000 mg | ORAL_TABLET | Freq: Four times a day (QID) | ORAL | Status: DC | PRN

## 2022-02-18 MED ORDER — DEXAMETHASONE SODIUM PHOSPHATE 10 MG/ML IJ SOLN
INTRAMUSCULAR | Status: DC | PRN
  Administered 2022-02-18: 5 mg

## 2022-02-18 MED ORDER — ONDANSETRON HCL 4 MG/2ML IJ SOLN
INTRAMUSCULAR | Status: AC
  Filled 2022-02-18: qty 2

## 2022-02-18 MED ORDER — IRBESARTAN 150 MG PO TABS
150.0000 mg | ORAL_TABLET | Freq: Every day | ORAL | Status: DC
  Administered 2022-02-19: 150 mg via ORAL
  Filled 2022-02-18: qty 1

## 2022-02-18 MED ORDER — TRANEXAMIC ACID-NACL 1000-0.7 MG/100ML-% IV SOLN
1000.0000 mg | Freq: Once | INTRAVENOUS | Status: AC
  Administered 2022-02-18: 1000 mg via INTRAVENOUS
  Filled 2022-02-18: qty 100

## 2022-02-18 MED ORDER — OXYCODONE HCL 5 MG PO TABS
5.0000 mg | ORAL_TABLET | ORAL | Status: DC | PRN
  Filled 2022-02-18 (×2): qty 2

## 2022-02-18 MED ORDER — PROPOFOL 10 MG/ML IV BOLUS
INTRAVENOUS | Status: DC | PRN
  Administered 2022-02-18: 30 mg via INTRAVENOUS
  Administered 2022-02-18 (×2): 20 mg via INTRAVENOUS

## 2022-02-18 MED ORDER — DOCUSATE SODIUM 100 MG PO CAPS
100.0000 mg | ORAL_CAPSULE | Freq: Two times a day (BID) | ORAL | Status: DC
  Administered 2022-02-18 – 2022-02-19 (×2): 100 mg via ORAL
  Filled 2022-02-18 (×2): qty 1

## 2022-02-18 MED ORDER — SODIUM CHLORIDE (PF) 0.9 % IJ SOLN
INTRAMUSCULAR | Status: DC | PRN
  Administered 2022-02-18: 30 mL

## 2022-02-18 MED ORDER — HYDROMORPHONE HCL 1 MG/ML IJ SOLN
0.5000 mg | INTRAMUSCULAR | Status: DC | PRN
  Administered 2022-02-18: 1 mg via INTRAVENOUS
  Filled 2022-02-18: qty 1

## 2022-02-18 SURGICAL SUPPLY — 52 items
ATTUNE MED ANAT PAT 32 KNEE (Knees) IMPLANT
BAG COUNTER SPONGE SURGICOUNT (BAG) IMPLANT
BAG ZIPLOCK 12X15 (MISCELLANEOUS) IMPLANT
BASEPLATE TIB CMT FB PCKT SZ4 (Stem) IMPLANT
BLADE SAW SGTL 11.0X1.19X90.0M (BLADE) IMPLANT
BLADE SAW SGTL 13.0X1.19X90.0M (BLADE) ×1 IMPLANT
BNDG ELASTIC 6X10 VLCR STRL LF (GAUZE/BANDAGES/DRESSINGS) IMPLANT
BNDG ELASTIC 6X5.8 VLCR STR LF (GAUZE/BANDAGES/DRESSINGS) ×1 IMPLANT
BOWL SMART MIX CTS (DISPOSABLE) ×1 IMPLANT
CEMENT HV SMART SET (Cement) IMPLANT
COMP FEM CMT ATTUNE NRS 4 LT (Joint) ×1 IMPLANT
COMPONENT FEM CMT ATTN NRS 4LT (Joint) IMPLANT
COVER SURGICAL LIGHT HANDLE (MISCELLANEOUS) ×1 IMPLANT
CUFF TOURN SGL QUICK 34 (TOURNIQUET CUFF) ×1
CUFF TRNQT CYL 34X4.125X (TOURNIQUET CUFF) ×1 IMPLANT
DERMABOND ADVANCED .7 DNX12 (GAUZE/BANDAGES/DRESSINGS) ×1 IMPLANT
DRAPE U-SHAPE 47X51 STRL (DRAPES) ×1 IMPLANT
DRESSING AQUACEL AG SP 3.5X10 (GAUZE/BANDAGES/DRESSINGS) ×1 IMPLANT
DRSG AQUACEL AG SP 3.5X10 (GAUZE/BANDAGES/DRESSINGS) ×1
DURAPREP 26ML APPLICATOR (WOUND CARE) ×2 IMPLANT
ELECT REM PT RETURN 15FT ADLT (MISCELLANEOUS) ×1 IMPLANT
GLOVE BIO SURGEON STRL SZ 6 (GLOVE) ×1 IMPLANT
GLOVE BIOGEL PI IND STRL 6.5 (GLOVE) ×1 IMPLANT
GLOVE BIOGEL PI IND STRL 7.5 (GLOVE) ×1 IMPLANT
GLOVE ORTHO TXT STRL SZ7.5 (GLOVE) ×2 IMPLANT
GOWN STRL REUS W/ TWL LRG LVL3 (GOWN DISPOSABLE) ×2 IMPLANT
GOWN STRL REUS W/TWL LRG LVL3 (GOWN DISPOSABLE) ×2
HANDPIECE INTERPULSE COAX TIP (DISPOSABLE) ×1
HOLDER FOLEY CATH W/STRAP (MISCELLANEOUS) IMPLANT
INSERT MED ATTUNE KNEE 4X6 LT (Insert) IMPLANT
KIT TURNOVER KIT A (KITS) IMPLANT
MANIFOLD NEPTUNE II (INSTRUMENTS) ×1 IMPLANT
NDL SAFETY ECLIP 18X1.5 (MISCELLANEOUS) IMPLANT
NS IRRIG 1000ML POUR BTL (IV SOLUTION) ×1 IMPLANT
PACK TOTAL KNEE CUSTOM (KITS) ×1 IMPLANT
PADDING CAST COTTON 6X4 STRL (CAST SUPPLIES) IMPLANT
PIN FIX SIGMA LCS THRD HI (PIN) IMPLANT
PROTECTOR NERVE ULNAR (MISCELLANEOUS) ×1 IMPLANT
SET HNDPC FAN SPRY TIP SCT (DISPOSABLE) ×1 IMPLANT
SET PAD KNEE POSITIONER (MISCELLANEOUS) ×1 IMPLANT
SPIKE FLUID TRANSFER (MISCELLANEOUS) ×2 IMPLANT
SUT MNCRL AB 4-0 PS2 18 (SUTURE) ×1 IMPLANT
SUT STRATAFIX PDS+ 0 24IN (SUTURE) ×1 IMPLANT
SUT VIC AB 1 CT1 36 (SUTURE) ×1 IMPLANT
SUT VIC AB 2-0 CT1 27 (SUTURE) ×2
SUT VIC AB 2-0 CT1 TAPERPNT 27 (SUTURE) ×2 IMPLANT
SYR 3ML LL SCALE MARK (SYRINGE) ×1 IMPLANT
TOWEL GREEN STERILE FF (TOWEL DISPOSABLE) ×1 IMPLANT
TRAY FOLEY MTR SLVR 16FR STAT (SET/KITS/TRAYS/PACK) ×1 IMPLANT
TUBE SUCTION HIGH CAP CLEAR NV (SUCTIONS) ×1 IMPLANT
WATER STERILE IRR 1000ML POUR (IV SOLUTION) ×2 IMPLANT
WRAP KNEE MAXI GEL POST OP (GAUZE/BANDAGES/DRESSINGS) ×1 IMPLANT

## 2022-02-18 NOTE — Anesthesia Procedure Notes (Signed)
Anesthesia Regional Block: Adductor canal block   Pre-Anesthetic Checklist: , timeout performed,  Correct Patient, Correct Site, Correct Laterality,  Correct Procedure, Correct Position, site marked,  Risks and benefits discussed,  Surgical consent,  Pre-op evaluation,  At surgeon's request and post-op pain management  Laterality: Left  Prep: chloraprep       Needles:  Injection technique: Single-shot  Needle Type: Stimulator Needle - 80     Needle Length: 10cm  Needle Gauge: 21     Additional Needles:   Narrative:  Start time: 02/18/2022 9:13 AM End time: 02/18/2022 9:23 AM Injection made incrementally with aspirations every 5 mL.  Performed by: Personally  Anesthesiologist: Duane Boston, MD

## 2022-02-18 NOTE — Transfer of Care (Signed)
Immediate Anesthesia Transfer of Care Note  Patient: Megan Savage  Procedure(s) Performed: TOTAL KNEE ARTHROPLASTY (Left: Knee)  Patient Location: PACU  Anesthesia Type:Spinal and MAC combined with regional for post-op pain  Level of Consciousness: awake, alert , oriented, and patient cooperative  Airway & Oxygen Therapy: Patient Spontanous Breathing and Patient connected to face mask oxygen  Post-op Assessment: Report given to RN, Post -op Vital signs reviewed and stable, and BP 120/62  Post vital signs: Reviewed and stable  Last Vitals:  Vitals Value Taken Time  BP    Temp    Pulse 77 02/18/22 1157  Resp 16 02/18/22 1157  SpO2 100 % 02/18/22 1157  Vitals shown include unvalidated device data.  Last Pain:  Vitals:   02/18/22 0923  TempSrc:   PainSc: 0-No pain         Complications: No notable events documented.

## 2022-02-18 NOTE — Discharge Instructions (Signed)

## 2022-02-18 NOTE — Interval H&P Note (Signed)
History and Physical Interval Note:  02/18/2022 8:40 AM  Megan Savage  has presented today for surgery, with the diagnosis of Left knee osteoarthritis.  The various methods of treatment have been discussed with the patient and family. After consideration of risks, benefits and other options for treatment, the patient has consented to  Procedure(s): TOTAL KNEE ARTHROPLASTY (Left) as a surgical intervention.  The patient's history has been reviewed, patient examined, no change in status, stable for surgery.  I have reviewed the patient's chart and labs.  Questions were answered to the patient's satisfaction.     Mauri Pole

## 2022-02-18 NOTE — Evaluation (Signed)
Physical Therapy Evaluation Patient Details Name: Megan Savage MRN: [id number] DOB: 1955-07-25 Today's Date: 02/18/2022  History of Present Illness  Pt is a 67yo female presenting s/p L-TKA on 02/18/22. PMH: HLD, HTN, hypothyroidism, R-THA 2023, R-TSA 2020.  Clinical Impression  Megan Savage is a 67 y.o. female POD 0 s/p L-TKA. Patient reports independence with mobility at baseline. Patient is now limited by functional impairments (see PT problem list below) and requires supervision for bed mobility and min guard for transfers. Patient was able to ambulate 50 feet with RW and min guard level of assist. Patient instructed in exercise to facilitate ROM and circulation to manage edema. Provided incentive spirometer and with Vcs pt able to achieve 156m. Patient will benefit from continued skilled PT interventions to address impairments and progress towards PLOF. Acute PT will follow to progress mobility and stair training in preparation for safe discharge home.       Recommendations for follow up therapy are one component of a multi-disciplinary discharge planning process, led by the attending physician.  Recommendations may be updated based on patient status, additional functional criteria and insurance authorization.  Follow Up Recommendations Follow physician's recommendations for discharge plan and follow up therapies      Assistance Recommended at Discharge Frequent or constant Supervision/Assistance  Patient can return home with the following  A little help with walking and/or transfers;A little help with bathing/dressing/bathroom;Assistance with cooking/housework;Assist for transportation;Help with stairs or ramp for entrance    Equipment Recommendations None recommended by PT  Recommendations for Other Services       Functional Status Assessment Patient has had a recent decline in their functional status and demonstrates the ability to make significant improvements in function in a  reasonable and predictable amount of time.     Precautions / Restrictions Precautions Precautions: Knee Precaution Booklet Issued: No Precaution Comments: no pillow under the knee Restrictions Weight Bearing Restrictions: No Other Position/Activity Restrictions: wbat      Mobility  Bed Mobility Overal bed mobility: Needs Assistance Bed Mobility: Supine to Sit     Supine to sit: Supervision, HOB elevated     General bed mobility comments: For safety only    Transfers Overall transfer level: Needs assistance Equipment used: Rolling walker (2 wheels) Transfers: Sit to/from Stand Sit to Stand: Min guard           General transfer comment: Pt required min guard for safety, no physical assist required. Able to perform lateral weight shifts and 1-2 standing marches at EOB.    Ambulation/Gait Ambulation/Gait assistance: Min guard Gait Distance (Feet): 50 Feet Assistive device: Rolling walker (2 wheels) Gait Pattern/deviations: Step-to pattern Gait velocity: decreased     General Gait Details: Pt ambulated with RW and min guard, no physical assist required or overt LOB noted.  Stairs            Wheelchair Mobility    Modified Rankin (Stroke Patients Only)       Balance Overall balance assessment: Needs assistance Sitting-balance support: No upper extremity supported, Feet supported Sitting balance-Leahy Scale: Good     Standing balance support: Reliant on assistive device for balance, During functional activity, Bilateral upper extremity supported Standing balance-Leahy Scale: Poor                               Pertinent Vitals/Pain Pain Assessment Pain Assessment: 0-10 Pain Score: 4  Pain Location: left knee Pain Descriptors /  Indicators: Operative site guarding Pain Intervention(s): Limited activity within patient's tolerance, Monitored during session, Repositioned, Ice applied    Home Living Family/patient expects to be  discharged to:: Private residence Living Arrangements: Alone Available Help at Discharge: Family;Available 24 hours/day Jobe Igo, RN) Type of Home: House Home Access: Stairs to enter Entrance Stairs-Rails: Left (For garage. Front steps: bilateral railings cannot reach at same time 3 steps.) Entrance Stairs-Number of Steps: 3   Home Layout: One level Home Equipment: Conservation officer, nature (2 wheels);Cane - single point Additional Comments: Hand bike for exercises    Prior Function Prior Level of Function : Independent/Modified Independent;Driving;Working/employed             Mobility Comments: IND ADLs Comments: IND     Hand Dominance   Dominant Hand: Right    Extremity/Trunk Assessment   Upper Extremity Assessment Upper Extremity Assessment: Overall WFL for tasks assessed    Lower Extremity Assessment Lower Extremity Assessment: RLE deficits/detail;LLE deficits/detail RLE Deficits / Details: MMT ank DF/PF 5/5 RLE Sensation: WNL LLE Deficits / Details: MMT ank DF/PF 5/5, no extensor lag noted LLE Sensation: WNL    Cervical / Trunk Assessment Cervical / Trunk Assessment: Normal  Communication   Communication: No difficulties  Cognition Arousal/Alertness: Awake/alert Behavior During Therapy: WFL for tasks assessed/performed Overall Cognitive Status: Within Functional Limits for tasks assessed                                          General Comments      Exercises Total Joint Exercises Ankle Circles/Pumps: AROM, Both, 10 reps   Assessment/Plan    PT Assessment Patient needs continued PT services  PT Problem List Decreased strength;Decreased range of motion;Decreased activity tolerance;Decreased balance;Decreased mobility;Pain       PT Treatment Interventions DME instruction;Gait training;Stair training;Functional mobility training;Therapeutic activities;Therapeutic exercise;Balance training;Neuromuscular re-education;Patient/family  education    PT Goals (Current goals can be found in the Care Plan section)  Acute Rehab PT Goals Patient Stated Goal: Golf trip in Feb PT Goal Formulation: With patient Time For Goal Achievement: 02/25/22 Potential to Achieve Goals: Good    Frequency 7X/week     Co-evaluation               AM-PAC PT "6 Clicks" Mobility  Outcome Measure Help needed turning from your back to your side while in a flat bed without using bedrails?: None Help needed moving from lying on your back to sitting on the side of a flat bed without using bedrails?: A Little Help needed moving to and from a bed to a chair (including a wheelchair)?: A Little Help needed standing up from a chair using your arms (e.g., wheelchair or bedside chair)?: A Little Help needed to walk in hospital room?: A Little Help needed climbing 3-5 steps with a railing? : A Little 6 Click Score: 19    End of Session Equipment Utilized During Treatment: Gait belt Activity Tolerance: Patient tolerated treatment well Patient left: in chair;with call bell/phone within reach;with chair alarm set Nurse Communication: Mobility status;Patient requests pain meds PT Visit Diagnosis: Pain;Difficulty in walking, not elsewhere classified (R26.2) Pain - Right/Left: Left Pain - part of body: Knee    Time: 6812-7517 PT Time Calculation (min) (ACUTE ONLY): 22 min   Charges:   PT Evaluation $PT Eval Low Complexity: 1 Low          Coolidge Breeze,  PT, DPT [facility] Department Office: [phone] Weekend pager: [phone]  Coolidge Breeze 02/18/2022, 3:38 PM

## 2022-02-18 NOTE — Anesthesia Postprocedure Evaluation (Signed)
Anesthesia Post Note  Patient: Megan Savage  Procedure(s) Performed: TOTAL KNEE ARTHROPLASTY (Left: Knee)     Patient location during evaluation: PACU Anesthesia Type: Spinal Level of consciousness: awake and alert Pain management: pain level controlled Vital Signs Assessment: post-procedure vital signs reviewed and stable Respiratory status: spontaneous breathing and respiratory function stable Cardiovascular status: blood pressure returned to baseline and stable Postop Assessment: spinal receding Anesthetic complications: no   No notable events documented.  Last Vitals:  Vitals:   02/18/22 1245 02/18/22 1300  BP: (!) 122/57 124/62  Pulse: 73 74  Resp: 18 15  Temp: 36.7 C   SpO2: 95% 97%    Last Pain:  Vitals:   02/18/22 1300  TempSrc:   PainSc: 0-No pain                 Rutha Melgoza DANIEL

## 2022-02-18 NOTE — Anesthesia Procedure Notes (Signed)
Procedure Name: MAC Date/Time: 02/18/2022 10:07 AM  Performed by: Lollie Sails, CRNAPre-anesthesia Checklist: Patient identified, Emergency Drugs available, Suction available, Patient being monitored and Timeout performed Oxygen Delivery Method: Simple face mask Placement Confirmation: positive ETCO2

## 2022-02-18 NOTE — Op Note (Signed)
NAME:  Megan Savage                      MEDICAL RECORD NO.:  [id number]                             FACILITY:  [facility]      PHYSICIAN:  Pietro Cassis. Alvan Dame, M.D.  DATE OF BIRTH:  Jul 20, 1955      DATE OF PROCEDURE:  02/18/2022                                     OPERATIVE REPORT         PREOPERATIVE DIAGNOSIS:  Left knee osteoarthritis.      POSTOPERATIVE DIAGNOSIS:  Left knee osteoarthritis.      FINDINGS:  The patient was noted to have complete loss of cartilage and   bone-on-bone arthritis with associated osteophytes in the medial and patellofemoral compartments of   the knee with a significant synovitis and associated effusion.  The patient had failed months of conservative treatment including medications, injection therapy, activity modification.     PROCEDURE:  Left total knee replacement.      COMPONENTS USED:  DePuy Attune CR MS knee   system, a size 4N femur, 4 tibia, size 6 mm CR MS AOX insert, and 32 anatomic patellar   button.      SURGEON:  Pietro Cassis. Alvan Dame, M.D.      ASSISTANT:  Costella Hatcher, PA-C.      ANESTHESIA:  Regional and Spinal.      SPECIMENS:  None.      COMPLICATION:  None.      DRAINS:  None.  EBL: <100 cc      TOURNIQUET TIME:  34 min at 225 mmHg     The patient was stable to the recovery room.      INDICATION FOR PROCEDURE:  Megan Savage is a 67 y.o. female patient of   mine.  The patient had been seen, evaluated, and treated for months conservatively in the   office with medication, activity modification, and injections.  The patient had   radiographic changes of bone-on-bone arthritis with endplate sclerosis and osteophytes noted.  Based on the radiographic changes and failed conservative measures, the patient   decided to proceed with definitive treatment, total knee replacement.  Risks of infection, DVT, component failure, need for revision surgery, neurovascular injury were reviewed in the office setting.  The postop course was reviewed  stressing the efforts to maximize post-operative satisfaction and function.  Consent was obtained for benefit of pain   relief.      PROCEDURE IN DETAIL:  The patient was brought to the operative theater.   Once adequate anesthesia, preoperative antibiotics, 2 gm of Ancef,1 gm of Tranexamic Acid, and 10 mg of Decadron administered, the patient was positioned supine with a left thigh tourniquet placed.  The  left lower extremity was prepped and draped in sterile fashion.  A time-   out was performed identifying the patient, planned procedure, and the appropriate extremity.      The left lower extremity was placed in the [facility] leg holder.  The leg was   exsanguinated, tourniquet elevated to 225 mmHg.  A midline incision was   made followed by median parapatellar arthrotomy.  Following initial   exposure, attention was first  directed to the patella.  Precut   measurement was noted to be 22 mm.  I resected down to 13 mm and used a   32 anatomic patellar button to restore patellar height as well as cover the cut surface.      The lug holes were drilled and a metal shim was placed to protect the   patella from retractors and saw blade during the procedure.      At this point, attention was now directed to the femur.  The femoral   canal was opened with a drill, irrigated to try to prevent fat emboli.  An   intramedullary rod was passed at 3 degrees valgus, 9 mm of bone was   resected off the distal femur.  Following this resection, the tibia was   subluxated anteriorly.  Using the extramedullary guide, 2 mm of bone was resected off   the proximal medial tibia.  We confirmed the gap would be   stable medially and laterally with a size 5 spacer block as well as confirmed that the tibial cut was perpendicular in the coronal plane, checking with an alignment rod.      Once this was done, I sized the femur to be a size 4 in the anterior-   posterior dimension, chose a narrow component based on  medial and   lateral dimension.  The size 4 rotation block was then pinned in   position anterior referenced using the C-clamp to set rotation.  The   anterior, posterior, and  chamfer cuts were made without difficulty nor   notching making certain that I was along the anterior cortex to help   with flexion gap stability.      The final anterior shim cut was made off the lateral aspect of distal femur.      At this point, the tibia was sized to be a size 4.  The size 4 tray was   then pinned in position through the medial third of the tubercle,   drilled, and keel punched.  Trial reduction was now carried with a 4 femur,  4 tibia, a size 6 mm CR MS insert, and the 32 anatomic patella botton.  The knee was brought to full extension with good flexion stability with the patella   tracking through the trochlea without application of pressure.  Given   all these findings the trial components removed.  Final components were   opened and cement was mixed.  The knee was irrigated with normal saline solution and pulse lavage.  The synovial lining was   then injected with 30 cc of 0.25% Marcaine with epinephrine, 1 cc of Toradol and 30 cc of NS for a total of 61 cc.     Final implants were then cemented onto cleaned and dried cut surfaces of bone with the knee brought to extension with a size 6 mm CR MS trial insert.      Once the cement had fully cured, excess cement was removed   throughout the knee.  I confirmed that I was satisfied with the range of   motion and stability, and the final size 6 mm CR MS AOX insert was chosen.  It was   placed into the knee.      The tourniquet had been let down at 32 minutes.  No significant   hemostasis was required.  The extensor mechanism was then reapproximated using #1 Vicryl and #1 Stratafix sutures with the knee   in flexion.  The   remaining wound was closed with 2-0 Vicryl and running 4-0 Monocryl.   The knee was cleaned, dried, dressed sterilely using  Dermabond and   Aquacel dressing.  The patient was then   brought to recovery room in stable condition, tolerating the procedure   well.   Please note that Physician Assistant, Costella Hatcher, PA-C was present for the entirety of the case, and was utilized for pre-operative positioning, peri-operative retractor management, general facilitation of the procedure and for primary wound closure at the end of the case.              Pietro Cassis Alvan Dame, M.D.    02/18/2022 8:51 AM

## 2022-02-18 NOTE — Anesthesia Procedure Notes (Signed)
Spinal  Patient location during procedure: OR Start time: 02/18/2022 10:09 AM End time: 02/18/2022 10:11 AM Reason for block: surgical anesthesia Staffing Performed: resident/CRNA  Resident/CRNA: Lollie Sails, CRNA Performed by: Lollie Sails, CRNA Authorized by: Duane Boston, MD   Preanesthetic Checklist Completed: patient identified, IV checked, site marked, risks and benefits discussed, surgical consent, monitors and equipment checked, pre-op evaluation and timeout performed Spinal Block Patient position: sitting Prep: DuraPrep and site prepped and draped Patient monitoring: heart rate, continuous pulse ox, blood pressure and cardiac monitor Approach: midline Location: L2-3 Injection technique: single-shot Needle Needle type: Pencan  Needle gauge: 24 G Needle length: 10 cm Assessment Events: CSF return Additional Notes Expiration date on kit noted and within range.  Sterile prep and drape.    Local with Lidocaine 1%.  Good CSF flow noted with no heme or c/o paresthesia.   Patient tolerated well.

## 2022-02-19 ENCOUNTER — Encounter (HOSPITAL_COMMUNITY): Payer: Self-pay | Admitting: Orthopedic Surgery

## 2022-02-19 DIAGNOSIS — M1712 Unilateral primary osteoarthritis, left knee: Secondary | ICD-10-CM | POA: Diagnosis not present

## 2022-02-19 LAB — BASIC METABOLIC PANEL
Anion gap: 9 (ref 5–15)
BUN: 16 mg/dL (ref 8–23)
CO2: 24 mmol/L (ref 22–32)
Calcium: 8.6 mg/dL — ABNORMAL LOW (ref 8.9–10.3)
Chloride: 103 mmol/L (ref 98–111)
Creatinine, Ser: 0.7 mg/dL (ref 0.44–1.00)
GFR, Estimated: >60 mL/min (ref >60)
Glucose, Bld: 197 mg/dL — ABNORMAL HIGH (ref 70–99)
Potassium: 4.3 mmol/L (ref 3.5–5.1)
Sodium: 136 mmol/L (ref 135–145)

## 2022-02-19 LAB — CBC
HCT: 32.3 % — ABNORMAL LOW (ref 36.0–46.0)
Hemoglobin: 10.7 g/dL — ABNORMAL LOW (ref 12.0–15.0)
MCH: 30.2 pg (ref 26.0–34.0)
MCHC: 33.1 g/dL (ref 30.0–36.0)
MCV: 91.2 fL (ref 80.0–100.0)
Platelets: 260 10*3/uL (ref 150–400)
RBC: 3.54 MIL/uL — ABNORMAL LOW (ref 3.87–5.11)
RDW: 12.2 % (ref 11.5–15.5)
WBC: 11.2 10*3/uL — ABNORMAL HIGH (ref 4.0–10.5)
nRBC: 0 % (ref 0.0–0.2)

## 2022-02-19 MED ORDER — TRANEXAMIC ACID 650 MG PO TABS
1950.0000 mg | ORAL_TABLET | Freq: Every day | ORAL | Status: AC
  Administered 2022-02-19: 1950 mg via ORAL
  Filled 2022-02-19: qty 3

## 2022-02-19 NOTE — TOC Transition Note (Signed)
Transition of Care [facility]) - CM/SW Discharge Note   Patient Details  Name: ASHERAH LAVOY MRN: [id number] Date of Birth: [birth year]  [facility] CM/SW Contact:  Lennart Pall, LCSW Phone Number: 02/19/2022, 11:30 AM   Clinical Narrative:    Met briefly with pt and confirming she has needed DME in the home.  OPPT already arranged with [facility].  No TOC needs.   Final next level of care: OP Rehab Barriers to Discharge: No Barriers Identified   Patient Goals and CMS Choice      Discharge Placement                         Discharge Plan and Services Additional resources added to the After Visit Summary for                  DME Arranged: N/A DME Agency: NA       HH Arranged: NA HH Agency: NA        Social Determinants of Health (SDOH) Interventions SDOH Screenings   Food Insecurity: No Food Insecurity (02/18/2022)  Housing: Low Risk  (02/18/2022)  Transportation Needs: No Transportation Needs (02/18/2022)  Utilities: Not At Risk (02/18/2022)  Tobacco Use: Low Risk  (02/18/2022)     Readmission Risk Interventions     No data to display

## 2022-02-19 NOTE — Care Plan (Signed)
**Note Megan-Identified via Obfuscation** Ortho Bundle Case Management Note  Patient Details  Name: Megan Savage MRN: [id number] Date of Birth: 06/21/1955  L TKA on 02-18-22 DCP:  Home with cousin DME:  No needs, has a RW PT:  [facility] on 02-21-22                   DME Arranged:  N/A DME Agency:  NA  HH Arranged:  NA HH Agency:  NA  Additional Comments: Please contact me with any questions of if this plan should need to change.  Marianne Sofia, RN,CCM [facility]  [phone] 02/19/2022, 10:24 AM

## 2022-02-19 NOTE — Progress Notes (Signed)
Patient discharged to home w/ family. Given all belongings, instructions. Verbalized understanding of instructions. Escorted to pov via w/c. 

## 2022-02-19 NOTE — Progress Notes (Signed)
Physical Therapy Treatment Patient Details Name: Megan Savage MRN: [id number] DOB: [birth year] Today's Date: 02/19/2022   History of Present Illness Pt is a 67yo female presenting s/p L-TKA on 02/18/22. PMH: HLD, HTN, hypothyroidism, R-THA 2023, R-TSA 2020.    PT Comments    Pt seen POD1 received supine in bed reporting no pain and asking to don her street clothes, pt completed independently. Demonstrated modified independence for bed mobility, supervision for transfers, and supervision for ambulation in hallway with RW 182f. Pt completed stair training with min guard. Provided HEP and pt completed exercises with safe form and minimal cuing. All education completed and pt has no further questions. Pt has met mobility goals for safe discharge home, PT is signing off, should needs change please reconsult. Thank you for this referral.    Recommendations for follow up therapy are one component of a multi-disciplinary discharge planning process, led by the attending physician.  Recommendations may be updated based on patient status, additional functional criteria and insurance authorization.  Follow Up Recommendations  Follow physician's recommendations for discharge plan and follow up therapies     Assistance Recommended at Discharge Frequent or constant Supervision/Assistance  Patient can return home with the following A little help with walking and/or transfers;A little help with bathing/dressing/bathroom;Assistance with cooking/housework;Assist for transportation;Help with stairs or ramp for entrance   Equipment Recommendations  None recommended by PT    Recommendations for Other Services       Precautions / Restrictions Precautions Precautions: Knee Precaution Booklet Issued: No Precaution Comments: no pillow under the knee Restrictions Weight Bearing Restrictions: No Other Position/Activity Restrictions: wbat     Mobility  Bed Mobility Overal bed mobility: Needs  Assistance Bed Mobility: Supine to Sit     Supine to sit: Modified independent (Device/Increase time)     General bed mobility comments: Increased time    Transfers Overall transfer level: Needs assistance Equipment used: Rolling walker (2 wheels) Transfers: Sit to/from Stand Sit to Stand: Min guard           General transfer comment: Supervision for safety, demonstration of appropriate technique prior to completion of transfer    Ambulation/Gait Ambulation/Gait assistance: Supervision Gait Distance (Feet): 150 Feet Assistive device: Rolling walker (2 wheels) Gait Pattern/deviations: Step-to pattern Gait velocity: decreased     General Gait Details: Pt ambulated with RW and min guard, no physical assist required or overt LOB noted.   Stairs Stairs: Yes Stairs assistance: Min guard Stair Management: One rail Left, Step to pattern, Sideways Number of Stairs: 4 General stair comments: Pt completed stair training with min guard, multimodal cues for sequencing, no physical assist required or overt LOB noted   Wheelchair Mobility    Modified Rankin (Stroke Patients Only)       Balance Overall balance assessment: Needs assistance Sitting-balance support: No upper extremity supported, Feet supported Sitting balance-Leahy Scale: Good     Standing balance support: Reliant on assistive device for balance, During functional activity, Bilateral upper extremity supported Standing balance-Leahy Scale: Poor                              Cognition Arousal/Alertness: Awake/alert Behavior During Therapy: WFL for tasks assessed/performed Overall Cognitive Status: Within Functional Limits for tasks assessed  Exercises Total Joint Exercises Ankle Circles/Pumps: AROM, Both, 10 reps Quad Sets: AROM, Left, 10 reps Short Arc Quad: AROM, Left, 10 reps Heel Slides: AROM, Left, 10 reps Hip  ABduction/ADduction: AROM, Left, 10 reps Straight Leg Raises: AROM, Left, 10 reps Goniometric ROM: -5-90deg by gross visual approximation    General Comments        Pertinent Vitals/Pain Pain Assessment Pain Assessment: 0-10 Pain Score: 3  Pain Location: left knee Pain Descriptors / Indicators: Operative site guarding Pain Intervention(s): Monitored during session, Repositioned, Ice applied    Home Living                          Prior Function            PT Goals (current goals can now be found in the care plan section) Acute Rehab PT Goals Patient Stated Goal: Golf trip in Feb PT Goal Formulation: With patient Time For Goal Achievement: 02/25/22 Potential to Achieve Goals: Good Progress towards PT goals: Goals met/education completed, patient discharged from PT    Frequency    7X/week      PT Plan Current plan remains appropriate    Co-evaluation              AM-PAC PT "6 Clicks" Mobility   Outcome Measure  Help needed turning from your back to your side while in a flat bed without using bedrails?: None Help needed moving from lying on your back to sitting on the side of a flat bed without using bedrails?: A Little Help needed moving to and from a bed to a chair (including a wheelchair)?: A Little Help needed standing up from a chair using your arms (e.g., wheelchair or bedside chair)?: A Little Help needed to walk in hospital room?: A Little Help needed climbing 3-5 steps with a railing? : A Little 6 Click Score: 19    End of Session Equipment Utilized During Treatment: Gait belt Activity Tolerance: Patient tolerated treatment well Patient left: in chair;with call bell/phone within reach;with chair alarm set Nurse Communication: Mobility status;Patient requests pain meds PT Visit Diagnosis: Pain;Difficulty in walking, not elsewhere classified (R26.2) Pain - Right/Left: Left Pain - part of body: Knee     Time: 4270-6237 PT Time  Calculation (min) (ACUTE ONLY): 29 min  Charges:  $Gait Training: 8-22 mins $Therapeutic Exercise: 8-22 mins                     Coolidge Breeze, PT, DPT [facility] Department Office: [phone] Weekend pager: [phone]   Coolidge Breeze 02/19/2022, 10:49 AM

## 2022-02-19 NOTE — Progress Notes (Signed)
   Subjective: 1 Day Post-Op Procedure(s) (LRB): TOTAL KNEE ARTHROPLASTY (Left) Patient reports pain as mild.   Patient seen in rounds mild Dr. Alvan Dame. Patient is well, and has had no acute complaints or problems. No acute events overnight. Foley catheter removed. Patient ambulated 50 feet with PT.  We will start therapy today.   Objective: Vital signs in last 24 hours: Temp:  [97.9 F (36.6 C)-99 F (37.2 C)] 98.9 F (37.2 C) (01/24 0629) Pulse Rate:  [66-105] 95 (01/24 0629) Resp:  [12-20] 18 (01/24 0629) BP: (101-168)/(52-89) 147/74 (01/24 0629) SpO2:  [95 %-100 %] 98 % (01/24 0629) Weight:  [79.4 kg] 79.4 kg (01/23 0804)  Intake/Output from previous day:  Intake/Output Summary (Last 24 hours) at 02/19/2022 0747 Last data filed at 02/19/2022 0643 Gross per 24 hour  Intake 4035.14 ml  Output 1740 ml  Net 2295.14 ml     Intake/Output this shift: No intake/output data recorded.  Labs: Recent Labs    02/19/22 0334  HGB 10.7*   Recent Labs    02/19/22 0334  WBC 11.2*  RBC 3.54*  HCT 32.3*  PLT 260   Recent Labs    02/19/22 0334  NA 136  K 4.3  CL 103  CO2 24  BUN 16  CREATININE 0.70  GLUCOSE 197*  CALCIUM 8.6*   No results for input(s): "LABPT", "INR" in the last 72 hours.  Exam: General - Patient is Alert and Oriented Extremity - Neurologically intact Sensation intact distally Intact pulses distally Dorsiflexion/Plantar flexion intact Dressing - dressing C/D/I Motor Function - intact, moving foot and toes well on exam.   Past Medical History:  Diagnosis Date   Anemia    Arthritis    Bronchitis    Depression    denies, borther passed away   Fatigue    resolved    Heart murmur    Hypercholesterolemia    Hyperlipidemia    controlled    Hypertension    controlled   Hypothyroidism    Insomnia    Obesity    Skin disorder    rosacea    Assessment/Plan: 1 Day Post-Op Procedure(s) (LRB): TOTAL KNEE ARTHROPLASTY (Left) Principal  Problem:   S/P total knee arthroplasty, left  Estimated body mass index is 29.57 kg/m as calculated from the following:   Height as of this encounter: 5' 4.5" (1.638 m).   Weight as of this encounter: 79.4 kg. Advance diet Up with therapy D/C IV fluids   Patient's anticipated LOS is less than 2 midnights, meeting these requirements: - Younger than 68 - Lives within 1 hour of care - Has a competent adult at home to recover with post-op recover - NO history of  - Chronic pain requiring opiods  - Diabetes  - Coronary Artery Disease  - Heart failure  - Heart attack  - Stroke  - DVT/VTE  - Cardiac arrhythmia  - Respiratory Failure/COPD  - Renal failure  - Anemia  - Advanced Liver disease     DVT Prophylaxis - Aspirin Weight bearing as tolerated.  Hgb stable at 10.7 this AM.  She has all meds at home already.  Plan is to go Home after hospital stay. Plan for discharge today following 1-2 sessions of PT as long as they are meeting their goals. Patient is scheduled for OPPT. Follow up in the office in 2 weeks.   Griffith Citron, PA-C Orthopedic Surgery [phone] 02/19/2022, 7:47 AM

## 2022-02-26 ENCOUNTER — Ambulatory Visit: Payer: Medicare Other | Admitting: Podiatry

## 2022-02-27 NOTE — Discharge Summary (Signed)
Patient ID: Megan Savage MRN: [id number] DOB/AGE: 68/18/1957 68 y.o.  Admit date: 02/18/2022 Discharge date: 02/19/2022  Admission Diagnoses:  Left knee osteoarthritis  Discharge Diagnoses:  Principal Problem:   S/P total knee arthroplasty, left   Past Medical History:  Diagnosis Date   Anemia    Arthritis    Bronchitis    Depression    denies, borther passed away   Fatigue    resolved    Heart murmur    Hypercholesterolemia    Hyperlipidemia    controlled    Hypertension    controlled   Hypothyroidism    Insomnia    Obesity    Skin disorder    rosacea    Surgeries: Procedure(s): TOTAL KNEE ARTHROPLASTY on 02/18/2022   Consultants:   Discharged Condition: Improved  Hospital Course: Megan Savage is an 67 y.o. female who was admitted 02/18/2022 for operative treatment ofS/P total knee arthroplasty, left. Patient has severe unremitting pain that affects sleep, daily activities, and work/hobbies. After pre-op clearance the patient was taken to the operating room on 02/18/2022 and underwent  Procedure(s): TOTAL KNEE ARTHROPLASTY.    Patient was given perioperative antibiotics:  Anti-infectives (From admission, onward)    Start     Dose/Rate Route Frequency Ordered Stop   02/18/22 1415  ceFAZolin (ANCEF) IVPB 2g/100 mL premix        2 g 200 mL/hr over 30 Minutes Intravenous Every 6 hours 02/18/22 1327 02/18/22 2121   02/18/22 0800  ceFAZolin (ANCEF) IVPB 2g/100 mL premix        2 g 200 mL/hr over 30 Minutes Intravenous On call to O.R. 02/18/22 0758 02/18/22 1028        Patient was given sequential compression devices, early ambulation, and chemoprophylaxis to prevent DVT. Patient worked with PT and was meeting their goals regarding safe ambulation and transfers.  Patient benefited maximally from hospital stay and there were no complications.    Recent vital signs: No data found.   Recent laboratory studies: No results for input(s): "WBC", "HGB", "HCT", "PLT",  "NA", "K", "CL", "CO2", "BUN", "CREATININE", "GLUCOSE", "INR", "CALCIUM" in the last 72 hours.  Invalid input(s): "PT", "2"   Discharge Medications:   Allergies as of 02/19/2022       Reactions   Cat Hair Extract Other (See Comments)   Watery eyes        Medication List     STOP taking these medications    meloxicam 7.5 MG tablet Commonly known as: MOBIC       TAKE these medications    CALCIUM CITRATE-VITAMIN D PO Take 2 tablets by mouth 2 (two) times daily.  500 mg / 400 Units   estradiol 0.025 MG/24HR Commonly known as: VIVELLE-DOT Place 1 patch onto the skin 2 (two) times a week.   ezetimibe 10 MG tablet Commonly known as: ZETIA TAKE ONE TABLET DAILY   Fenofibric Acid 135 MG Cpdr TAKE ONE CAPSULE EACH DAY   Fish Oil 1000 MG Caps Take 2 capsules by mouth 2 (two) times daily.   levothyroxine 88 MCG tablet Commonly known as: SYNTHROID TAKE ONE TABLET DAILY BEFORE BREAKFAST   loratadine 10 MG tablet Commonly known as: CLARITIN Take 1 tablet (10 mg total) by mouth daily. What changed:  when to take this reasons to take this   multivitamin per tablet Take 1 tablet by mouth daily.   olmesartan 20 MG tablet Commonly known as: BENICAR TAKE ONE TABLET BY MOUTH ONCE DAILY  oxybutynin 10 MG 24 hr tablet Commonly known as: DITROPAN-XL Take 10 mg by mouth daily.   progesterone 100 MG capsule Commonly known as: PROMETRIUM Take 100 mg by mouth See admin instructions. Days 1-10 of every month   spironolactone 25 MG tablet Commonly known as: ALDACTONE TAKE ONE-HALF TABLET DAILY   Systane Complete PF 0.6 % Soln Generic drug: Propylene Glycol (PF) Place 1 drop into both eyes daily as needed (Dry eye).   TURMERIC CURCUMIN PO Take 500 mg by mouth daily. 1000 mg tab   vitamin C 1000 MG tablet Take 1,000 mg by mouth daily.   Vitamin D (Ergocalciferol) 1.25 MG (50000 UNIT) Caps capsule Commonly known as: DRISDOL Take 50,000 Units by mouth every 30  (thirty) days.   Vitamin D 50 MCG (2000 UT) tablet Take 2,000 Units by mouth daily.   zinc gluconate 50 MG tablet Take 50 mg by mouth daily.   ZzzQuil 50 MG/30ML Liqd Generic drug: diphenhydrAMINE HCl (Sleep) Take 30 mg by mouth at bedtime.               Discharge Care Instructions  (From admission, onward)           Start     Ordered   02/19/22 0000  Change dressing       Comments: Maintain surgical dressing until follow up in the clinic. If the edges start to pull up, may reinforce with tape. If the dressing is no longer working, may remove and cover with gauze and tape, but must keep the area dry and clean.  Call with any questions or concerns.   02/19/22 0749            Diagnostic Studies: No results found.  Disposition: Discharge disposition: 01-Home or Self Care       Discharge Instructions     Call MD / Call 911   Complete by: As directed    If you experience chest pain or shortness of breath, CALL 911 and be transported to the hospital emergency room.  If you develope a fever above 101 F, pus (white drainage) or increased drainage or redness at the wound, or calf pain, call your surgeon's office.   Change dressing   Complete by: As directed    Maintain surgical dressing until follow up in the clinic. If the edges start to pull up, may reinforce with tape. If the dressing is no longer working, may remove and cover with gauze and tape, but must keep the area dry and clean.  Call with any questions or concerns.   Constipation Prevention   Complete by: As directed    Drink plenty of fluids.  Prune juice may be helpful.  You may use a stool softener, such as Colace (over the counter) 100 mg twice a day.  Use MiraLax (over the counter) for constipation as needed.   Diet - low sodium heart healthy   Complete by: As directed    Increase activity slowly as tolerated   Complete by: As directed    Weight bearing as tolerated with assist device (walker, cane,  etc) as directed, use it as long as suggested by your surgeon or therapist, typically at least 4-6 weeks.   Post-operative opioid taper instructions:   Complete by: As directed    POST-OPERATIVE OPIOID TAPER INSTRUCTIONS: It is important to wean off of your opioid medication as soon as possible. If you do not need pain medication after your surgery it is ok to stop day one.  Opioids include: Codeine, Hydrocodone(Norco, Vicodin), Oxycodone(Percocet, oxycontin) and hydromorphone amongst others.  Long term and even short term use of opiods can cause: Increased pain response Dependence Constipation Depression Respiratory depression And more.  Withdrawal symptoms can include Flu like symptoms Nausea, vomiting And more Techniques to manage these symptoms Hydrate well Eat regular healthy meals Stay active Use relaxation techniques(deep breathing, meditating, yoga) Do Not substitute Alcohol to help with tapering If you have been on opioids for less than two weeks and do not have pain than it is ok to stop all together.  Plan to wean off of opioids This plan should start within one week post op of your joint replacement. Maintain the same interval or time between taking each dose and first decrease the dose.  Cut the total daily intake of opioids by one tablet each day Next start to increase the time between doses. The last dose that should be eliminated is the evening dose.      TED hose   Complete by: As directed    Use stockings (TED hose) for 2 weeks on both leg(s).  You may remove them at night for sleeping.        Follow-up Information     Irving Copas, PA-C. Go on 03/05/2022.   Specialty: Orthopedic Surgery Why: You are scheduled for a follow up appointment on 03-05-22 at 3:00 pm. Contact information: [address] [place] [address] [phone]                  Signed: Irving Copas 02/27/2022, 7:15 AM

## 2022-03-09 ENCOUNTER — Other Ambulatory Visit: Payer: Self-pay

## 2022-03-09 ENCOUNTER — Observation Stay (HOSPITAL_COMMUNITY): Payer: Medicare Other | Admitting: Certified Registered Nurse Anesthetist

## 2022-03-09 ENCOUNTER — Encounter (HOSPITAL_COMMUNITY): Payer: Self-pay | Admitting: Orthopedic Surgery

## 2022-03-09 ENCOUNTER — Encounter (HOSPITAL_COMMUNITY)
Admission: EM | Disposition: A | Discharge status: Home / Self Care | Payer: Self-pay | Source: Home / Self Care | Attending: Orthopedic Surgery

## 2022-03-09 ENCOUNTER — Encounter (HOSPITAL_COMMUNITY): Payer: Self-pay

## 2022-03-09 ENCOUNTER — Observation Stay (HOSPITAL_COMMUNITY)
Admission: EM | Admit: 2022-03-09 | Discharge: 2022-03-10 | Disposition: A | Discharge status: Home / Self Care | Payer: Medicare Other | Attending: Orthopedic Surgery | Admitting: Orthopedic Surgery

## 2022-03-09 ENCOUNTER — Observation Stay (HOSPITAL_BASED_OUTPATIENT_CLINIC_OR_DEPARTMENT_OTHER): Payer: Medicare Other | Admitting: Certified Registered Nurse Anesthetist

## 2022-03-09 DIAGNOSIS — Z96611 Presence of right artificial shoulder joint: Secondary | ICD-10-CM | POA: Diagnosis not present

## 2022-03-09 DIAGNOSIS — Z96652 Presence of left artificial knee joint: Secondary | ICD-10-CM | POA: Diagnosis not present

## 2022-03-09 DIAGNOSIS — Z96641 Presence of right artificial hip joint: Secondary | ICD-10-CM | POA: Insufficient documentation

## 2022-03-09 DIAGNOSIS — E039 Hypothyroidism, unspecified: Secondary | ICD-10-CM | POA: Insufficient documentation

## 2022-03-09 DIAGNOSIS — Y828 Other medical devices associated with adverse incidents: Secondary | ICD-10-CM | POA: Insufficient documentation

## 2022-03-09 DIAGNOSIS — I10 Essential (primary) hypertension: Secondary | ICD-10-CM | POA: Diagnosis not present

## 2022-03-09 DIAGNOSIS — T8130XA Disruption of wound, unspecified, initial encounter: Principal | ICD-10-CM | POA: Insufficient documentation

## 2022-03-09 DIAGNOSIS — T8131XA Disruption of external operation (surgical) wound, not elsewhere classified, initial encounter: Secondary | ICD-10-CM | POA: Diagnosis not present

## 2022-03-09 HISTORY — PX: I & D EXTREMITY: SHX5045

## 2022-03-09 SURGERY — IRRIGATION AND DEBRIDEMENT EXTREMITY
Anesthesia: General | Site: Knee | Laterality: Left

## 2022-03-09 MED ORDER — LORATADINE 10 MG PO TABS
10.0000 mg | ORAL_TABLET | Freq: Every day | ORAL | Status: DC
  Filled 2022-03-09: qty 1

## 2022-03-09 MED ORDER — SPIRONOLACTONE 12.5 MG HALF TABLET
12.5000 mg | ORAL_TABLET | Freq: Every day | ORAL | Status: DC
  Administered 2022-03-10: 12.5 mg via ORAL
  Filled 2022-03-09: qty 1

## 2022-03-09 MED ORDER — BUPIVACAINE-EPINEPHRINE 0.25% -1:200000 IJ SOLN
INTRAMUSCULAR | Status: DC | PRN
  Administered 2022-03-09: 30 mL

## 2022-03-09 MED ORDER — FENTANYL CITRATE (PF) 100 MCG/2ML IJ SOLN
INTRAMUSCULAR | Status: DC | PRN
  Administered 2022-03-09 (×2): 50 ug via INTRAVENOUS
  Administered 2022-03-09 (×2): 100 ug via INTRAVENOUS

## 2022-03-09 MED ORDER — LEVOTHYROXINE SODIUM 88 MCG PO TABS
88.0000 ug | ORAL_TABLET | Freq: Every day | ORAL | Status: DC
  Administered 2022-03-10: 88 ug via ORAL
  Filled 2022-03-09: qty 1

## 2022-03-09 MED ORDER — PHENYLEPHRINE 80 MCG/ML (10ML) SYRINGE FOR IV PUSH (FOR BLOOD PRESSURE SUPPORT)
PREFILLED_SYRINGE | INTRAVENOUS | Status: DC | PRN
  Administered 2022-03-09: 120 ug via INTRAVENOUS

## 2022-03-09 MED ORDER — DOCUSATE SODIUM 100 MG PO CAPS
100.0000 mg | ORAL_CAPSULE | Freq: Two times a day (BID) | ORAL | Status: DC
  Administered 2022-03-09 – 2022-03-10 (×2): 100 mg via ORAL
  Filled 2022-03-09 (×2): qty 1

## 2022-03-09 MED ORDER — LACTATED RINGERS IV SOLN: INTRAVENOUS | Status: DC | PRN

## 2022-03-09 MED ORDER — CEFAZOLIN SODIUM-DEXTROSE 2-4 GM/100ML-% IV SOLN
2.0000 g | Freq: Four times a day (QID) | INTRAVENOUS | Status: AC
  Administered 2022-03-09 – 2022-03-10 (×2): 2 g via INTRAVENOUS
  Filled 2022-03-09 (×2): qty 100

## 2022-03-09 MED ORDER — ONDANSETRON HCL 4 MG/2ML IJ SOLN: 4.0000 mg | Freq: Once | INTRAMUSCULAR | Status: DC | PRN

## 2022-03-09 MED ORDER — ONDANSETRON HCL 4 MG/2ML IJ SOLN
4.0000 mg | Freq: Four times a day (QID) | INTRAMUSCULAR | Status: DC | PRN
  Administered 2022-03-10: 4 mg via INTRAVENOUS
  Filled 2022-03-09: qty 2

## 2022-03-09 MED ORDER — FENTANYL CITRATE PF 50 MCG/ML IJ SOSY: 25.0000 ug | PREFILLED_SYRINGE | INTRAMUSCULAR | Status: DC | PRN

## 2022-03-09 MED ORDER — FENTANYL CITRATE (PF) 100 MCG/2ML IJ SOLN
INTRAMUSCULAR | Status: AC
  Filled 2022-03-09: qty 2

## 2022-03-09 MED ORDER — ONDANSETRON HCL 4 MG/2ML IJ SOLN
INTRAMUSCULAR | Status: DC | PRN
  Administered 2022-03-09: 4 mg via INTRAVENOUS

## 2022-03-09 MED ORDER — SUCCINYLCHOLINE CHLORIDE 200 MG/10ML IV SOSY
PREFILLED_SYRINGE | INTRAVENOUS | Status: DC | PRN
  Administered 2022-03-09: 100 mg via INTRAVENOUS

## 2022-03-09 MED ORDER — OXYCODONE HCL 5 MG PO TABS
5.0000 mg | ORAL_TABLET | ORAL | Status: DC | PRN
  Administered 2022-03-09: 10 mg via ORAL
  Administered 2022-03-10 (×2): 5 mg via ORAL
  Filled 2022-03-09: qty 2
  Filled 2022-03-09: qty 1
  Filled 2022-03-09: qty 2

## 2022-03-09 MED ORDER — PHENOL 1.4 % MT LIQD: 1.0000 | OROMUCOSAL | Status: DC | PRN

## 2022-03-09 MED ORDER — METHOCARBAMOL 1000 MG/10ML IJ SOLN: 500.0000 mg | Freq: Four times a day (QID) | INTRAVENOUS | Status: DC | PRN

## 2022-03-09 MED ORDER — EPINEPHRINE PF 1 MG/ML IJ SOLN
INTRAMUSCULAR | Status: AC
  Filled 2022-03-09: qty 1

## 2022-03-09 MED ORDER — TRANEXAMIC ACID-NACL 1000-0.7 MG/100ML-% IV SOLN
1000.0000 mg | Freq: Once | INTRAVENOUS | Status: AC
  Administered 2022-03-09: 1000 mg via INTRAVENOUS
  Filled 2022-03-09: qty 100

## 2022-03-09 MED ORDER — TRANEXAMIC ACID-NACL 1000-0.7 MG/100ML-% IV SOLN
INTRAVENOUS | Status: AC
  Filled 2022-03-09: qty 100

## 2022-03-09 MED ORDER — ROCURONIUM BROMIDE 10 MG/ML (PF) SYRINGE
PREFILLED_SYRINGE | INTRAVENOUS | Status: AC
  Filled 2022-03-09: qty 10

## 2022-03-09 MED ORDER — POLYETHYLENE GLYCOL 3350 17 G PO PACK
17.0000 g | PACK | Freq: Two times a day (BID) | ORAL | Status: DC
  Filled 2022-03-09: qty 1

## 2022-03-09 MED ORDER — HYDROMORPHONE HCL 2 MG/ML IJ SOLN
INTRAMUSCULAR | Status: AC
  Filled 2022-03-09: qty 1

## 2022-03-09 MED ORDER — HYDROMORPHONE HCL 1 MG/ML IJ SOLN
0.5000 mg | INTRAMUSCULAR | Status: DC | PRN
  Administered 2022-03-09: 1 mg via INTRAVENOUS
  Filled 2022-03-09: qty 1

## 2022-03-09 MED ORDER — EZETIMIBE 10 MG PO TABS
10.0000 mg | ORAL_TABLET | Freq: Every day | ORAL | Status: DC
  Administered 2022-03-10: 10 mg via ORAL
  Filled 2022-03-09: qty 1

## 2022-03-09 MED ORDER — DIPHENHYDRAMINE HCL 12.5 MG/5ML PO ELIX: 12.5000 mg | ORAL_SOLUTION | ORAL | Status: DC | PRN

## 2022-03-09 MED ORDER — HYDROMORPHONE HCL 1 MG/ML IJ SOLN
INTRAMUSCULAR | Status: AC
  Filled 2022-03-09: qty 1

## 2022-03-09 MED ORDER — SUCCINYLCHOLINE CHLORIDE 200 MG/10ML IV SOSY
PREFILLED_SYRINGE | INTRAVENOUS | Status: AC
  Filled 2022-03-09: qty 10

## 2022-03-09 MED ORDER — ACETAMINOPHEN 325 MG PO TABS: 325.0000 mg | ORAL_TABLET | Freq: Four times a day (QID) | ORAL | Status: DC | PRN

## 2022-03-09 MED ORDER — IRBESARTAN 150 MG PO TABS
150.0000 mg | ORAL_TABLET | Freq: Every day | ORAL | Status: DC
  Administered 2022-03-10: 150 mg via ORAL
  Filled 2022-03-09: qty 1

## 2022-03-09 MED ORDER — ROCURONIUM BROMIDE 10 MG/ML (PF) SYRINGE
PREFILLED_SYRINGE | INTRAVENOUS | Status: DC | PRN
  Administered 2022-03-09: 60 mg via INTRAVENOUS

## 2022-03-09 MED ORDER — METOCLOPRAMIDE HCL 5 MG/ML IJ SOLN
5.0000 mg | Freq: Three times a day (TID) | INTRAMUSCULAR | Status: DC | PRN
  Administered 2022-03-09: 10 mg via INTRAVENOUS
  Filled 2022-03-09: qty 2

## 2022-03-09 MED ORDER — BISACODYL 10 MG RE SUPP: 10.0000 mg | Freq: Every day | RECTAL | Status: DC | PRN

## 2022-03-09 MED ORDER — MENTHOL 3 MG MT LOZG: 1.0000 | LOZENGE | OROMUCOSAL | Status: DC | PRN

## 2022-03-09 MED ORDER — PRONTOSAN WOUND IRRIGATION OPTIME
TOPICAL | Status: DC | PRN
  Administered 2022-03-09: 1 via TOPICAL

## 2022-03-09 MED ORDER — SODIUM CHLORIDE 0.9 % IR SOLN
Status: DC | PRN
  Administered 2022-03-09: 1000 mL
  Administered 2022-03-09: 3000 mL

## 2022-03-09 MED ORDER — OXYCODONE HCL 5 MG PO TABS
10.0000 mg | ORAL_TABLET | ORAL | Status: DC | PRN
  Administered 2022-03-09: 10 mg via ORAL
  Filled 2022-03-09: qty 2

## 2022-03-09 MED ORDER — TRANEXAMIC ACID-NACL 1000-0.7 MG/100ML-% IV SOLN
1000.0000 mg | INTRAVENOUS | Status: AC
  Administered 2022-03-09: 1000 mg via INTRAVENOUS

## 2022-03-09 MED ORDER — HYDROMORPHONE HCL 1 MG/ML IJ SOLN
INTRAMUSCULAR | Status: DC | PRN
  Administered 2022-03-09 (×2): 1 mg via INTRAVENOUS

## 2022-03-09 MED ORDER — SODIUM CHLORIDE (PF) 0.9 % IJ SOLN
INTRAMUSCULAR | Status: AC
  Filled 2022-03-09: qty 50

## 2022-03-09 MED ORDER — METOCLOPRAMIDE HCL 5 MG PO TABS: 5.0000 mg | ORAL_TABLET | Freq: Three times a day (TID) | ORAL | Status: DC | PRN

## 2022-03-09 MED ORDER — CEFAZOLIN SODIUM-DEXTROSE 2-4 GM/100ML-% IV SOLN
INTRAVENOUS | Status: AC
  Filled 2022-03-09: qty 100

## 2022-03-09 MED ORDER — DEXAMETHASONE SODIUM PHOSPHATE 10 MG/ML IJ SOLN
INTRAMUSCULAR | Status: AC
  Filled 2022-03-09: qty 1

## 2022-03-09 MED ORDER — OXYBUTYNIN CHLORIDE ER 5 MG PO TB24
10.0000 mg | ORAL_TABLET | Freq: Every day | ORAL | Status: DC
  Administered 2022-03-09 – 2022-03-10 (×2): 10 mg via ORAL
  Filled 2022-03-09 (×2): qty 2

## 2022-03-09 MED ORDER — ONDANSETRON HCL 4 MG/2ML IJ SOLN
INTRAMUSCULAR | Status: AC
  Filled 2022-03-09: qty 2

## 2022-03-09 MED ORDER — ACETAMINOPHEN 500 MG PO TABS
1000.0000 mg | ORAL_TABLET | Freq: Four times a day (QID) | ORAL | Status: DC
  Administered 2022-03-09 – 2022-03-10 (×3): 1000 mg via ORAL
  Filled 2022-03-09 (×3): qty 2

## 2022-03-09 MED ORDER — PROPOFOL 10 MG/ML IV BOLUS
INTRAVENOUS | Status: DC | PRN
  Administered 2022-03-09: 180 mg via INTRAVENOUS

## 2022-03-09 MED ORDER — LIDOCAINE 2% (20 MG/ML) 5 ML SYRINGE
INTRAMUSCULAR | Status: DC | PRN
  Administered 2022-03-09: 80 mg via INTRAVENOUS

## 2022-03-09 MED ORDER — HYDROMORPHONE HCL 1 MG/ML IJ SOLN
0.5000 mg | INTRAMUSCULAR | Status: DC | PRN
  Administered 2022-03-09 (×2): 0.5 mg via INTRAVENOUS

## 2022-03-09 MED ORDER — DEXAMETHASONE SODIUM PHOSPHATE 10 MG/ML IJ SOLN: 10.0000 mg | Freq: Once | INTRAMUSCULAR | Status: DC

## 2022-03-09 MED ORDER — CELECOXIB 200 MG PO CAPS
200.0000 mg | ORAL_CAPSULE | Freq: Two times a day (BID) | ORAL | Status: DC
  Administered 2022-03-09 – 2022-03-10 (×2): 200 mg via ORAL
  Filled 2022-03-09 (×2): qty 1

## 2022-03-09 MED ORDER — DEXAMETHASONE SODIUM PHOSPHATE 4 MG/ML IJ SOLN
INTRAMUSCULAR | Status: DC | PRN
  Administered 2022-03-09: 5 mg via INTRAVENOUS

## 2022-03-09 MED ORDER — KETOROLAC TROMETHAMINE 30 MG/ML IJ SOLN
INTRAMUSCULAR | Status: AC
  Filled 2022-03-09: qty 1

## 2022-03-09 MED ORDER — ONDANSETRON HCL 4 MG PO TABS: 4.0000 mg | ORAL_TABLET | Freq: Four times a day (QID) | ORAL | Status: DC | PRN

## 2022-03-09 MED ORDER — METHOCARBAMOL 500 MG PO TABS
500.0000 mg | ORAL_TABLET | Freq: Four times a day (QID) | ORAL | Status: DC | PRN
  Administered 2022-03-09: 500 mg via ORAL
  Filled 2022-03-09: qty 1

## 2022-03-09 MED ORDER — SODIUM CHLORIDE (PF) 0.9 % IJ SOLN
INTRAMUSCULAR | Status: DC | PRN
  Administered 2022-03-09: 30 mL

## 2022-03-09 MED ORDER — SUGAMMADEX SODIUM 200 MG/2ML IV SOLN
INTRAVENOUS | Status: DC | PRN
  Administered 2022-03-09: 200 mg via INTRAVENOUS

## 2022-03-09 MED ORDER — SODIUM CHLORIDE 0.9 % IV SOLN: INTRAVENOUS | Status: DC

## 2022-03-09 MED ORDER — KETOROLAC TROMETHAMINE 30 MG/ML IJ SOLN
INTRAMUSCULAR | Status: DC | PRN
  Administered 2022-03-09: 30 mg

## 2022-03-09 MED ORDER — ASPIRIN 81 MG PO CHEW
81.0000 mg | CHEWABLE_TABLET | Freq: Two times a day (BID) | ORAL | Status: DC
  Administered 2022-03-09 – 2022-03-10 (×2): 81 mg via ORAL
  Filled 2022-03-09 (×2): qty 1

## 2022-03-09 MED ORDER — BUPIVACAINE HCL 0.25 % IJ SOLN
INTRAMUSCULAR | Status: AC
  Filled 2022-03-09: qty 1

## 2022-03-09 MED ORDER — CEFAZOLIN SODIUM-DEXTROSE 2-4 GM/100ML-% IV SOLN
2.0000 g | INTRAVENOUS | Status: AC
  Administered 2022-03-09: 2 g via INTRAVENOUS

## 2022-03-09 SURGICAL SUPPLY — 54 items
BNDG ELASTIC 4X5.8 VLCR STR LF (GAUZE/BANDAGES/DRESSINGS) ×1 IMPLANT
BNDG ELASTIC 6X5.8 VLCR STR LF (GAUZE/BANDAGES/DRESSINGS) IMPLANT
BNDG GAUZE DERMACEA FLUFF 4 (GAUZE/BANDAGES/DRESSINGS) ×1 IMPLANT
CUFF TOURN SGL QUICK 18X4 (TOURNIQUET CUFF) ×1 IMPLANT
CUFF TOURN SGL QUICK 24 (TOURNIQUET CUFF)
CUFF TOURN SGL QUICK 34 (TOURNIQUET CUFF)
CUFF TOURN SGL QUICK 42 (TOURNIQUET CUFF) IMPLANT
CUFF TRNQT CYL 24X4X16.5-23 (TOURNIQUET CUFF) IMPLANT
CUFF TRNQT CYL 34X4.125X (TOURNIQUET CUFF) IMPLANT
DERMABOND ADVANCED .7 DNX12 (GAUZE/BANDAGES/DRESSINGS) IMPLANT
DRAPE SURG 17X23 STRL (DRAPES) ×1 IMPLANT
DRAPE U-SHAPE 47X51 STRL (DRAPES) ×1 IMPLANT
DRSG AQUACEL AG ADV 3.5X10 (GAUZE/BANDAGES/DRESSINGS) IMPLANT
DRSG EMULSION OIL 3X3 NADH (GAUZE/BANDAGES/DRESSINGS) ×1 IMPLANT
ELECT REM PT RETURN 15FT ADLT (MISCELLANEOUS) IMPLANT
FACESHIELD WRAPAROUND (MASK) ×1 IMPLANT
FACESHIELD WRAPAROUND OR TEAM (MASK) ×1 IMPLANT
GAUZE PAD ABD 8X10 STRL (GAUZE/BANDAGES/DRESSINGS) ×1 IMPLANT
GAUZE SPONGE 4X4 12PLY STRL (GAUZE/BANDAGES/DRESSINGS) ×1 IMPLANT
GLOVE BIOGEL PI IND STRL 7.5 (GLOVE) ×2 IMPLANT
GLOVE BIOGEL PI IND STRL 8 (GLOVE) ×1 IMPLANT
GLOVE ECLIPSE 8.0 STRL XLNG CF (GLOVE) ×1 IMPLANT
GLOVE INDICATOR 6.5 STRL GRN (GLOVE) ×1 IMPLANT
GLOVE ORTHO TXT STRL SZ7.5 (GLOVE) ×2 IMPLANT
GLOVE SURG ORTHO 8.0 STRL STRW (GLOVE) ×1 IMPLANT
GOWN STRL REIN 3XL XLG LVL4 (GOWN DISPOSABLE) ×1 IMPLANT
GOWN STRL REUS W/ TWL LRG LVL3 (GOWN DISPOSABLE) ×4 IMPLANT
GOWN STRL REUS W/TWL LRG LVL3 (GOWN DISPOSABLE) ×4
HANDPIECE INTERPULSE COAX TIP (DISPOSABLE)
KIT BASIN OR (CUSTOM PROCEDURE TRAY) ×1 IMPLANT
KIT TURNOVER KIT A (KITS) IMPLANT
MANIFOLD NEPTUNE II (INSTRUMENTS) ×1 IMPLANT
NS IRRIG 1000ML POUR BTL (IV SOLUTION) ×1 IMPLANT
PACK ORTHO EXTREMITY (CUSTOM PROCEDURE TRAY) ×1 IMPLANT
PAD ARMBOARD 7.5X6 YLW CONV (MISCELLANEOUS) ×2 IMPLANT
PADDING CAST COTTON 6X4 STRL (CAST SUPPLIES) IMPLANT
SET HNDPC FAN SPRY TIP SCT (DISPOSABLE) IMPLANT
SPONGE T-LAP 4X18 ~~LOC~~+RFID (SPONGE) ×1 IMPLANT
STOCKINETTE 8 INCH (MISCELLANEOUS) ×1 IMPLANT
SUCTION FRAZIER HANDLE 10FR (MISCELLANEOUS)
SUCTION TUBE FRAZIER 10FR DISP (MISCELLANEOUS) IMPLANT
SUT ETHILON 3 0 PS 1 (SUTURE) ×1 IMPLANT
SUT MNCRL AB 3-0 PS2 18 (SUTURE) IMPLANT
SUT SILK 2 0 PERMA HAND 18 BK (SUTURE) ×1 IMPLANT
SUT VIC AB 1 CT1 36 (SUTURE) IMPLANT
SUT VIC AB 2-0 CT1 27 (SUTURE) ×2
SUT VIC AB 2-0 CT1 TAPERPNT 27 (SUTURE) IMPLANT
SWAB CULTURE ESWAB REG 1ML (MISCELLANEOUS) IMPLANT
TOWEL OR 17X26 10 PK STRL BLUE (TOWEL DISPOSABLE) ×1 IMPLANT
TOWEL OR NON WOVEN STRL DISP B (DISPOSABLE) ×1 IMPLANT
TUBING CONNECTING 10 (TUBING) ×1 IMPLANT
UNDERPAD 30X36 HEAVY ABSORB (UNDERPADS AND DIAPERS) ×1 IMPLANT
WATER STERILE IRR 1000ML POUR (IV SOLUTION) ×1 IMPLANT
YANKAUER SUCT BULB TIP NO VENT (SUCTIONS) ×1 IMPLANT

## 2022-03-09 NOTE — Discharge Instructions (Signed)

## 2022-03-09 NOTE — Anesthesia Procedure Notes (Signed)
Procedure Name: Intubation Date/Time: 03/09/2022 2:15 PM  Performed by: Claudia Desanctis, CRNAPre-anesthesia Checklist: Patient identified, Emergency Drugs available, Suction available and Patient being monitored Patient Re-evaluated:Patient Re-evaluated prior to induction Oxygen Delivery Method: Circle system utilized Preoxygenation: Pre-oxygenation with 100% oxygen Induction Type: IV induction, Cricoid Pressure applied and Rapid sequence Laryngoscope Size: Miller and 3 Grade View: Grade I Tube type: Oral Tube size: 7.0 mm Number of attempts: 1 Airway Equipment and Method: Stylet Placement Confirmation: ETT inserted through vocal cords under direct vision, positive ETCO2 and breath sounds checked- equal and bilateral Secured at: 21 cm Tube secured with: Tape Dental Injury: Teeth and Oropharynx as per pre-operative assessment

## 2022-03-09 NOTE — Plan of Care (Signed)
  Problem: Coping: Goal: Level of anxiety will decrease Outcome: Progressing   Problem: Pain Managment: Goal: General experience of comfort will improve Outcome: Progressing   Problem: Safety: Goal: Ability to remain free from injury will improve Outcome: Progressing   

## 2022-03-09 NOTE — Anesthesia Preprocedure Evaluation (Signed)
Anesthesia Evaluation  Patient identified by MRN, date of birth, ID band Patient awake    Reviewed: Allergy & Precautions, NPO status , Patient's Chart, lab work & pertinent test results  History of Anesthesia Complications Negative for: history of anesthetic complications  Airway Mallampati: II  TM Distance: >3 FB Neck ROM: Full    Dental  (+) Teeth Intact, Dental Advisory Given, Caps   Pulmonary asthma    Pulmonary exam normal breath sounds clear to auscultation       Cardiovascular hypertension, Pt. on medications Normal cardiovascular exam Rhythm:Regular Rate:Normal     Neuro/Psych  PSYCHIATRIC DISORDERS  Depression    negative neurological ROS     GI/Hepatic negative GI ROS, Neg liver ROS,,,  Endo/Other  Hypothyroidism    Renal/GU negative Renal ROS     Musculoskeletal  (+) Arthritis ,    Abdominal   Peds  Hematology negative hematology ROS (+)   Anesthesia Other Findings Day of surgery medications reviewed with the patient.  Reproductive/Obstetrics                              Anesthesia Physical Anesthesia Plan  ASA: 2 and emergent  Anesthesia Plan: General   Post-op Pain Management:    Induction: Intravenous, Rapid sequence and Cricoid pressure planned  PONV Risk Score and Plan: 3 and Dexamethasone and Ondansetron  Airway Management Planned: Oral ETT  Additional Equipment:   Intra-op Plan:   Post-operative Plan: Extubation in OR  Informed Consent: I have reviewed the patients History and Physical, chart, labs and discussed the procedure including the risks, benefits and alternatives for the proposed anesthesia with the patient or authorized representative who has indicated his/her understanding and acceptance.     Dental advisory given  Plan Discussed with: CRNA  Anesthesia Plan Comments:          Anesthesia Quick Evaluation

## 2022-03-09 NOTE — Op Note (Unsigned)
NAME: Megan Savage, Megan Savage MEDICAL RECORD NO: [id number] ACCOUNT NO: [id number] DATE OF BIRTH: 11/28/1955 FACILITY: [facility] LOCATION: [id number] PHYSICIAN: Pietro Cassis. Alvan Dame, MD  Operative Report   DATE OF PROCEDURE: 03/09/2022  PREOPERATIVE DIAGNOSIS:  Left surgical knee wound dehiscence.  POSTOPERATIVE DIAGNOSIS:  Left surgical knee wound dehiscence.  FINDINGS:  Please see dictated operative note for findings.  PROCEDURE: 1.  Excisional debridement of right knee including nonviable subcutaneous tissue as well as removal of sutures. 2.  Non-excisional debridement with 3 liters of normal saline solution as well as a 400 mL bottle of Prontosan antimicrobial solution. 3.  Primary wound closure.  SURGEON:  Pietro Cassis. Alvan Dame, MD  ASSISTANT:  Costella Hatcher, PA-C.  Note, Ms. Megan Savage was present for the entirety of the case from preoperative positioning, perioperative management of the operative extremity, general facilitation of the case and primary wound closure.  ANESTHESIA:  General LMA.  BLOOD LOSS:  Less than 200 mL  DRAINS:  None.  TOURNIQUET:  Up for 15 minutes at 200 mmHg.  INDICATIONS:  The patient is a very pleasant 67 year old female now about 3 weeks out from her left total knee replacement.  She has been progressing fairly well.  She was seen and evaluated in the office at her 2-week visit and had progressed to walking  without assistive device.  She was at her home when she was trying to do some things in her garage when she stumbled over the cover of her grill.  She hyperflexed her knee and she fell to the ground and noted immediate opening of her wound with  bleeding.  She contacted me directly and subsequently EMS.  She was seen at her home in her garage and then brought to the [facility] by EMS for management.  She stated there was a lot of blood.  The knee was wrapped by the time I saw.  She was  concerned that she saw her knee implant.  The necessity of the procedure was reviewed.   Consent was obtained for management of her wound.  I will plan on her being admitted for observation for antibiotics as well as most likely a postoperative  antibiotics for seven to ten days.  DESCRIPTION OF PROCEDURE:  The patient was brought to the operative theater.  Once adequate anesthesia, preoperative antibiotics, Ancef, 2 grams administered as well as tranexamic acid, she was positioned supine with a left thigh tourniquet placed.  Left  lower extremity was then prepped and draped in sterile fashion.  We used a Betadine scrub and paint to clean her wound.  As we opened up her dressing from the field we identified that she had opened up her entire incision site.  It was unclear whether  or not there was dehiscence of her extensor mechanism at this point. Following the timeout, identifying the patient, planned procedure, and extremity the leg was exsanguinated and tourniquet elevated to 200 mmHg.  Once this was done, we evacuated  hematoma from the knee.  I was then able to evaluate and identify that her extensor mechanism was intact.  There was no dehiscence and no evidence of quadriceps or patellar tendon disruption.  Given these findings, we finalized our debridement sharply of  the subcutaneous nonviable tissue and any sutures that we saw.  We then irrigated the knee with 3 liters of normal saline solution followed by the Prontosan solution.  At this point, I evaluated the knee and found that there was a lot of punctate  bleeding.  Any hemostasis that was required was done.  We let the tourniquet down.  I determined that no Hemovac drain was necessary as her knee wound appeared to be relatively stable despite this punctate bleeding.  At this point, her subcutaneous layer  was closed with 2-0 Vicryl followed by running Monocryl stitch.  The knee wound was clean, dry and dressed sterilely using surgical glue and Aquacel dressing.  Her knee was wrapped into a sterile compressive Ace wrap dressing.   Again, she will be  admitted to the hospital from the PACU for overnight observation and antibiotics.  We will discharge her likely tomorrow.   SHY D: 03/09/2022 3:09:17 pm T: 03/09/2022 5:40:00 pm  JOB: [id number]

## 2022-03-09 NOTE — Anesthesia Postprocedure Evaluation (Signed)
Anesthesia Post Note  Patient: Megan Savage  Procedure(s) Performed: IRRIGATION AND DEBRIDEMENT LEFT KNEE WITH PRIMARY CLOSURE (Left: Knee)     Patient location during evaluation: PACU Anesthesia Type: General Level of consciousness: awake and alert Pain management: pain level controlled Vital Signs Assessment: post-procedure vital signs reviewed and stable Respiratory status: spontaneous breathing, nonlabored ventilation, respiratory function stable and patient connected to nasal cannula oxygen Cardiovascular status: blood pressure returned to baseline and stable Postop Assessment: no apparent nausea or vomiting Anesthetic complications: no   No notable events documented.  Last Vitals:  Vitals:   03/09/22 1631 03/09/22 1717  BP:  (!) 143/60  Pulse:  87  Resp:  18  Temp:  36.5 C  SpO2: 95% 97%    Last Pain:  Vitals:   03/09/22 1746  TempSrc:   PainSc: 0-No pain    LLE Motor Response: Purposeful movement (03/09/22 1746) LLE Sensation: Full sensation;Pain (03/09/22 1746) RLE Motor Response: Purposeful movement (03/09/22 1746) RLE Sensation: Full sensation (03/09/22 1746) L Sensory Level: S1-Sole of foot, small toes (03/09/22 1746) R Sensory Level: S1-Sole of foot, small toes (03/09/22 1746)  Santa Lighter

## 2022-03-09 NOTE — Brief Op Note (Signed)
03/09/2022  1:58 PM  PATIENT:  Megan Savage  67 y.o. female  PRE-OPERATIVE DIAGNOSIS:  LEFT KNEE WOUND DEHISCENCE  POST-OPERATIVE DIAGNOSIS:  LEFT KNEE WOUND DEHISCENCE  PROCEDURE:  Procedure(s): IRRIGATION AND DEBRIDEMENT LEFT KNEE WITH PRIMARY CLOSURE (Left)  SURGEON:  Surgeon(s) and Role:    Paralee Cancel, MD - Primary  PHYSICIAN ASSISTANT: Costella Hatcher, PA-C  ANESTHESIA:   general  EBL:  <200 cc  BLOOD ADMINISTERED:none  DRAINS: none   LOCAL MEDICATIONS USED:  marcaine  SPECIMEN:  No Specimen  DISPOSITION OF SPECIMEN:  N/A  COUNTS:  YES  TOURNIQUET:  15 min at 200 mmHg  DICTATION: .Other Dictation: Dictation Number [id number]  PLAN OF CARE: Admit for overnight observation  PATIENT DISPOSITION:  PACU - hemodynamically stable.   Delay start of Pharmacological VTE agent (>24hrs) due to surgical blood loss or risk of bleeding: no

## 2022-03-09 NOTE — Transfer of Care (Signed)
Immediate Anesthesia Transfer of Care Note  Patient: Megan Savage  Procedure(s) Performed: IRRIGATION AND DEBRIDEMENT LEFT KNEE WITH PRIMARY CLOSURE (Left: Knee)  Patient Location: PACU  Anesthesia Type:General  Level of Consciousness: awake, alert , oriented, and patient cooperative  Airway & Oxygen Therapy: Patient Spontanous Breathing and Patient connected to face mask  Post-op Assessment: Report given to RN and Post -op Vital signs reviewed and stable  Post vital signs: Reviewed and stable  Last Vitals:  Vitals Value Taken Time  BP    Temp    Pulse 87 03/09/22 1526  Resp 16 03/09/22 1526  SpO2 99 % 03/09/22 1526  Vitals shown include unvalidated device data.  Last Pain: There were no vitals filed for this visit.       Complications: No notable events documented.

## 2022-03-09 NOTE — H&P (Signed)
Megan Savage is an 67 y.o. female.    Chief Complaint: left knee wound dehiscence    HPI: Pt is a 67 y.o. female with history of left total knee replacement on 02/18/2022.  She has been doing great and was seen in the office for her 2-week visit progressing well ambulating without assist device and progressing well with her physical therapy. Unfortunately today she was walking in her garage and due to the dampness resulting in a slippery garage surface she slipped and fell.  She noted immediate wound dehiscence with blood likely related to old blood within her knee in combination with new blood.  She contacted me directly as she is a good family friend.  She also contacted EMS.   Plans were made for her to be directly admitted to me for observation with plans to go to the operating room today for I&D and wound closure. No other injuries reported.  PCP:  Shon Baton, MD  D/C Plans: To be determined following appropriate treatment plan  PMH: Past Medical History:  Diagnosis Date   Anemia    Arthritis    Bronchitis    Depression    denies, borther passed away   Fatigue    resolved    Heart murmur    Hypercholesterolemia    Hyperlipidemia    controlled    Hypertension    controlled   Hypothyroidism    Insomnia    Obesity    Skin disorder    rosacea    PSH: Past Surgical History:  Procedure Laterality Date   APPENDECTOMY  1976   BREAST IMPLANT REMOVAL Bilateral    implanted 1996 removed 2002   CARPAL TUNNEL RELEASE     bilateral    ENDOMETRIAL FULGURATION  1992   foot surgery      tumor removed (left)    HYSTEROSCOPY WITH D & C N/A 06/15/2018   Procedure: DILATATION AND CURETTAGE /HYSTEROSCOPY;  Surgeon: Dian Queen, MD;  Location: [facility];  Service: Gynecology;  Laterality: N/A;   hysterscopy      OOPHORECTOMY  1976   left ovary removed    TOTAL HIP ARTHROPLASTY Right 08/27/2021   Procedure: TOTAL HIP ARTHROPLASTY ANTERIOR APPROACH;  Surgeon: Paralee Cancel, MD;  Location: [facility];  Service: Orthopedics;  Laterality: Right;   TOTAL KNEE ARTHROPLASTY Left 02/18/2022   Procedure: TOTAL KNEE ARTHROPLASTY;  Surgeon: Paralee Cancel, MD;  Location: [facility];  Service: Orthopedics;  Laterality: Left;   TOTAL SHOULDER ARTHROPLASTY Right 09/17/2018   Procedure: TOTAL SHOULDER ARTHROPLASTY anatomical;  Surgeon: Netta Cedars, MD;  Location: [facility];  Service: Orthopedics;  Laterality: Right;    Social History:  reports that she has never smoked. She has never used smokeless tobacco. She reports current alcohol use. She reports that she does not use drugs.  Allergies:  Allergies  Allergen Reactions   Cat Hair Extract Other (See Comments)    Watery eyes    Medications: No medications prior to admission.    No results found for this or any previous visit (from the past 48 hour(s)). No results found.  ROS: Review of Systems - Negative except for that noted in her HPI including her recent left knee replacement and history of right total hip replacement  Physican Exam: Pleasant 67 year old female awake alert and oriented.  She is in no acute distress other than the after mentioned injury to the left knee. Chest is clear without wheezing Heart is in normal rhythm and rate  No abdominal pain or tenderness  Left knee exam: Her surgical incision has dehisced with concerns for disruption of her extensor mechanism likely through the arthrotomy. Mild lower extremity edema without erythema   Assessment/Plan Assessment: Recent history of left total knee replacement with slip and fall and left knee wound dehiscence  Plan: We will plan to take her to the operating room to perform a formal I&D of her left knee.  She will receive preoperative antibiotics as well as postoperative antibiotics with plan to stay in the hospital overnight. Risks and benefits of the procedure known and reviewed. Consent ordered.   Pietro Cassis Alvan Dame, MD  03/09/2022, 1:12 PM

## 2022-03-09 NOTE — Plan of Care (Signed)

## 2022-03-10 ENCOUNTER — Encounter (HOSPITAL_COMMUNITY): Payer: Self-pay | Admitting: Orthopedic Surgery

## 2022-03-10 DIAGNOSIS — T8130XA Disruption of wound, unspecified, initial encounter: Secondary | ICD-10-CM | POA: Diagnosis not present

## 2022-03-10 LAB — BASIC METABOLIC PANEL
Anion gap: 9 (ref 5–15)
BUN: 20 mg/dL (ref 8–23)
CO2: 23 mmol/L (ref 22–32)
Calcium: 8.5 mg/dL — ABNORMAL LOW (ref 8.9–10.3)
Chloride: 103 mmol/L (ref 98–111)
Creatinine, Ser: 0.74 mg/dL (ref 0.44–1.00)
GFR, Estimated: >60 mL/min (ref >60)
Glucose, Bld: 256 mg/dL — ABNORMAL HIGH (ref 70–99)
Potassium: 4.4 mmol/L (ref 3.5–5.1)
Sodium: 135 mmol/L (ref 135–145)

## 2022-03-10 LAB — CBC
HCT: 27.6 % — ABNORMAL LOW (ref 36.0–46.0)
Hemoglobin: 8.8 g/dL — ABNORMAL LOW (ref 12.0–15.0)
MCH: 30 pg (ref 26.0–34.0)
MCHC: 31.9 g/dL (ref 30.0–36.0)
MCV: 94.2 fL (ref 80.0–100.0)
Platelets: 308 10*3/uL (ref 150–400)
RBC: 2.93 MIL/uL — ABNORMAL LOW (ref 3.87–5.11)
RDW: 12.7 % (ref 11.5–15.5)
WBC: 7.8 10*3/uL (ref 4.0–10.5)
nRBC: 0 % (ref 0.0–0.2)

## 2022-03-10 MED ORDER — CEFADROXIL 500 MG PO CAPS
500.0000 mg | ORAL_CAPSULE | Freq: Two times a day (BID) | ORAL | Status: DC
  Administered 2022-03-10: 500 mg via ORAL
  Filled 2022-03-10: qty 1

## 2022-03-10 MED ORDER — CELECOXIB 200 MG PO CAPS: 200.0000 mg | ORAL_CAPSULE | Freq: Two times a day (BID) | ORAL | 0 refills | Status: DC

## 2022-03-10 MED ORDER — CEFADROXIL 500 MG PO CAPS: 500.0000 mg | ORAL_CAPSULE | Freq: Two times a day (BID) | ORAL | 0 refills | Status: AC

## 2022-03-10 MED ORDER — OXYCODONE HCL 5 MG PO TABS: 5.0000 mg | ORAL_TABLET | ORAL | 0 refills | Status: DC | PRN

## 2022-03-10 MED ORDER — POLYETHYLENE GLYCOL 3350 17 G PO PACK: 17.0000 g | PACK | Freq: Two times a day (BID) | ORAL | 0 refills | Status: DC

## 2022-03-10 MED ORDER — METHOCARBAMOL 500 MG PO TABS: 500.0000 mg | ORAL_TABLET | Freq: Four times a day (QID) | ORAL | 1 refills | Status: DC | PRN

## 2022-03-10 MED ORDER — ASPIRIN 81 MG PO CHEW: 81.0000 mg | CHEWABLE_TABLET | Freq: Two times a day (BID) | ORAL | 0 refills | Status: AC

## 2022-03-10 NOTE — TOC Transition Note (Signed)
Transition of Care [facility]) - CM/SW Discharge Note  Patient Details  Name: Megan Savage MRN: [id number] Date of Birth: 1955/10/30  [facility]) CM/SW Contact:  Sherie Don, LCSW Phone Number: 03/10/2022, 10:40 AM  Clinical Narrative: Patient is expected to discharge home after working with PT. CSW met with patient to confirm discharge plan. Patient will resume OPPT at [facility] after discharge and has her DME from previous surgeries at home. Patient will discharge home on PO antibiotics. TOC signing off.  Final next level of care: OP Rehab Barriers to Discharge: No Barriers Identified  Patient Goals and CMS Choice Choice offered to / list presented to : NA  Discharge Plan and Services Additional resources added to the After Visit Summary for         DME Arranged: N/A DME Agency: NA  Social Determinants of Health (SDOH) Interventions SDOH Screenings   Food Insecurity: No Food Insecurity (03/09/2022)  Housing: Low Risk  (03/09/2022)  Transportation Needs: No Transportation Needs (03/09/2022)  Utilities: Not At Risk (03/09/2022)  Tobacco Use: Low Risk  (03/10/2022)   Readmission Risk Interventions     No data to display

## 2022-03-10 NOTE — Evaluation (Signed)
Physical Therapy Evaluation Patient Details Name: Megan Savage MRN: [id number] DOB: [birth year] Today's Date: 03/10/2022  History of Present Illness  Pt is a 67yo female  admitted with Left surgical knee wound dehiscence after fall on to knee. s/p IRRIGATION AND DEBRIDEMENT LEFT KNEE WITH PRIMARY CLOSURE 03/09/22. PMH: L-TKA on 02/18/22,HLD, HTN, hypothyroidism, R-THA 2023, R-TSA 2020.  Clinical Impression  Patient evaluated by Physical Therapy with no further acute PT needs identified. All education has been completed and the patient has no further questions.  Pt known to this PT from previous admissions, doing very well this date. See below for mobility. Pt to have friends retrieve her DME from upstairs. Pt feels ready to d/c home, PT in agreement, advised to use RW until OPPT advises otherwise   See below for any follow-up Physical Therapy or equipment needs. PT is signing off. Thank you for this referral.        Recommendations for follow up therapy are one component of a multi-disciplinary discharge planning process, led by the attending physician.  Recommendations may be updated based on patient status, additional functional criteria and insurance authorization.  Follow Up Recommendations Follow physician's recommendations for discharge plan and follow up therapies (OPPT)      Assistance Recommended at Discharge    Patient can return home with the following  Help with stairs or ramp for entrance;Assistance with cooking/housework    Equipment Recommendations None recommended by PT  Recommendations for Other Services       Functional Status Assessment Patient has not had a recent decline in their functional status     Precautions / Restrictions Precautions Precautions: Knee Precaution Comments: no pillow under the knee Restrictions Weight Bearing Restrictions: No LLE Weight Bearing: Weight bearing as tolerated      Mobility  Bed Mobility Overal bed mobility: Needs  Assistance Bed Mobility: Supine to Sit     Supine to sit: Modified independent (Device/Increase time)          Transfers   Equipment used: Rolling walker (2 wheels) Transfers: Sit to/from Stand Sit to Stand: Supervision           General transfer comment: cues for hand placement    Ambulation/Gait Ambulation/Gait assistance: Supervision Gait Distance (Feet): 300 Feet Assistive device: Rolling walker (2 wheels) Gait Pattern/deviations: Step-through pattern       General Gait Details: Pt ambulated with RW and supervision for safety, no physical assist required or overt LOB noted.  Stairs         General stair comments: reviewed technique,  pt verbalized. declines practice as familiar from other surgeries  Wheelchair Mobility    Modified Rankin (Stroke Patients Only)       Balance     Sitting balance-Leahy Scale: Good     Standing balance support: During functional activity, Reliant on assistive device for balance (reliant on device for dynamic gait) Standing balance-Leahy Scale: Fair                               Pertinent Vitals/Pain Pain Assessment Pain Assessment: 0-10 Pain Score: 1  Pain Location: left knee Pain Descriptors / Indicators: Sore Pain Intervention(s): Limited activity within patient's tolerance, Monitored during session, Premedicated before session, Repositioned, Ice applied    Home Living Family/patient expects to be discharged to:: Private residence Living Arrangements: Alone Available Help at Discharge: Family;Available 24 hours/day Type of Home: House Home Access: Stairs to enter Entrance Stairs-Rails: Left  Entrance Stairs-Number of Steps: 3   Home Layout: One level Home Equipment: Conservation officer, nature (2 wheels);Cane - single point      Prior Function Prior Level of Function : Independent/Modified Independent;Driving;Working/employed             Mobility Comments: pt reports being back to IND, not using  device, stillgoing to OPPT 2x/wk       Hand Dominance        Extremity/Trunk Assessment   Upper Extremity Assessment Upper Extremity Assessment: Overall WFL for tasks assessed    Lower Extremity Assessment RLE Deficits / Details: grossly WFL LLE Deficits / Details: MMT ank DF/PF 5/5, no extensor lag noted with SLR, no overpressure d/t wound dehiscence/closure 03/09/22       Communication   Communication: No difficulties  Cognition Arousal/Alertness: Awake/alert Behavior During Therapy: WFL for tasks assessed/performed Overall Cognitive Status: Within Functional Limits for tasks assessed                                          General Comments      Exercises Total Joint Exercises Ankle Circles/Pumps: AROM, Both, 10 reps Quad Sets: AROM, Both, 5 reps Heel Slides: AROM, Left, 15 reps, AAROM Straight Leg Raises: AROM, Left, 10 reps Long Arc Quad: AROM, Left, 5 reps, Seated   Assessment/Plan    PT Assessment All further PT needs can be met in the next venue of care  PT Problem List         PT Treatment Interventions      PT Goals (Current goals can be found in the Care Plan section)  Acute Rehab PT Goals PT Goal Formulation: All assessment and education complete, DC therapy    Frequency       Co-evaluation               AM-PAC PT "6 Clicks" Mobility  Outcome Measure Help needed turning from your back to your side while in a flat bed without using bedrails?: None Help needed moving from lying on your back to sitting on the side of a flat bed without using bedrails?: None Help needed moving to and from a bed to a chair (including a wheelchair)?: None Help needed standing up from a chair using your arms (e.g., wheelchair or bedside chair)?: None Help needed to walk in hospital room?: None Help needed climbing 3-5 steps with a railing? : A Little 6 Click Score: 23    End of Session Equipment Utilized During Treatment: Gait  belt Activity Tolerance: Patient tolerated treatment well Patient left: with call bell/phone within reach;in chair   PT Visit Diagnosis: Difficulty in walking, not elsewhere classified (R26.2)    Time: [id number] PT Time Calculation (min) (ACUTE ONLY): 18 min   Charges:   PT Evaluation $PT Eval Low Complexity: [place], PT  Acute Rehab Dept [facility]) [phone]  [facility] Weekend Pager [facility] only)  [phone]  03/10/2022   [facility] 03/10/2022, 10:13 AM

## 2022-03-10 NOTE — Progress Notes (Signed)
   Subjective: 1 Day Post-Op Procedure(s) (LRB): IRRIGATION AND DEBRIDEMENT LEFT KNEE WITH PRIMARY CLOSURE (Left) Patient reports pain as mild.   Patient seen in rounds by Dr. Alvan Dame. Patient is well, and has had no acute complaints or problems. No acute events overnight. Has not been up with PT yet. We will start therapy today.   Objective: Vital signs in last 24 hours: Temp:  [97.7 F (36.5 C)-98.6 F (37 C)] 98.6 F (37 C) (02/12 0553) Pulse Rate:  [87-106] 104 (02/12 0553) Resp:  [14-20] 20 (02/12 0553) BP: (126-161)/(60-89) 126/74 (02/12 0553) SpO2:  [92 %-100 %] 96 % (02/12 0553) Weight:  [79.4 kg] 79.4 kg (02/11 1717)  Intake/Output from previous day:  Intake/Output Summary (Last 24 hours) at 03/10/2022 0819 Last data filed at 03/10/2022 0600 Gross per 24 hour  Intake 2490.95 ml  Output 50 ml  Net 2440.95 ml     Intake/Output this shift: No intake/output data recorded.  Labs: Recent Labs    03/10/22 0314  HGB 8.8*   Recent Labs    03/10/22 0314  WBC 7.8  RBC 2.93*  HCT 27.6*  PLT 308   Recent Labs    03/10/22 0314  NA 135  K 4.4  CL 103  CO2 23  BUN 20  CREATININE 0.74  GLUCOSE 256*  CALCIUM 8.5*   No results for input(s): "LABPT", "INR" in the last 72 hours.  Exam: General - Patient is Alert and Oriented Extremity - Neurologically intact Sensation intact distally Intact pulses distally Dorsiflexion/Plantar flexion intact Dressing - dressing C/D/I Motor Function - intact, moving foot and toes well on exam.   Past Medical History:  Diagnosis Date   Anemia    Arthritis    Bronchitis    Depression    denies, borther passed away   Fatigue    resolved    Heart murmur    Hypercholesterolemia    Hyperlipidemia    controlled    Hypertension    controlled   Hypothyroidism    Insomnia    Obesity    Skin disorder    rosacea    Assessment/Plan: 1 Day Post-Op Procedure(s) (LRB): IRRIGATION AND DEBRIDEMENT LEFT KNEE WITH PRIMARY  CLOSURE (Left) Principal Problem:   Postoperative wound dehiscence, initial encounter Active Problems:   Wound dehiscence, surgical, initial encounter  Estimated body mass index is 29.57 kg/m as calculated from the following:   Height as of this encounter: 5' 4.5" (1.638 m).   Weight as of this encounter: 79.4 kg. Advance diet Up with therapy D/C IV fluids   DVT Prophylaxis - Aspirin Weight bearing as tolerated.  Hgb stable at 8.8 this AM. Will need to be on cefadroxil x2 weeks prophylactically.  Plan is to go Home after hospital stay. Plan for discharge today following 1-2 sessions of PT as long as they are meeting their goals. Patient is scheduled for OPPT. Follow up in the office in 2 weeks.   Griffith Citron, PA-C Orthopedic Surgery [phone] 03/10/2022, 8:19 AM

## 2022-03-14 ENCOUNTER — Ambulatory Visit
Admission: RE | Admit: 2022-03-14 | Discharge: 2022-03-14 | Disposition: A | Discharge status: Home / Self Care | Payer: Medicare Other | Source: Ambulatory Visit | Attending: Obstetrics and Gynecology | Admitting: Obstetrics and Gynecology

## 2022-03-14 DIAGNOSIS — Z1231 Encounter for screening mammogram for malignant neoplasm of breast: Secondary | ICD-10-CM

## 2022-03-19 ENCOUNTER — Other Ambulatory Visit: Payer: Self-pay | Admitting: Obstetrics and Gynecology

## 2022-03-19 ENCOUNTER — Encounter: Payer: Self-pay | Admitting: Podiatry

## 2022-03-19 ENCOUNTER — Ambulatory Visit (INDEPENDENT_AMBULATORY_CARE_PROVIDER_SITE_OTHER): Payer: Medicare Other | Admitting: Podiatry

## 2022-03-19 DIAGNOSIS — L600 Ingrowing nail: Secondary | ICD-10-CM

## 2022-03-19 DIAGNOSIS — R928 Other abnormal and inconclusive findings on diagnostic imaging of breast: Secondary | ICD-10-CM

## 2022-03-19 NOTE — Progress Notes (Signed)
Subjective:   Patient ID: Megan Savage, female   DOB: 67 y.o.   MRN: [id number]   HPI Patient presents with a thickened left hallux nail that is becoming loose and sore.  Has had knee replacement done in the last 6 weeks and did have a fall and had to have it repaired again but it is doing well   ROS      Objective:  Physical Exam  Neurovascular status intact with patient found to have a loose thick dystrophic big toenail left that is tender when I pressed from a dorsal direction.  No drainage no redness     Assessment:  Ingrown bone damaged hallux nail left with pain     Plan:  Reviewed with patient I do think it should be removed permanently but I do not want to do it now due to the history of the knee and I would rather wait 4 weeks and let recovery occur within the knee.  Patient agrees with me and at this point will be seen back for [name] to recheck again and is encouraged to call with any questions

## 2022-03-25 NOTE — Discharge Summary (Signed)
Patient ID: Megan Savage MRN: [id number] DOB/AGE: Oct 16, 1955 67 y.o.  Admit date: 03/09/2022 Discharge date: 03/10/2022  Admission Diagnoses:  Postoperative wound dehiscence  Discharge Diagnoses:  Principal Problem:   Postoperative wound dehiscence, initial encounter Active Problems:   Wound dehiscence, surgical, initial encounter   Past Medical History:  Diagnosis Date   Anemia    Arthritis    Bronchitis    Depression    denies, borther passed away   Fatigue    resolved    Heart murmur    Hypercholesterolemia    Hyperlipidemia    controlled    Hypertension    controlled   Hypothyroidism    Insomnia    Obesity    Skin disorder    rosacea    Surgeries: Procedure(s): IRRIGATION AND DEBRIDEMENT LEFT KNEE WITH PRIMARY CLOSURE on 03/09/2022   Consultants:   Discharged Condition: Improved  Hospital Course: Megan Savage is an 67 y.o. female who was admitted 03/09/2022 for operative treatment ofPostoperative wound dehiscence, initial encounter. Patient has severe unremitting pain that affects sleep, daily activities, and work/hobbies. After pre-op clearance the patient was taken to the operating room on 03/09/2022 and underwent  Procedure(s): IRRIGATION AND DEBRIDEMENT LEFT KNEE WITH PRIMARY CLOSURE.    Patient was given perioperative antibiotics:  Anti-infectives (From admission, onward)    Start     Dose/Rate Route Frequency Ordered Stop   03/10/22 1000  cefadroxil (DURICEF) capsule 500 mg  Status:  Discontinued        500 mg Oral 2 times daily 03/10/22 0820 03/10/22 1622   03/10/22 0000  cefadroxil (DURICEF) 500 MG capsule        500 mg Oral 2 times daily 03/10/22 0823 03/24/22 2359   03/09/22 2000  ceFAZolin (ANCEF) IVPB 2g/100 mL premix        2 g 200 mL/hr over 30 Minutes Intravenous Every 6 hours 03/09/22 1613 03/10/22 0150   03/09/22 1345  ceFAZolin (ANCEF) IVPB 2g/100 mL premix        2 g 200 mL/hr over 30 Minutes Intravenous On call to O.R. 03/09/22 1330  03/09/22 1424   03/09/22 1334  ceFAZolin (ANCEF) 2-4 GM/100ML-% IVPB       Note to Pharmacy: Dara Lords M: cabinet override      03/09/22 1334 03/09/22 1434        Patient was given sequential compression devices, early ambulation, and chemoprophylaxis to prevent DVT. Patient worked with PT and was meeting their goals regarding safe ambulation and transfers.  Patient benefited maximally from hospital stay and there were no complications.    Recent vital signs: No data found.   Recent laboratory studies: No results for input(s): "WBC", "HGB", "HCT", "PLT", "NA", "K", "CL", "CO2", "BUN", "CREATININE", "GLUCOSE", "INR", "CALCIUM" in the last 72 hours.  Invalid input(s): "PT", "2"   Discharge Medications:   Allergies as of 03/10/2022       Reactions   Cat Hair Extract Other (See Comments)   Watery eyes   Other Other (See Comments)   Patient was INTUBATED and received GAS for a procedure- left her feeling WOOZY afterwards        Medication List     TAKE these medications    aspirin 81 MG chewable tablet Chew 1 tablet (81 mg total) by mouth 2 (two) times daily for 28 days.   CALCIUM CITRATE-VITAMIN D PO Take 2 tablets by mouth daily with breakfast.  500 mg / 400 Units   celecoxib 200 MG capsule  Commonly known as: CELEBREX Take 1 capsule (200 mg total) by mouth 2 (two) times daily.   estradiol 0.025 MG/24HR Commonly known as: VIVELLE-DOT Place 1 patch onto the skin See admin instructions.  Place 1 patch onto the skin on Tuesdays and Fridays- after removing the old one   ezetimibe 10 MG tablet Commonly known as: ZETIA TAKE ONE TABLET DAILY   Fenofibric Acid 135 MG Cpdr TAKE ONE CAPSULE EACH DAY What changed: See the new instructions.   Fish Oil 1000 MG Caps Take 2,000 mg by mouth 2 (two) times daily.   levothyroxine 88 MCG tablet Commonly known as: SYNTHROID TAKE ONE TABLET DAILY BEFORE BREAKFAST What changed: See the new instructions.   loratadine 10 MG  tablet Commonly known as: CLARITIN Take 1 tablet (10 mg total) by mouth daily. What changed:  when to take this reasons to take this   methocarbamol 500 MG tablet Commonly known as: ROBAXIN Take 1 tablet (500 mg total) by mouth every 6 (six) hours as needed for muscle spasms. What changed:  when to take this reasons to take this additional instructions   multivitamin per tablet Take 1 tablet by mouth daily with breakfast.   olmesartan 20 MG tablet Commonly known as: BENICAR TAKE ONE TABLET BY MOUTH ONCE DAILY   oxybutynin 10 MG 24 hr tablet Commonly known as: DITROPAN-XL Take 10 mg by mouth at bedtime.   oxyCODONE 5 MG immediate release tablet Commonly known as: Oxy IR/ROXICODONE Take 1 tablet (5 mg total) by mouth every 4 (four) hours as needed for severe pain.   polyethylene glycol 17 g packet Commonly known as: MIRALAX / GLYCOLAX Take 17 g by mouth 2 (two) times daily.   progesterone 100 MG capsule Commonly known as: PROMETRIUM Take 100 mg by mouth See admin instructions. Take 100 mg by mouth on days 1-10 of every month   spironolactone 25 MG tablet Commonly known as: ALDACTONE TAKE ONE-HALF TABLET DAILY   Systane Complete PF 0.6 % Soln Generic drug: Propylene Glycol (PF) Place 1 drop into both eyes 3 (three) times daily as needed (for dryness).   tiZANidine 4 MG tablet Commonly known as: ZANAFLEX Take 4 mg by mouth at bedtime.   TURMERIC CURCUMIN PO Take 500 mg by mouth daily.   TYLENOL 500 MG tablet Generic drug: acetaminophen Take 1,000 mg by mouth See admin instructions. Take 1,000 mg by mouth in the morning and an additional 1,000 mg up to three times a day as needed for pain or headaches   vitamin C 1000 MG tablet Take 1,000 mg by mouth daily.   Vitamin D (Ergocalciferol) 1.25 MG (50000 UNIT) Caps capsule Commonly known as: DRISDOL Take 50,000 Units by mouth every 30 (thirty) days.   Vitamin D3 50 MCG (2000 UT) Tabs Take 2,000 Units by mouth  daily.   zinc gluconate 50 MG tablet Take 50 mg by mouth daily.   ZzzQuil 50 MG/30ML Liqd Generic drug: diphenhydrAMINE HCl (Sleep) Take 50 mg by mouth at bedtime.       ASK your doctor about these medications    cefadroxil 500 MG capsule Commonly known as: DURICEF Take 1 capsule (500 mg total) by mouth 2 (two) times daily for 14 days. Ask about: Should I take this medication?               Discharge Care Instructions  (From admission, onward)           Start     Ordered  03/10/22 0000  Change dressing       Comments: Maintain surgical dressing until follow up in the clinic. If the edges start to pull up, may reinforce with tape. If the dressing is no longer working, may remove and cover with gauze and tape, but must keep the area dry and clean.  Call with any questions or concerns.   03/10/22 0823            Diagnostic Studies: MM 3D SCREEN BREAST BILATERAL  Result Date: 03/18/2022 CLINICAL DATA:  Screening. EXAM: DIGITAL SCREENING BILATERAL MAMMOGRAM WITH TOMOSYNTHESIS AND CAD TECHNIQUE: Bilateral screening digital craniocaudal and mediolateral oblique mammograms were obtained. Bilateral screening digital breast tomosynthesis was performed. The images were evaluated with computer-aided detection. COMPARISON:  Previous exam(s). ACR Breast Density Category b: There are scattered areas of fibroglandular density. FINDINGS: In the right breast, calcifications warrant further evaluation with magnified views. In the left breast, no findings suspicious for malignancy. IMPRESSION: Further evaluation is suggested for calcifications in the right breast. RECOMMENDATION: Diagnostic mammogram of the right breast. (Code:[id number]-61M) The patient will be contacted regarding the findings, and additional imaging will be scheduled. BI-RADS CATEGORY  0: Incomplete: Need additional imaging evaluation. Electronically Signed   By: Lajean Manes M.D.   On: 03/18/2022 08:50    Disposition:  Discharge disposition: 01-Home or Self Care       Discharge Instructions     Call MD / Call 911   Complete by: As directed    If you experience chest pain or shortness of breath, CALL 911 and be transported to the hospital emergency room.  If you develope a fever above 101 F, pus (white drainage) or increased drainage or redness at the wound, or calf pain, call your surgeon's office.   Change dressing   Complete by: As directed    Maintain surgical dressing until follow up in the clinic. If the edges start to pull up, may reinforce with tape. If the dressing is no longer working, may remove and cover with gauze and tape, but must keep the area dry and clean.  Call with any questions or concerns.   Constipation Prevention   Complete by: As directed    Drink plenty of fluids.  Prune juice may be helpful.  You may use a stool softener, such as Colace (over the counter) 100 mg twice a day.  Use MiraLax (over the counter) for constipation as needed.   Diet - low sodium heart healthy   Complete by: As directed    Increase activity slowly as tolerated   Complete by: As directed    Weight bearing as tolerated with assist device (walker, cane, etc) as directed, use it as long as suggested by your surgeon or therapist, typically at least 4-6 weeks.   Post-operative opioid taper instructions:   Complete by: As directed    POST-OPERATIVE OPIOID TAPER INSTRUCTIONS: It is important to wean off of your opioid medication as soon as possible. If you do not need pain medication after your surgery it is ok to stop day one. Opioids include: Codeine, Hydrocodone(Norco, Vicodin), Oxycodone(Percocet, oxycontin) and hydromorphone amongst others.  Long term and even short term use of opiods can cause: Increased pain response Dependence Constipation Depression Respiratory depression And more.  Withdrawal symptoms can include Flu like symptoms Nausea, vomiting And more Techniques to manage these  symptoms Hydrate well Eat regular healthy meals Stay active Use relaxation techniques(deep breathing, meditating, yoga) Do Not substitute Alcohol to help with tapering If you  have been on opioids for less than two weeks and do not have pain than it is ok to stop all together.  Plan to wean off of opioids This plan should start within one week post op of your joint replacement. Maintain the same interval or time between taking each dose and first decrease the dose.  Cut the total daily intake of opioids by one tablet each day Next start to increase the time between doses. The last dose that should be eliminated is the evening dose.      TED hose   Complete by: As directed    Use stockings (TED hose) for 2 weeks on both leg(s).  You may remove them at night for sleeping.        Follow-up Information     Paralee Cancel, MD. Schedule an appointment as soon as possible for a visit in 2 week(s).   Specialty: Orthopedic Surgery Contact information: [address] [place] [address] [id number]                  Signed: Irving Copas 03/25/2022, 7:59 AM

## 2022-03-28 ENCOUNTER — Ambulatory Visit
Admission: RE | Admit: 2022-03-28 | Discharge: 2022-03-28 | Disposition: A | Discharge status: Home / Self Care | Payer: Medicare Other | Source: Ambulatory Visit | Attending: Obstetrics and Gynecology | Admitting: Obstetrics and Gynecology

## 2022-03-28 ENCOUNTER — Other Ambulatory Visit: Payer: Self-pay | Admitting: Obstetrics and Gynecology

## 2022-03-28 DIAGNOSIS — R928 Other abnormal and inconclusive findings on diagnostic imaging of breast: Secondary | ICD-10-CM

## 2022-03-28 DIAGNOSIS — R921 Mammographic calcification found on diagnostic imaging of breast: Secondary | ICD-10-CM

## 2022-04-07 ENCOUNTER — Ambulatory Visit
Admission: RE | Admit: 2022-04-07 | Discharge: 2022-04-07 | Disposition: A | Discharge status: Home / Self Care | Payer: Medicare Other | Source: Ambulatory Visit | Attending: Obstetrics and Gynecology | Admitting: Obstetrics and Gynecology

## 2022-04-07 DIAGNOSIS — R921 Mammographic calcification found on diagnostic imaging of breast: Secondary | ICD-10-CM

## 2022-04-07 DIAGNOSIS — R928 Other abnormal and inconclusive findings on diagnostic imaging of breast: Secondary | ICD-10-CM

## 2022-04-07 HISTORY — PX: BREAST BIOPSY: SHX20

## 2022-04-16 ENCOUNTER — Encounter: Payer: Self-pay | Admitting: Podiatry

## 2022-04-16 ENCOUNTER — Ambulatory Visit (INDEPENDENT_AMBULATORY_CARE_PROVIDER_SITE_OTHER): Payer: Medicare Other | Admitting: Podiatry

## 2022-04-16 DIAGNOSIS — L600 Ingrowing nail: Secondary | ICD-10-CM

## 2022-04-16 NOTE — Patient Instructions (Addendum)

## 2022-04-16 NOTE — Progress Notes (Signed)
Subjective:   Patient ID: Megan Savage, female   DOB: 67 y.o.   MRN: [id number]   HPI Patient presents stating she is ready to get this big toenail removed left that it has been sore and she is starting to get active after having knee replacement   ROS      Objective:  Physical Exam  Neurovascular status intact with a thickened deformed big toenail left foot painful     Assessment:  Chronic nail disease left big toe nail     Plan:  Recommended removal explained procedure risk patient wants surgery and after extensive review signed consent form.  I reviewed what would be required patient is willing to accept this and I infiltrated 60 mg like Marcaine mixture sterile prep done using sterile instrumentation remove the hallux nail exposed matrix applied phenol for applications 30 seconds followed by alcohol lavage sterile dressing gave instructions on soaks and leave dressings on 24 hours take them off earlier if throbbing were to occur and encouraged her to call with questions concerns

## 2022-04-23 ENCOUNTER — Other Ambulatory Visit: Payer: Self-pay | Admitting: Cardiovascular Disease

## 2022-05-15 ENCOUNTER — Other Ambulatory Visit: Payer: Self-pay | Admitting: Cardiovascular Disease

## 2022-05-20 ENCOUNTER — Encounter: Payer: Self-pay | Admitting: Cardiovascular Disease

## 2022-06-05 ENCOUNTER — Encounter: Payer: Self-pay | Admitting: Pulmonary Disease

## 2022-06-05 ENCOUNTER — Ambulatory Visit (INDEPENDENT_AMBULATORY_CARE_PROVIDER_SITE_OTHER): Payer: Medicare Other | Admitting: Pulmonary Disease

## 2022-06-05 VITALS — BP 120/62 | HR 86 | Ht 64.5 in | Wt 179.4 lb

## 2022-06-05 DIAGNOSIS — J301 Allergic rhinitis due to pollen: Secondary | ICD-10-CM

## 2022-06-05 DIAGNOSIS — J4520 Mild intermittent asthma, uncomplicated: Secondary | ICD-10-CM

## 2022-06-05 MED ORDER — MONTELUKAST SODIUM 10 MG PO TABS: 10.0000 mg | ORAL_TABLET | Freq: Every day | ORAL | 11 refills | Status: DC

## 2022-06-05 NOTE — Patient Instructions (Addendum)
Thank you for visiting Dr. Tonia Brooms at [facility]. Today we recommend the following:  Meds ordered this encounter  Medications   montelukast (SINGULAIR) 10 MG tablet    Sig: Take 1 tablet (10 mg total) by mouth at bedtime.    Dispense:  30 tablet    Refill:  11   Return in about 1 year (around 06/05/2023) for w/ Dr. Tonia Brooms .    Please do your part to reduce the spread of COVID-19.

## 2022-06-05 NOTE — Progress Notes (Signed)
Synopsis: Referred in May 2022 for cough by Creola Corn, MD  Subjective:   PATIENT ID: Megan Savage GENDER: female DOB: [id number], MRN: [id number]  Chief Complaint  Patient presents with   Follow-up    1 year f/up    PMH of arthritis, fatigue, HLD, HTN, insomnia, obesity. She saw Dr. Delford Field several years ago ~8 years. Usually twice per has issues with coughing. Everytime she gets a URI  It goes to her chest. She feels like her symptoms are better now. She saw Dr. Delford Field recently was given steroids and inhaler. Her symptoms are now better. Usually every year Q1 of the year she has bronchitis symptoms. She has allergy testing by Dr. Corinda Gubler ~20 years ago. Takes no antihistamines today. She has had them before with loratidine and flonase. Has a poodle in the house. She is allergic to cats and she just cant be around them.   OV 68/08/2021:Here today for follow-up of mild intermittent asthma symptoms.  Last seen in the office for bronchitis in October 2022.  Saw Buelah Manis, NP was treated with steroids plus Z-Pak.  Continued on Symbicort.  Patient had follow-up video visit at the end of the month.  Felt like she was doing better.  Regional allergy panel positive IgE 44.  RAST panel positive for dust mites, cat dander, dog dander, cockroach.  From respiratory standpoint doing well.  Not using any inhalers at the moment.  She does have Symbicort use at home as needed.  OV 06/05/2022: Here today for follow-up.  She has mild intermittent asthma symptoms.  This year she underwent a hip replacement as well as knee replacement she did have a issue with a fall after her knee replacement that had to have a repeat surgery.  At this point she is doing okay.  Unfortunately she did have recurrent respiratory symptoms.  She has not been using her Symbicort except as a rescue.  We talked about more of a preventative approach.  We talked about her previous RAST panel and IgE panel as documented above.    Past  Medical History:  Diagnosis Date   Anemia    Arthritis    Bronchitis    Depression    denies, borther passed away   Fatigue    resolved    Heart murmur    Hypercholesterolemia    Hyperlipidemia    controlled    Hypertension    controlled   Hypothyroidism    Insomnia    Obesity    Skin disorder    rosacea     Family History  Problem Relation Age of Onset   Arrhythmia Mother    Emphysema Mother    Asthma Mother    Cancer Father    Asthma Paternal Aunt    Cancer Brother        leumkemia   Asthma Brother    Breast cancer Neg Hx      Past Surgical History:  Procedure Laterality Date   APPENDECTOMY  1976   BREAST BIOPSY Right 04/07/2022   MM RT BREAST BX W LOC DEV 1ST LESION IMAGE BX SPEC STEREO GUIDE 04/07/2022 [facility]   BREAST IMPLANT REMOVAL Bilateral    implanted 1996 removed 2002   CARPAL TUNNEL RELEASE     bilateral    ENDOMETRIAL FULGURATION  1992   foot surgery      tumor removed (left)    HYSTEROSCOPY WITH D & C N/A 06/15/2018   Procedure: DILATATION AND CURETTAGE /HYSTEROSCOPY;  Surgeon: Marcelle Overlie, MD;  Location: [facility];  Service: Gynecology;  Laterality: N/A;   hysterscopy      I & D EXTREMITY Left 03/09/2022   Procedure: IRRIGATION AND DEBRIDEMENT LEFT KNEE WITH PRIMARY CLOSURE;  Surgeon: Durene Romans, MD;  Location: [facility];  Service: Orthopedics;  Laterality: Left;   OOPHORECTOMY  1976   left ovary removed    TOTAL HIP ARTHROPLASTY Right 08/27/2021   Procedure: TOTAL HIP ARTHROPLASTY ANTERIOR APPROACH;  Surgeon: Durene Romans, MD;  Location: [facility];  Service: Orthopedics;  Laterality: Right;   TOTAL KNEE ARTHROPLASTY Left 02/18/2022   Procedure: TOTAL KNEE ARTHROPLASTY;  Surgeon: Durene Romans, MD;  Location: [facility];  Service: Orthopedics;  Laterality: Left;   TOTAL SHOULDER ARTHROPLASTY Right 09/17/2018   Procedure: TOTAL SHOULDER ARTHROPLASTY anatomical;  Surgeon: Beverely Low, MD;  Location: [facility];  Service:  Orthopedics;  Laterality: Right;    Social History   Socioeconomic History   Marital status: Divorced    Spouse name: Not on file   Number of children: Not on file   Years of education: Not on file   Highest education level: Not on file  Occupational History   Occupation: TEFL teacher: SELF-EMPLOYED  Tobacco Use   Smoking status: Never   Smokeless tobacco: Never  Vaping Use   Vaping Use: Never used  Substance and Sexual Activity   Alcohol use: Yes    Comment: 2-3 glasses wine daily , 1 drink each weekend    Drug use: Never   Sexual activity: Not on file  Other Topics Concern   Not on file  Social History Narrative   Not on file   Social Determinants of Health   Financial Resource Strain: Not on file  Food Insecurity: No Food Insecurity (03/09/2022)   Hunger Vital Sign    Worried About Running Out of Food in the Last Year: Never true    Ran Out of Food in the Last Year: Never true  Transportation Needs: No Transportation Needs (03/09/2022)   PRAPARE - Administrator, Civil Service (Medical): No    Lack of Transportation (Non-Medical): No  Physical Activity: Not on file  Stress: Not on file  Social Connections: Not on file  Intimate Partner Violence: Not At Risk (03/09/2022)   Humiliation, Afraid, Rape, and Kick questionnaire    Fear of Current or Ex-Partner: No    Emotionally Abused: No    Physically Abused: No    Sexually Abused: No     Allergies  Allergen Reactions   Cat Hair Extract Other (See Comments)    Watery eyes   Other Other (See Comments)    Patient was INTUBATED and received GAS for a procedure- left her feeling WOOZY afterwards     Outpatient Medications Prior to Visit  Medication Sig Dispense Refill   Ascorbic Acid (VITAMIN C) 1000 MG tablet Take 1,000 mg by mouth daily.     CALCIUM CITRATE-VITAMIN D PO Take 2 tablets by mouth daily with breakfast.  500 mg / 400 Units     Cholecalciferol (VITAMIN D3) 50 MCG (2000  UT) TABS Take 2,000 Units by mouth daily.     Choline Fenofibrate (FENOFIBRIC ACID) 135 MG CPDR TAKE ONE CAPSULE EACH DAY 90 capsule 2   diphenhydrAMINE HCl, Sleep, (ZZZQUIL) 50 MG/30ML LIQD Take 50 mg by mouth at bedtime.     estradiol (VIVELLE-DOT) 0.025 MG/24HR Place 1 patch onto the skin See admin instructions.  Place  1 patch onto the skin on Tuesdays and Fridays- after removing the old one     ezetimibe (ZETIA) 10 MG tablet TAKE ONE TABLET DAILY (Patient taking differently: Take 10 mg by mouth daily.) 90 tablet 3   levothyroxine (SYNTHROID) 88 MCG tablet TAKE ONE TABLET DAILY BEFORE BREAKFAST 90 tablet 2   multivitamin (THERAGRAN) per tablet Take 1 tablet by mouth daily with breakfast.     olmesartan (BENICAR) 20 MG tablet TAKE ONE TABLET BY MOUTH ONCE DAILY (Patient taking differently: Take 20 mg by mouth daily.) 90 tablet 3   Omega-3 Fatty Acids (FISH OIL) 1000 MG CAPS Take 2,000 mg by mouth 2 (two) times daily.     oxybutynin (DITROPAN-XL) 10 MG 24 hr tablet Take 10 mg by mouth at bedtime.     progesterone (PROMETRIUM) 100 MG capsule Take 100 mg by mouth See admin instructions. Take 100 mg by mouth on days 1-10 of every month     Propylene Glycol, PF, (SYSTANE COMPLETE PF) 0.6 % SOLN Place 1 drop into both eyes 3 (three) times daily as needed (for dryness).     spironolactone (ALDACTONE) 25 MG tablet Take 0.5 tablets (12.5 mg total) by mouth daily. 45 tablet 3   TURMERIC CURCUMIN PO Take 500 mg by mouth daily.     Vitamin D, Ergocalciferol, (DRISDOL) 50000 units CAPS capsule Take 50,000 Units by mouth every 30 (thirty) days.      zinc gluconate 50 MG tablet Take 50 mg by mouth daily.     celecoxib (CELEBREX) 200 MG capsule Take 1 capsule (200 mg total) by mouth 2 (two) times daily. 60 capsule 0   loratadine (CLARITIN) 10 MG tablet Take 1 tablet (10 mg total) by mouth daily. (Patient taking differently: Take 10 mg by mouth daily as needed for allergies.) 90 tablet 3   methocarbamol (ROBAXIN)  500 MG tablet Take 1 tablet (500 mg total) by mouth every 6 (six) hours as needed for muscle spasms. 40 tablet 1   oxyCODONE (OXY IR/ROXICODONE) 5 MG immediate release tablet Take 1 tablet (5 mg total) by mouth every 4 (four) hours as needed for severe pain. 30 tablet 0   polyethylene glycol (MIRALAX / GLYCOLAX) 17 g packet Take 17 g by mouth 2 (two) times daily. 14 each 0   tiZANidine (ZANAFLEX) 4 MG tablet Take 4 mg by mouth at bedtime.     TYLENOL 500 MG tablet Take 1,000 mg by mouth See admin instructions. Take 1,000 mg by mouth in the morning and an additional 1,000 mg up to three times a day as needed for pain or headaches     No facility-administered medications prior to visit.    Review of Systems  Constitutional:  Negative for chills, fever, malaise/fatigue and weight loss.  HENT:  Negative for hearing loss, sore throat and tinnitus.   Eyes:  Negative for blurred vision and double vision.  Respiratory:  Positive for sputum production and shortness of breath. Negative for cough, hemoptysis, wheezing and stridor.   Cardiovascular:  Negative for chest pain, palpitations, orthopnea, leg swelling and PND.  Gastrointestinal:  Negative for abdominal pain, constipation, diarrhea, heartburn, nausea and vomiting.  Genitourinary:  Negative for dysuria, hematuria and urgency.  Musculoskeletal:  Negative for joint pain and myalgias.  Skin:  Negative for itching and rash.  Neurological:  Negative for dizziness, tingling, weakness and headaches.  Endo/Heme/Allergies:  Negative for environmental allergies. Does not bruise/bleed easily.  Psychiatric/Behavioral:  Negative for depression. The patient is not  nervous/anxious and does not have insomnia.   All other systems reviewed and are negative.    Objective:  Physical Exam Vitals reviewed.  Constitutional:      General: She is not in acute distress.    Appearance: She is well-developed.  HENT:     Head: Normocephalic and atraumatic.  Eyes:      General: No scleral icterus.    Conjunctiva/sclera: Conjunctivae normal.     Pupils: Pupils are equal, round, and reactive to light.  Neck:     Vascular: No JVD.     Trachea: No tracheal deviation.  Cardiovascular:     Rate and Rhythm: Normal rate and regular rhythm.     Heart sounds: Normal heart sounds. No murmur heard. Pulmonary:     Effort: Pulmonary effort is normal. No tachypnea, accessory muscle usage or respiratory distress.     Breath sounds: No stridor. No wheezing, rhonchi or rales.  Abdominal:     General: There is no distension.     Palpations: Abdomen is soft.     Tenderness: There is no abdominal tenderness.  Musculoskeletal:        General: No tenderness.     Cervical back: Neck supple.  Lymphadenopathy:     Cervical: No cervical adenopathy.  Skin:    General: Skin is warm and dry.     Capillary Refill: Capillary refill takes less than 2 seconds.     Findings: No rash.  Neurological:     Mental Status: She is alert and oriented to person, place, and time.  Psychiatric:        Behavior: Behavior normal.      Vitals:   06/05/22 0944  BP: 120/62  Pulse: 86  SpO2: 99%  Weight: 179 lb 6.4 oz (81.4 kg)  Height: 5' 4.5" (1.638 m)   99% on RA BMI Readings from Last 3 Encounters:  06/05/22 30.32 kg/m  03/09/22 29.57 kg/m  02/18/22 29.57 kg/m   Wt Readings from Last 3 Encounters:  06/05/22 179 lb 6.4 oz (81.4 kg)  03/09/22 175 lb (79.4 kg)  02/18/22 175 lb (79.4 kg)     CBC    Component Value Date/Time   WBC 7.8 03/10/2022 0314   RBC 2.93 (L) 03/10/2022 0314   HGB 8.8 (L) 03/10/2022 0314   HGB 13.3 05/16/2020 0722   HCT 27.6 (L) 03/10/2022 0314   HCT 40.0 05/16/2020 0722   PLT 308 03/10/2022 0314   PLT 272 05/16/2020 0722   MCV 94.2 03/10/2022 0314   MCV 89 05/16/2020 0722   MCH 30.0 03/10/2022 0314   MCHC 31.9 03/10/2022 0314   RDW 12.7 03/10/2022 0314   RDW 12.2 05/16/2020 0722   LYMPHSABS 1.5 06/21/2010 0811   MONOABS 0.3  06/21/2010 0811   EOSABS 0.1 06/21/2010 0811   BASOSABS 0.0 06/21/2010 0811    Chest Imaging: 05/04/2020 chest x-ray: No infiltrate, no effusion The patient's images have been independently reviewed by me.    Pulmonary Functions Testing Results:     No data to display          FeNO: none  Pathology: none  Echocardiogram: none  Heart Catheterization: none    Assessment & Plan:     ICD-10-CM   1. Mild intermittent asthma without complication  J45.20     2. Seasonal allergic rhinitis due to pollen  J30.1       Discussion:  This is a 67 year old female with moderate persistent asthma symptoms, intermittent recurrent bronchitis symptoms throughout  the year.  Has been treated with steroids and antibiotics in the past.  She has been using Symbicort as needed for mild intermittent asthma symptoms.  Plan: Our recommendation is for her to start using her inhalers regularly. We again that she starts her Symbicort to use it in the morning and at night as preventative methods that we can try to prevent her from having exacerbations. Start Singulair Return to see me in 1 year or as needed.   Current Outpatient Medications:    Ascorbic Acid (VITAMIN C) 1000 MG tablet, Take 1,000 mg by mouth daily., Disp: , Rfl:    CALCIUM CITRATE-VITAMIN D PO, Take 2 tablets by mouth daily with breakfast.  500 mg / 400 Units, Disp: , Rfl:    Cholecalciferol (VITAMIN D3) 50 MCG (2000 UT) TABS, Take 2,000 Units by mouth daily., Disp: , Rfl:    Choline Fenofibrate (FENOFIBRIC ACID) 135 MG CPDR, TAKE ONE CAPSULE EACH DAY, Disp: 90 capsule, Rfl: 2   diphenhydrAMINE HCl, Sleep, (ZZZQUIL) 50 MG/30ML LIQD, Take 50 mg by mouth at bedtime., Disp: , Rfl:    estradiol (VIVELLE-DOT) 0.025 MG/24HR, Place 1 patch onto the skin See admin instructions.  Place 1 patch onto the skin on Tuesdays and Fridays- after removing the old one, Disp: , Rfl:    ezetimibe (ZETIA) 10 MG tablet, TAKE ONE TABLET DAILY (Patient  taking differently: Take 10 mg by mouth daily.), Disp: 90 tablet, Rfl: 3   levothyroxine (SYNTHROID) 88 MCG tablet, TAKE ONE TABLET DAILY BEFORE BREAKFAST, Disp: 90 tablet, Rfl: 2   multivitamin (THERAGRAN) per tablet, Take 1 tablet by mouth daily with breakfast., Disp: , Rfl:    olmesartan (BENICAR) 20 MG tablet, TAKE ONE TABLET BY MOUTH ONCE DAILY (Patient taking differently: Take 20 mg by mouth daily.), Disp: 90 tablet, Rfl: 3   Omega-3 Fatty Acids (FISH OIL) 1000 MG CAPS, Take 2,000 mg by mouth 2 (two) times daily., Disp: , Rfl:    oxybutynin (DITROPAN-XL) 10 MG 24 hr tablet, Take 10 mg by mouth at bedtime., Disp: , Rfl:    progesterone (PROMETRIUM) 100 MG capsule, Take 100 mg by mouth See admin instructions. Take 100 mg by mouth on days 1-10 of every month, Disp: , Rfl:    Propylene Glycol, PF, (SYSTANE COMPLETE PF) 0.6 % SOLN, Place 1 drop into both eyes 3 (three) times daily as needed (for dryness)., Disp: , Rfl:    spironolactone (ALDACTONE) 25 MG tablet, Take 0.5 tablets (12.5 mg total) by mouth daily., Disp: 45 tablet, Rfl: 3   TURMERIC CURCUMIN PO, Take 500 mg by mouth daily., Disp: , Rfl:    Vitamin D, Ergocalciferol, (DRISDOL) 50000 units CAPS capsule, Take 50,000 Units by mouth every 30 (thirty) days. , Disp: , Rfl:    zinc gluconate 50 MG tablet, Take 50 mg by mouth daily., Disp: , Rfl:    Josephine Igo, DO [facility] 06/05/2022 9:59 AM

## 2022-10-28 ENCOUNTER — Other Ambulatory Visit: Payer: Self-pay | Admitting: Orthopedic Surgery

## 2022-10-28 DIAGNOSIS — M5459 Other low back pain: Secondary | ICD-10-CM

## 2022-10-31 ENCOUNTER — Ambulatory Visit
Admission: RE | Admit: 2022-10-31 | Discharge: 2022-10-31 | Disposition: A | Discharge status: Home / Self Care | Payer: Medicare Other | Source: Ambulatory Visit | Attending: Orthopedic Surgery | Admitting: Orthopedic Surgery

## 2022-10-31 DIAGNOSIS — M5459 Other low back pain: Secondary | ICD-10-CM

## 2022-11-03 ENCOUNTER — Other Ambulatory Visit: Payer: Medicare Other

## 2022-11-13 ENCOUNTER — Other Ambulatory Visit: Payer: Self-pay | Admitting: Neurosurgery

## 2022-11-14 ENCOUNTER — Other Ambulatory Visit: Payer: Self-pay

## 2022-11-14 ENCOUNTER — Encounter (HOSPITAL_COMMUNITY): Payer: Self-pay

## 2022-11-14 ENCOUNTER — Encounter (HOSPITAL_COMMUNITY)
Admission: RE | Admit: 2022-11-14 | Discharge: 2022-11-14 | Disposition: A | Discharge status: Home / Self Care | Payer: Medicare Other | Source: Ambulatory Visit | Attending: Neurosurgery | Admitting: Neurosurgery

## 2022-11-14 ENCOUNTER — Encounter: Payer: Self-pay | Admitting: Cardiovascular Disease

## 2022-11-14 VITALS — BP 137/63 | HR 91 | Temp 99.0°F | Resp 18 | Ht 64.0 in | Wt 165.1 lb

## 2022-11-14 DIAGNOSIS — I2510 Atherosclerotic heart disease of native coronary artery without angina pectoris: Secondary | ICD-10-CM | POA: Diagnosis not present

## 2022-11-14 DIAGNOSIS — R9431 Abnormal electrocardiogram [ECG] [EKG]: Secondary | ICD-10-CM | POA: Insufficient documentation

## 2022-11-14 DIAGNOSIS — Z789 Other specified health status: Secondary | ICD-10-CM | POA: Insufficient documentation

## 2022-11-14 DIAGNOSIS — R7309 Other abnormal glucose: Secondary | ICD-10-CM

## 2022-11-14 DIAGNOSIS — E785 Hyperlipidemia, unspecified: Secondary | ICD-10-CM | POA: Diagnosis not present

## 2022-11-14 DIAGNOSIS — Z01812 Encounter for preprocedural laboratory examination: Secondary | ICD-10-CM | POA: Diagnosis present

## 2022-11-14 DIAGNOSIS — Z01818 Encounter for other preprocedural examination: Secondary | ICD-10-CM | POA: Insufficient documentation

## 2022-11-14 DIAGNOSIS — R7303 Prediabetes: Secondary | ICD-10-CM | POA: Diagnosis not present

## 2022-11-14 DIAGNOSIS — I10 Essential (primary) hypertension: Secondary | ICD-10-CM | POA: Diagnosis not present

## 2022-11-14 DIAGNOSIS — Z01810 Encounter for preprocedural cardiovascular examination: Secondary | ICD-10-CM | POA: Diagnosis present

## 2022-11-14 DIAGNOSIS — J45909 Unspecified asthma, uncomplicated: Secondary | ICD-10-CM | POA: Diagnosis not present

## 2022-11-14 DIAGNOSIS — R799 Abnormal finding of blood chemistry, unspecified: Secondary | ICD-10-CM

## 2022-11-14 DIAGNOSIS — E039 Hypothyroidism, unspecified: Secondary | ICD-10-CM | POA: Diagnosis not present

## 2022-11-14 HISTORY — DX: Unspecified asthma, uncomplicated: J45.909

## 2022-11-14 LAB — COMPREHENSIVE METABOLIC PANEL
ALT: 29 U/L (ref 0–44)
AST: 19 U/L (ref 15–41)
Albumin: 4 g/dL (ref 3.5–5.0)
Alkaline Phosphatase: 48 U/L (ref 38–126)
Anion gap: 10 (ref 5–15)
BUN: 17 mg/dL (ref 8–23)
CO2: 23 mmol/L (ref 22–32)
Calcium: 9.7 mg/dL (ref 8.9–10.3)
Chloride: 103 mmol/L (ref 98–111)
Creatinine, Ser: 0.71 mg/dL (ref 0.44–1.00)
GFR, Estimated: >60 mL/min (ref >60)
Glucose, Bld: 256 mg/dL — ABNORMAL HIGH (ref 70–99)
Potassium: 4 mmol/L (ref 3.5–5.1)
Sodium: 136 mmol/L (ref 135–145)
Total Bilirubin: 1 mg/dL (ref 0.3–1.2)
Total Protein: 6.7 g/dL (ref 6.5–8.1)

## 2022-11-14 LAB — TYPE AND SCREEN
ABO/RH(D): O POS
Antibody Screen: NEGATIVE

## 2022-11-14 LAB — CBC
HCT: 39.6 % (ref 36.0–46.0)
Hemoglobin: 13.4 g/dL (ref 12.0–15.0)
MCH: 30 pg (ref 26.0–34.0)
MCHC: 33.8 g/dL (ref 30.0–36.0)
MCV: 88.6 fL (ref 80.0–100.0)
Platelets: 279 10*3/uL (ref 150–400)
RBC: 4.47 MIL/uL (ref 3.87–5.11)
RDW: 12.5 % (ref 11.5–15.5)
WBC: 6.1 10*3/uL (ref 4.0–10.5)
nRBC: 0 % (ref 0.0–0.2)

## 2022-11-14 LAB — SURGICAL PCR SCREEN
MRSA, PCR: NEGATIVE
Staphylococcus aureus: NEGATIVE

## 2022-11-14 NOTE — Progress Notes (Addendum)
Surgical Instructions   Your procedure is scheduled on November 18, 2022. Report to [facility] Main Entrance "A" at 5:30 A.M., then check in with the Admitting office. Any questions or running late day of surgery: call [phone]  Questions prior to your surgery date: call [phone], Monday-Friday, 8am-4pm. If you experience any cold or flu symptoms such as cough, fever, chills, shortness of breath, etc. between now and your scheduled surgery, please notify us at the above number.     Remember:  Do not eat or drink after midnight the night before your surgery    Take these medicines the morning of surgery with A SIP OF WATER: ezetimibe (ZETIA)  fexofenadine (ALLEGRA)  levothyroxine (SYNTHROID)  Choline Fenofibrate (FENOFIBRIC ACID)    May take these medicines IF NEEDED: acetaminophen (TYLENOL)  methocarbamol (ROBAXIN)  Propylene Glycol, PF, (SYSTANE COMPLETE PF) eye drops   One week prior to surgery, STOP taking any Aspirin (unless otherwise instructed by your surgeon) Aleve, Naproxen, Ibuprofen, Motrin, Advil, Goody's, BC's, all herbal medications, fish oil, and non-prescription vitamins. This includes your medication: celecoxib (CELEBREX)                      Do NOT Smoke (Tobacco/Vaping) for 24 hours prior to your procedure.  If you use a CPAP at night, you may bring your mask/headgear for your overnight stay.   You will be asked to remove any contacts, glasses, piercing's, hearing aid's, dentures/partials prior to surgery. Please bring cases for these items if needed.    Patients discharged the day of surgery will not be allowed to drive home, and someone needs to stay with them for 24 hours.  SURGICAL WAITING ROOM VISITATION Patients may have no more than 2 support people in the waiting area - these visitors may rotate.   Pre-op nurse will coordinate an appropriate time for 1 ADULT support person, who may not rotate, to accompany patient in pre-op.  Children under  the age of 39 must have an adult with them who is not the patient and must remain in the main waiting area with an adult.  If the patient needs to stay at the hospital during part of their recovery, the visitor guidelines for inpatient rooms apply.  Please refer to the [facility] website for the visitor guidelines for any additional information.   If you received a COVID test during your pre-op visit  it is requested that you wear a mask when out in public, stay away from anyone that may not be feeling well and notify your surgeon if you develop symptoms. If you have been in contact with anyone that has tested positive in the last 10 days please notify you surgeon.      Pre-operative 5 CHG Bathing Instructions   You can play a key role in reducing the risk of infection after surgery. Your skin needs to be as free of germs as possible. You can reduce the number of germs on your skin by washing with CHG (chlorhexidine gluconate) soap before surgery. CHG is an antiseptic soap that kills germs and continues to kill germs even after washing.   DO NOT use if you have an allergy to chlorhexidine/CHG or antibacterial soaps. If your skin becomes reddened or irritated, stop using the CHG and notify one of our RNs at [phone].   Please shower with the CHG soap starting 4 days before surgery using the following schedule:     Please keep in mind the following:  DO NOT shave, including legs and underarms, starting the day of your first shower.   You may shave your face at any point before/day of surgery.  Place clean sheets on your bed the day you start using CHG soap. Use a clean washcloth (not used since being washed) for each shower. DO NOT sleep with pets once you start using the CHG.   CHG Shower Instructions:  Wash your face and private area with normal soap. If you choose to wash your hair, wash first with your normal shampoo.  After you use shampoo/soap, rinse your hair and body  thoroughly to remove shampoo/soap residue.  Turn the water OFF and apply about 3 tablespoons (45 ml) of CHG soap to a CLEAN washcloth.  Apply CHG soap ONLY FROM YOUR NECK DOWN TO YOUR TOES (washing for 3-5 minutes)  DO NOT use CHG soap on face, private areas, open wounds, or sores.  Pay special attention to the area where your surgery is being performed.  If you are having back surgery, having someone wash your back for you may be helpful. Wait 2 minutes after CHG soap is applied, then you may rinse off the CHG soap.  Pat dry with a clean towel  Put on clean clothes/pajamas   If you choose to wear lotion, please use ONLY the CHG-compatible lotions on the back of this paper.   Additional instructions for the day of surgery: DO NOT APPLY any lotions, deodorants, cologne, or perfumes.   Do not bring valuables to the hospital. [facility] is not responsible for any belongings/valuables. Do not wear nail polish, gel polish, artificial nails, or any other type of covering on natural nails (fingers and toes) Do not wear jewelry or makeup Put on clean/comfortable clothes.  Please brush your teeth.  Ask your nurse before applying any prescription medications to the skin.     CHG Compatible Lotions   Aveeno Moisturizing lotion  Cetaphil Moisturizing Cream  Cetaphil Moisturizing Lotion  Clairol Herbal Essence Moisturizing Lotion, Dry Skin  Clairol Herbal Essence Moisturizing Lotion, Extra Dry Skin  Clairol Herbal Essence Moisturizing Lotion, Normal Skin  Curel Age Defying Therapeutic Moisturizing Lotion with Alpha Hydroxy  Curel Extreme Care Body Lotion  Curel Soothing Hands Moisturizing Hand Lotion  Curel Therapeutic Moisturizing Cream, Fragrance-Free  Curel Therapeutic Moisturizing Lotion, Fragrance-Free  Curel Therapeutic Moisturizing Lotion, Original Formula  Eucerin Daily Replenishing Lotion  Eucerin Dry Skin Therapy Plus Alpha Hydroxy Crme  Eucerin Dry Skin Therapy Plus Alpha  Hydroxy Lotion  Eucerin Original Crme  Eucerin Original Lotion  Eucerin Plus Crme Eucerin Plus Lotion  Eucerin TriLipid Replenishing Lotion  Keri Anti-Bacterial Hand Lotion  Keri Deep Conditioning Original Lotion Dry Skin Formula Softly Scented  Keri Deep Conditioning Original Lotion, Fragrance Free Sensitive Skin Formula  Keri Lotion Fast Absorbing Fragrance Free Sensitive Skin Formula  Keri Lotion Fast Absorbing Softly Scented Dry Skin Formula  Keri Original Lotion  Keri Skin Renewal Lotion Keri Silky Smooth Lotion  Keri Silky Smooth Sensitive Skin Lotion  Nivea Body Creamy Conditioning Oil  Nivea Body Extra Enriched Lotion  Nivea Body Original Lotion  Nivea Body Sheer Moisturizing Lotion Nivea Crme  Nivea Skin Firming Lotion  NutraDerm 30 Skin Lotion  NutraDerm Skin Lotion  NutraDerm Therapeutic Skin Cream  NutraDerm Therapeutic Skin Lotion  ProShield Protective Hand Cream  Provon moisturizing lotion  Please read over the following fact sheets that you were given.

## 2022-11-14 NOTE — Progress Notes (Addendum)
PCP - Dr. Creola Corn Cardiologist - Dr. Nicki Guadalajara - Last office visit 11/04/2021 Pulmonologist - Dr. Audie Box  PPM/ICD - Denies Device Orders - n/a Rep Notified - n/a  Chest x-ray - n/a EKG - 11/14/2022 Stress Test - Per pt, many years ago. Result normal ECHO - 10/14/2021 Cardiac Cath - Denies  Sleep Study - Denies CPAP - n/a  No DM  Last dose of GLP1 agonist- n/a GLP1 instructions: n/a  Blood Thinner Instructions: n/a Aspirin Instructions: n/a  NPO after midnight   COVID TEST- n/a   Anesthesia review: Yes. EKG review. Pt received telephonic cardiac clearance for joint replacement in January 2024. She had to reschedule yearly cardiology appointment due to this surgery, but denies any new cardiac issues/symptoms. RN recommended pt notify Dr. Tresa Endo that she will be undergoing another surgery as well. Glucose on CMP 256. A1c ordered off previous collection per Antionette Poles, PA-C. Result pending.  Patient denies shortness of breath, fever, cough and chest pain at PAT appointment. Pt denies any respiratory illness/infection in the last two months.    All instructions explained to the patient, with a verbal understanding of the material. Patient agrees to go over the instructions while at home for a better understanding. Patient also instructed to self quarantine after being tested for COVID-19. The opportunity to ask questions was provided.

## 2022-11-14 NOTE — Plan of Care (Signed)
CHL Tonsillectomy/Adenoidectomy, Postoperative PEDS care plan entered in error.

## 2022-11-17 LAB — HEMOGLOBIN A1C
Hgb A1c MFr Bld: 7.2 % — ABNORMAL HIGH (ref 4.8–5.6)
Mean Plasma Glucose: 159.94 mg/dL

## 2022-11-17 NOTE — Anesthesia Preprocedure Evaluation (Signed)
Anesthesia Evaluation  Patient identified by MRN, date of birth, ID band Patient awake    Reviewed: Allergy & Precautions, NPO status , Patient's Chart, lab work & pertinent test results  History of Anesthesia Complications Negative for: history of anesthetic complications  Airway Mallampati: IV  TM Distance: >3 FB Neck ROM: Limited    Dental  (+) Dental Advisory Given, Teeth Intact   Pulmonary neg shortness of breath, asthma , neg sleep apnea, neg COPD, neg recent URI   breath sounds clear to auscultation       Cardiovascular hypertension, Pt. on medications (-) angina (-) Past MI, (-) Cardiac Stents and (-) CHF  Rhythm:Regular     Neuro/Psych neg Seizures  Neuromuscular disease    GI/Hepatic negative GI ROS, Neg liver ROS,,,  Endo/Other  neg diabetesHypothyroidism  Lab Results      Component                Value               Date                      HGBA1C                   7.2 (H)             11/14/2022             Renal/GU Lab Results      Component                Value               Date                      NA                       136                 11/14/2022                K                        4.0                 11/14/2022                CO2                      23                  11/14/2022                GLUCOSE                  256 (H)             11/14/2022                BUN                      17                  11/14/2022                CREATININE               0.71  11/14/2022                CALCIUM                  9.7                 11/14/2022                GFR                      142.10              06/21/2010                EGFR                     99                  05/16/2020                GFRNONAA                 >60                 11/14/2022                Musculoskeletal  (+) Arthritis ,    Abdominal   Peds  Hematology negative hematology ROS (+) Lab  Results      Component                Value               Date                      WBC                      6.1                 11/14/2022                HGB                      13.4                11/14/2022                HCT                      39.6                11/14/2022                MCV                      88.6                11/14/2022                PLT                      279                 11/14/2022              Anesthesia Other Findings   Reproductive/Obstetrics  Anesthesia Physical Anesthesia Plan  ASA: 2  Anesthesia Plan: General   Post-op Pain Management: Ofirmev IV (intra-op)* and Toradol IV (intra-op)*   Induction: Intravenous  PONV Risk Score and Plan: 4 or greater and Ondansetron and Dexamethasone  Airway Management Planned: Oral ETT  Additional Equipment: None  Intra-op Plan:   Post-operative Plan: Extubation in OR  Informed Consent: I have reviewed the patients History and Physical, chart, labs and discussed the procedure including the risks, benefits and alternatives for the proposed anesthesia with the patient or authorized representative who has indicated his/her understanding and acceptance.     Dental advisory given  Plan Discussed with: CRNA  Anesthesia Plan Comments: (PAT note by Antionette Poles, PA-C: 67 year old female with pertinent history including HTN, hypothyroid, HLD, mild asthma, prediabetes.  Cardiac additions followed by PCP Dr. Timothy Lasso.  He has recently been closely following elevated A1c which is up to this point been in the prediabetic range.  Preop labs notable for glucose 256 and A1c 7.2 now indicating frank diabetes.  These results were routed to Dr. Timothy Lasso.  Remainder of preop labs reviewed, WNL.  EKG 11/14/2022: Normal sinus rhythm.  Rate 91.  Left axis deviation  TTE 10/14/2021: 1. Left ventricular ejection fraction, by estimation, is 60 to 65%. The  left ventricle  has normal function. The left ventricle has no regional  wall motion abnormalities. Left ventricular diastolic parameters were  normal.  2. Right ventricular systolic function is normal. The right ventricular  size is normal.  3. The mitral valve is abnormal. Trivial mitral valve regurgitation. No  evidence of mitral stenosis. Moderate mitral annular calcification.  4. The aortic valve is tricuspid. Aortic valve regurgitation is not  visualized. No aortic stenosis is present.  5. The inferior vena cava is normal in size with greater than 50%  respiratory variability, suggesting right atrial pressure of 3 mmHg.   CT cardiac scoring 07/17/2017: IMPRESSION: 1. No coronary artery calcification.  2. Total Agatston Score: 0  3. MESA age and sex matched database percentile: 0th  4. RIGHT lower lobe noncalcified pulmonary nodule. Non-contrast chest CT at 6-12 months is recommended. If the nodule is stable at time of repeat CT, then future CT at 18-24 months (from today's scan) is considered optional for low-risk patients, but is recommended for high-risk patients. This recommendation follows the consensus statement: Guidelines for Management of Incidental Pulmonary Nodules Detected on CT Images: From the Fleischner Society 2017; Radiology 2017; 284:228-243.   )        Anesthesia Quick Evaluation

## 2022-11-17 NOTE — Progress Notes (Signed)
Anesthesia Chart Review:  67 year old female with pertinent history including HTN, hypothyroid, HLD, mild asthma, prediabetes.  Cardiac additions followed by PCP Dr. Timothy Lasso.  He has recently been closely following elevated A1c which is up to this point been in the prediabetic range.  Preop labs notable for glucose 256 and A1c 7.2 now indicating frank diabetes.  These results were routed to Dr. Timothy Lasso.  Remainder of preop labs reviewed, WNL.  EKG 11/14/2022: Normal sinus rhythm.  Rate 91.  Left axis deviation  TTE 10/14/2021:  1. Left ventricular ejection fraction, by estimation, is 60 to 65%. The  left ventricle has normal function. The left ventricle has no regional  wall motion abnormalities. Left ventricular diastolic parameters were  normal.   2. Right ventricular systolic function is normal. The right ventricular  size is normal.   3. The mitral valve is abnormal. Trivial mitral valve regurgitation. No  evidence of mitral stenosis. Moderate mitral annular calcification.   4. The aortic valve is tricuspid. Aortic valve regurgitation is not  visualized. No aortic stenosis is present.   5. The inferior vena cava is normal in size with greater than 50%  respiratory variability, suggesting right atrial pressure of 3 mmHg.   CT cardiac scoring 07/17/2017: IMPRESSION: 1. No coronary artery calcification.   2. Total Agatston Score: 0   3. MESA age and sex matched database percentile: 0th   4. RIGHT lower lobe noncalcified pulmonary nodule. Non-contrast chest CT at 6-12 months is recommended. If the nodule is stable at time of repeat CT, then future CT at 18-24 months (from today's scan) is considered optional for low-risk patients, but is recommended for high-risk patients. This recommendation follows the consensus statement: Guidelines for Management of Incidental Pulmonary Nodules Detected on CT Images: From the Fleischner Society 2017; Radiology 2017; 284:228-243.    Megan Savage [facility]/Anesthesiology Phone [phone] 11/17/2022 11:11 AM

## 2022-11-17 NOTE — Plan of Care (Signed)
CHL Tonsillectomy/Adenoidectomy, Postoperative PEDS care plan entered in error.

## 2022-11-18 ENCOUNTER — Ambulatory Visit (HOSPITAL_COMMUNITY): Payer: Self-pay | Admitting: Physician Assistant

## 2022-11-18 ENCOUNTER — Ambulatory Visit (HOSPITAL_COMMUNITY): Payer: Medicare Other

## 2022-11-18 ENCOUNTER — Other Ambulatory Visit: Payer: Self-pay

## 2022-11-18 ENCOUNTER — Encounter (HOSPITAL_COMMUNITY): Payer: Self-pay | Admitting: Neurosurgery

## 2022-11-18 ENCOUNTER — Ambulatory Visit (HOSPITAL_COMMUNITY)
Admission: RE | Admit: 2022-11-18 | Discharge: 2022-11-19 | Disposition: A | Discharge status: Home / Self Care | Payer: Medicare Other | Attending: Neurosurgery | Admitting: Neurosurgery

## 2022-11-18 ENCOUNTER — Ambulatory Visit (HOSPITAL_BASED_OUTPATIENT_CLINIC_OR_DEPARTMENT_OTHER): Payer: Self-pay

## 2022-11-18 ENCOUNTER — Encounter (HOSPITAL_COMMUNITY)
Admission: RE | Disposition: A | Discharge status: Home / Self Care | Payer: Self-pay | Source: Home / Self Care | Attending: Neurosurgery

## 2022-11-18 DIAGNOSIS — R799 Abnormal finding of blood chemistry, unspecified: Secondary | ICD-10-CM

## 2022-11-18 DIAGNOSIS — J45909 Unspecified asthma, uncomplicated: Secondary | ICD-10-CM | POA: Diagnosis not present

## 2022-11-18 DIAGNOSIS — M48061 Spinal stenosis, lumbar region without neurogenic claudication: Secondary | ICD-10-CM | POA: Insufficient documentation

## 2022-11-18 DIAGNOSIS — I10 Essential (primary) hypertension: Secondary | ICD-10-CM | POA: Insufficient documentation

## 2022-11-18 DIAGNOSIS — M5416 Radiculopathy, lumbar region: Secondary | ICD-10-CM | POA: Diagnosis not present

## 2022-11-18 DIAGNOSIS — Z79890 Hormone replacement therapy (postmenopausal): Secondary | ICD-10-CM | POA: Insufficient documentation

## 2022-11-18 DIAGNOSIS — Z01818 Encounter for other preprocedural examination: Secondary | ICD-10-CM

## 2022-11-18 DIAGNOSIS — M4316 Spondylolisthesis, lumbar region: Secondary | ICD-10-CM | POA: Insufficient documentation

## 2022-11-18 DIAGNOSIS — Z96652 Presence of left artificial knee joint: Secondary | ICD-10-CM | POA: Diagnosis not present

## 2022-11-18 DIAGNOSIS — M7138 Other bursal cyst, other site: Secondary | ICD-10-CM | POA: Diagnosis not present

## 2022-11-18 DIAGNOSIS — E669 Obesity, unspecified: Secondary | ICD-10-CM | POA: Diagnosis not present

## 2022-11-18 DIAGNOSIS — M21372 Foot drop, left foot: Secondary | ICD-10-CM | POA: Insufficient documentation

## 2022-11-18 DIAGNOSIS — E039 Hypothyroidism, unspecified: Secondary | ICD-10-CM | POA: Insufficient documentation

## 2022-11-18 DIAGNOSIS — Z6827 Body mass index (BMI) 27.0-27.9, adult: Secondary | ICD-10-CM | POA: Diagnosis not present

## 2022-11-18 LAB — GLUCOSE, CAPILLARY: Glucose-Capillary: 168 mg/dL — ABNORMAL HIGH (ref 70–99)

## 2022-11-18 SURGERY — POSTERIOR LUMBAR FUSION 1 LEVEL
Anesthesia: General | Site: Spine Lumbar

## 2022-11-18 MED ORDER — ROCURONIUM BROMIDE 10 MG/ML (PF) SYRINGE
PREFILLED_SYRINGE | INTRAVENOUS | Status: AC
  Filled 2022-11-18: qty 10

## 2022-11-18 MED ORDER — METHOCARBAMOL 500 MG PO TABS
ORAL_TABLET | ORAL | Status: AC
  Filled 2022-11-18: qty 1

## 2022-11-18 MED ORDER — MIDAZOLAM HCL 2 MG/2ML IJ SOLN
INTRAMUSCULAR | Status: AC
  Filled 2022-11-18: qty 2

## 2022-11-18 MED ORDER — ACETAMINOPHEN 500 MG PO TABS: 1000.0000 mg | ORAL_TABLET | Freq: Once | ORAL | Status: DC | PRN

## 2022-11-18 MED ORDER — MIDAZOLAM HCL 2 MG/2ML IJ SOLN
INTRAMUSCULAR | Status: DC | PRN
  Administered 2022-11-18: 2 mg via INTRAVENOUS

## 2022-11-18 MED ORDER — VITAMIN D (ERGOCALCIFEROL) 1.25 MG (50000 UNIT) PO CAPS
50000.0000 [IU] | ORAL_CAPSULE | ORAL | Status: DC
  Filled 2022-11-18: qty 1

## 2022-11-18 MED ORDER — HYDROMORPHONE HCL 1 MG/ML IJ SOLN
INTRAMUSCULAR | Status: AC
  Filled 2022-11-18: qty 0.5

## 2022-11-18 MED ORDER — ACETAMINOPHEN 650 MG RE SUPP: 650.0000 mg | RECTAL | Status: DC | PRN

## 2022-11-18 MED ORDER — BUPIVACAINE LIPOSOME 1.3 % IJ SUSP
INTRAMUSCULAR | Status: AC
  Filled 2022-11-18: qty 20

## 2022-11-18 MED ORDER — ONDANSETRON HCL 4 MG PO TABS: 4.0000 mg | ORAL_TABLET | Freq: Four times a day (QID) | ORAL | Status: DC | PRN

## 2022-11-18 MED ORDER — ALUM & MAG HYDROXIDE-SIMETH 200-200-20 MG/5ML PO SUSP: 30.0000 mL | Freq: Four times a day (QID) | ORAL | Status: DC | PRN

## 2022-11-18 MED ORDER — OXYBUTYNIN CHLORIDE ER 10 MG PO TB24
10.0000 mg | ORAL_TABLET | Freq: Every day | ORAL | Status: DC
  Administered 2022-11-18: 10 mg via ORAL
  Filled 2022-11-18: qty 1

## 2022-11-18 MED ORDER — DEXAMETHASONE SODIUM PHOSPHATE 10 MG/ML IJ SOLN
INTRAMUSCULAR | Status: DC | PRN
  Administered 2022-11-18: 10 mg via INTRAVENOUS

## 2022-11-18 MED ORDER — PROPOFOL 10 MG/ML IV BOLUS
INTRAVENOUS | Status: DC | PRN
  Administered 2022-11-18: 130 mg via INTRAVENOUS
  Administered 2022-11-18: 70 mg via INTRAVENOUS

## 2022-11-18 MED ORDER — LIDOCAINE-EPINEPHRINE 1 %-1:100000 IJ SOLN
INTRAMUSCULAR | Status: DC | PRN
  Administered 2022-11-18: 10 mL

## 2022-11-18 MED ORDER — FENTANYL CITRATE (PF) 100 MCG/2ML IJ SOLN
25.0000 ug | INTRAMUSCULAR | Status: DC | PRN
  Administered 2022-11-18 (×3): 50 ug via INTRAVENOUS

## 2022-11-18 MED ORDER — CELECOXIB 200 MG PO CAPS: 200.0000 mg | ORAL_CAPSULE | Freq: Two times a day (BID) | ORAL | Status: DC | PRN

## 2022-11-18 MED ORDER — ZINC GLUCONATE 50 MG PO TABS: 50.0000 mg | ORAL_TABLET | Freq: Every day | ORAL | Status: DC

## 2022-11-18 MED ORDER — SODIUM CHLORIDE 0.9% FLUSH: 3.0000 mL | Freq: Two times a day (BID) | INTRAVENOUS | Status: DC

## 2022-11-18 MED ORDER — PROPOFOL 10 MG/ML IV BOLUS
INTRAVENOUS | Status: AC
  Filled 2022-11-18: qty 20

## 2022-11-18 MED ORDER — ACETAMINOPHEN 10 MG/ML IV SOLN: 1000.0000 mg | Freq: Once | INTRAVENOUS | Status: DC | PRN

## 2022-11-18 MED ORDER — OXYCODONE HCL 5 MG PO TABS
5.0000 mg | ORAL_TABLET | Freq: Once | ORAL | Status: AC | PRN
Start: 2022-11-18 — End: 2022-11-18
  Administered 2022-11-18: 5 mg via ORAL

## 2022-11-18 MED ORDER — VITAMIN D 25 MCG (1000 UNIT) PO TABS: 2000.0000 [IU] | ORAL_TABLET | Freq: Every day | ORAL | Status: DC

## 2022-11-18 MED ORDER — VITAMIN C 500 MG PO TABS: 500.0000 mg | ORAL_TABLET | Freq: Every day | ORAL | Status: DC

## 2022-11-18 MED ORDER — ROCURONIUM BROMIDE 10 MG/ML (PF) SYRINGE
PREFILLED_SYRINGE | INTRAVENOUS | Status: DC | PRN
  Administered 2022-11-18: 70 mg via INTRAVENOUS
  Administered 2022-11-18: 10 mg via INTRAVENOUS
  Administered 2022-11-18: 20 mg via INTRAVENOUS

## 2022-11-18 MED ORDER — HYDROCODONE-ACETAMINOPHEN 5-325 MG PO TABS
ORAL_TABLET | ORAL | Status: AC
  Filled 2022-11-18: qty 1

## 2022-11-18 MED ORDER — FENTANYL CITRATE (PF) 250 MCG/5ML IJ SOLN
INTRAMUSCULAR | Status: DC | PRN
  Administered 2022-11-18: 100 ug via INTRAVENOUS
  Administered 2022-11-18: 50 ug via INTRAVENOUS
  Administered 2022-11-18: 25 ug via INTRAVENOUS
  Administered 2022-11-18: 50 ug via INTRAVENOUS
  Administered 2022-11-18: 25 ug via INTRAVENOUS

## 2022-11-18 MED ORDER — LORATADINE 10 MG PO TABS: 10.0000 mg | ORAL_TABLET | Freq: Every day | ORAL | Status: DC

## 2022-11-18 MED ORDER — METHOCARBAMOL 500 MG PO TABS
500.0000 mg | ORAL_TABLET | Freq: Three times a day (TID) | ORAL | Status: DC | PRN
  Administered 2022-11-18 – 2022-11-19 (×3): 500 mg via ORAL
  Filled 2022-11-18 (×2): qty 1

## 2022-11-18 MED ORDER — FENTANYL CITRATE (PF) 100 MCG/2ML IJ SOLN
INTRAMUSCULAR | Status: AC
  Filled 2022-11-18: qty 2

## 2022-11-18 MED ORDER — LIDOCAINE-EPINEPHRINE 1 %-1:100000 IJ SOLN
INTRAMUSCULAR | Status: AC
  Filled 2022-11-18: qty 1

## 2022-11-18 MED ORDER — OXYCODONE HCL 5 MG/5ML PO SOLN: 5.0000 mg | Freq: Once | ORAL | Status: AC | PRN

## 2022-11-18 MED ORDER — PHENOL 1.4 % MT LIQD: 1.0000 | OROMUCOSAL | Status: DC | PRN

## 2022-11-18 MED ORDER — PROGESTERONE MICRONIZED 100 MG PO CAPS
100.0000 mg | ORAL_CAPSULE | ORAL | Status: DC
Start: 2022-11-18 — End: 2022-11-18

## 2022-11-18 MED ORDER — EZETIMIBE 10 MG PO TABS: 10.0000 mg | ORAL_TABLET | Freq: Every day | ORAL | Status: DC

## 2022-11-18 MED ORDER — ACETAMINOPHEN 160 MG/5ML PO SOLN: 1000.0000 mg | Freq: Once | ORAL | Status: DC | PRN

## 2022-11-18 MED ORDER — SPIRONOLACTONE 12.5 MG HALF TABLET
12.5000 mg | ORAL_TABLET | Freq: Every day | ORAL | Status: DC
  Filled 2022-11-18 (×2): qty 1

## 2022-11-18 MED ORDER — IRBESARTAN 75 MG PO TABS: 37.5000 mg | ORAL_TABLET | Freq: Every day | ORAL | Status: DC

## 2022-11-18 MED ORDER — CEFAZOLIN SODIUM-DEXTROSE 2-4 GM/100ML-% IV SOLN
2.0000 g | Freq: Three times a day (TID) | INTRAVENOUS | Status: DC
  Administered 2022-11-18 – 2022-11-19 (×3): 2 g via INTRAVENOUS
  Filled 2022-11-18 (×3): qty 100

## 2022-11-18 MED ORDER — OYSTER SHELL CALCIUM/D3 500-5 MG-MCG PO TABS
1.0000 | ORAL_TABLET | Freq: Every day | ORAL | Status: DC
  Administered 2022-11-19: 1 via ORAL
  Filled 2022-11-18: qty 1

## 2022-11-18 MED ORDER — THROMBIN 5000 UNITS EX SOLR: OROMUCOSAL | Status: DC | PRN

## 2022-11-18 MED ORDER — BUPIVACAINE LIPOSOME 1.3 % IJ SUSP
INTRAMUSCULAR | Status: DC | PRN
  Administered 2022-11-18: 20 mL

## 2022-11-18 MED ORDER — ACETAMINOPHEN 10 MG/ML IV SOLN
INTRAVENOUS | Status: AC
  Filled 2022-11-18: qty 100

## 2022-11-18 MED ORDER — POLYVINYL ALCOHOL 1.4 % OP SOLN: 1.0000 [drp] | Freq: Three times a day (TID) | OPHTHALMIC | Status: DC | PRN

## 2022-11-18 MED ORDER — THROMBIN 5000 UNITS EX SOLR
CUTANEOUS | Status: AC
  Filled 2022-11-18: qty 5000

## 2022-11-18 MED ORDER — ONDANSETRON HCL 4 MG/2ML IJ SOLN
INTRAMUSCULAR | Status: AC
  Filled 2022-11-18: qty 2

## 2022-11-18 MED ORDER — ACETAMINOPHEN 10 MG/ML IV SOLN
INTRAVENOUS | Status: DC | PRN
  Administered 2022-11-18: 1000 mg via INTRAVENOUS

## 2022-11-18 MED ORDER — DEXAMETHASONE SODIUM PHOSPHATE 10 MG/ML IJ SOLN
INTRAMUSCULAR | Status: AC
  Filled 2022-11-18: qty 1

## 2022-11-18 MED ORDER — HYDROCODONE-ACETAMINOPHEN 5-325 MG PO TABS
2.0000 | ORAL_TABLET | ORAL | Status: DC | PRN
  Administered 2022-11-18 – 2022-11-19 (×5): 2 via ORAL
  Filled 2022-11-18 (×4): qty 2

## 2022-11-18 MED ORDER — SUGAMMADEX SODIUM 200 MG/2ML IV SOLN
INTRAVENOUS | Status: DC | PRN
  Administered 2022-11-18: 200 mg via INTRAVENOUS

## 2022-11-18 MED ORDER — BUPIVACAINE HCL (PF) 0.25 % IJ SOLN
INTRAMUSCULAR | Status: AC
  Filled 2022-11-18: qty 30

## 2022-11-18 MED ORDER — ACETAMINOPHEN 500 MG PO TABS: 500.0000 mg | ORAL_TABLET | Freq: Four times a day (QID) | ORAL | Status: DC | PRN

## 2022-11-18 MED ORDER — FENTANYL CITRATE (PF) 250 MCG/5ML IJ SOLN
INTRAMUSCULAR | Status: AC
  Filled 2022-11-18: qty 5

## 2022-11-18 MED ORDER — THROMBIN 20000 UNITS EX SOLR
CUTANEOUS | Status: AC
  Filled 2022-11-18: qty 20000

## 2022-11-18 MED ORDER — SODIUM CHLORIDE 0.9% FLUSH: 3.0000 mL | INTRAVENOUS | Status: DC | PRN

## 2022-11-18 MED ORDER — ONDANSETRON HCL 4 MG/2ML IJ SOLN: 4.0000 mg | Freq: Four times a day (QID) | INTRAMUSCULAR | Status: DC | PRN

## 2022-11-18 MED ORDER — DIPHENHYDRAMINE HCL 12.5 MG/5ML PO ELIX
50.0000 mg | ORAL_SOLUTION | Freq: Every day | ORAL | Status: DC
  Administered 2022-11-18: 50 mg via ORAL
  Filled 2022-11-18: qty 20

## 2022-11-18 MED ORDER — THROMBIN 20000 UNITS EX SOLR: CUTANEOUS | Status: DC | PRN

## 2022-11-18 MED ORDER — ONDANSETRON HCL 4 MG/2ML IJ SOLN
INTRAMUSCULAR | Status: DC | PRN
  Administered 2022-11-18: 4 mg via INTRAVENOUS

## 2022-11-18 MED ORDER — ALBUMIN HUMAN 5 % IV SOLN: INTRAVENOUS | Status: DC | PRN

## 2022-11-18 MED ORDER — KETOROLAC TROMETHAMINE 30 MG/ML IJ SOLN
INTRAMUSCULAR | Status: AC
  Filled 2022-11-18: qty 1

## 2022-11-18 MED ORDER — ADULT MULTIVITAMIN W/MINERALS CH
1.0000 | ORAL_TABLET | Freq: Every day | ORAL | Status: DC
  Administered 2022-11-19: 1 via ORAL
  Filled 2022-11-18: qty 1

## 2022-11-18 MED ORDER — MENTHOL 3 MG MT LOZG: 1.0000 | LOZENGE | OROMUCOSAL | Status: DC | PRN

## 2022-11-18 MED ORDER — HYDROMORPHONE HCL 1 MG/ML IJ SOLN: 0.5000 mg | INTRAMUSCULAR | Status: DC | PRN

## 2022-11-18 MED ORDER — ACETAMINOPHEN 325 MG PO TABS: 650.0000 mg | ORAL_TABLET | ORAL | Status: DC | PRN

## 2022-11-18 MED ORDER — PANTOPRAZOLE SODIUM 40 MG PO TBEC
40.0000 mg | DELAYED_RELEASE_TABLET | Freq: Every day | ORAL | Status: DC
  Administered 2022-11-18: 40 mg via ORAL
  Filled 2022-11-18: qty 1

## 2022-11-18 MED ORDER — HYDROMORPHONE HCL 1 MG/ML IJ SOLN
INTRAMUSCULAR | Status: DC | PRN
  Administered 2022-11-18: .5 mg via INTRAVENOUS

## 2022-11-18 MED ORDER — CHLORHEXIDINE GLUCONATE CLOTH 2 % EX PADS: 6.0000 | MEDICATED_PAD | Freq: Once | CUTANEOUS | Status: DC

## 2022-11-18 MED ORDER — ORAL CARE MOUTH RINSE: 15.0000 mL | Freq: Once | OROMUCOSAL | Status: AC

## 2022-11-18 MED ORDER — 0.9 % SODIUM CHLORIDE (POUR BTL) OPTIME
TOPICAL | Status: DC | PRN
  Administered 2022-11-18: 1000 mL

## 2022-11-18 MED ORDER — LEVOTHYROXINE SODIUM 88 MCG PO TABS
88.0000 ug | ORAL_TABLET | Freq: Every day | ORAL | Status: DC
  Administered 2022-11-19: 88 ug via ORAL
  Filled 2022-11-18: qty 1

## 2022-11-18 MED ORDER — PANTOPRAZOLE SODIUM 40 MG IV SOLR: 40.0000 mg | Freq: Every day | INTRAVENOUS | Status: DC

## 2022-11-18 MED ORDER — SODIUM CHLORIDE 0.9% FLUSH
10.0000 mL | Freq: Two times a day (BID) | INTRAVENOUS | Status: DC
  Administered 2022-11-18 (×2): 10 mL via INTRAVENOUS

## 2022-11-18 MED ORDER — OXYCODONE HCL 5 MG PO TABS
ORAL_TABLET | ORAL | Status: AC
  Administered 2022-11-18: 10 mg
  Filled 2022-11-18: qty 1

## 2022-11-18 MED ORDER — PHENYLEPHRINE 80 MCG/ML (10ML) SYRINGE FOR IV PUSH (FOR BLOOD PRESSURE SUPPORT)
PREFILLED_SYRINGE | INTRAVENOUS | Status: DC | PRN
  Administered 2022-11-18: 100 ug via INTRAVENOUS
  Administered 2022-11-18 (×2): 80 ug via INTRAVENOUS

## 2022-11-18 MED ORDER — CEFAZOLIN SODIUM-DEXTROSE 2-3 GM-%(50ML) IV SOLR
INTRAVENOUS | Status: DC | PRN
  Administered 2022-11-18: 2 g via INTRAVENOUS

## 2022-11-18 MED ORDER — CHLORHEXIDINE GLUCONATE 0.12 % MT SOLN
15.0000 mL | Freq: Once | OROMUCOSAL | Status: AC
  Administered 2022-11-18: 15 mL via OROMUCOSAL
  Filled 2022-11-18: qty 15

## 2022-11-18 MED ORDER — LACTATED RINGERS IV SOLN: INTRAVENOUS | Status: DC

## 2022-11-18 MED ORDER — TURMERIC CURCUMIN PO CAPS: ORAL_CAPSULE | Freq: Every day | ORAL | Status: DC

## 2022-11-18 SURGICAL SUPPLY — 69 items
ADH SKN CLS APL DERMABOND .7 (GAUZE/BANDAGES/DRESSINGS) ×1
APL SKNCLS STERI-STRIP NONHPOA (GAUZE/BANDAGES/DRESSINGS) ×1
BAG COUNTER SPONGE SURGICOUNT (BAG) ×1 IMPLANT
BAG SPNG CNTER NS LX DISP (BAG) ×1
BASKET BONE COLLECTION (BASKET) ×1 IMPLANT
BENZOIN TINCTURE PRP APPL 2/3 (GAUZE/BANDAGES/DRESSINGS) ×1 IMPLANT
BLADE BONE MILL MEDIUM (MISCELLANEOUS) ×1 IMPLANT
BLADE CLIPPER SURG (BLADE) IMPLANT
BLADE SURG 11 STRL SS (BLADE) ×1 IMPLANT
BONE VIVIGEN FORMABLE 5.4CC (Bone Implant) ×1 IMPLANT
BUR CUTTER 7.0 ROUND (BURR) ×1 IMPLANT
BUR MATCHSTICK NEURO 3.0 LAGG (BURR) ×1 IMPLANT
CANISTER SUCT 3000ML PPV (MISCELLANEOUS) ×1 IMPLANT
CAP RELINE MOD TULIP RMM (Cap) IMPLANT
CNTNR URN SCR LID CUP LEK RST (MISCELLANEOUS) ×1 IMPLANT
CONT SPEC 4OZ STRL OR WHT (MISCELLANEOUS) ×1
COVER BACK TABLE 60X90IN (DRAPES) ×1 IMPLANT
DERMABOND ADVANCED .7 DNX12 (GAUZE/BANDAGES/DRESSINGS) ×1 IMPLANT
DRAPE C-ARM 42X72 X-RAY (DRAPES) ×2 IMPLANT
DRAPE C-ARMOR (DRAPES) IMPLANT
DRAPE HALF SHEET 40X57 (DRAPES) IMPLANT
DRAPE LAPAROTOMY 100X72X124 (DRAPES) ×1 IMPLANT
DRAPE SURG 17X23 STRL (DRAPES) ×1 IMPLANT
DRSG OPSITE 4X5.5 SM (GAUZE/BANDAGES/DRESSINGS) ×1 IMPLANT
DRSG OPSITE POSTOP 4X6 (GAUZE/BANDAGES/DRESSINGS) ×1 IMPLANT
DURAPREP 26ML APPLICATOR (WOUND CARE) ×1 IMPLANT
ELECT REM PT RETURN 9FT ADLT (ELECTROSURGICAL) ×1
ELECTRODE REM PT RTRN 9FT ADLT (ELECTROSURGICAL) ×1 IMPLANT
EVACUATOR 1/8 PVC DRAIN (DRAIN) IMPLANT
EVACUATOR 3/16 PVC DRAIN (DRAIN) IMPLANT
GAUZE 4X4 16PLY ~~LOC~~+RFID DBL (SPONGE) IMPLANT
GAUZE SPONGE 4X4 12PLY STRL (GAUZE/BANDAGES/DRESSINGS) ×1 IMPLANT
GLOVE BIO SURGEON STRL SZ7 (GLOVE) IMPLANT
GLOVE BIO SURGEON STRL SZ8 (GLOVE) ×2 IMPLANT
GLOVE BIOGEL PI IND STRL 7.0 (GLOVE) IMPLANT
GLOVE EXAM NITRILE XL STR (GLOVE) IMPLANT
GLOVE INDICATOR 8.5 STRL (GLOVE) ×2 IMPLANT
GOWN STRL REUS W/ TWL LRG LVL3 (GOWN DISPOSABLE) IMPLANT
GOWN STRL REUS W/ TWL XL LVL3 (GOWN DISPOSABLE) ×2 IMPLANT
GOWN STRL REUS W/TWL 2XL LVL3 (GOWN DISPOSABLE) IMPLANT
GOWN STRL REUS W/TWL LRG LVL3 (GOWN DISPOSABLE)
GOWN STRL REUS W/TWL XL LVL3 (GOWN DISPOSABLE) ×2
GRAFT BNE MATRIX VG FRMBL MD 5 (Bone Implant) IMPLANT
KIT BASIN OR (CUSTOM PROCEDURE TRAY) ×1 IMPLANT
KIT GRAFTMAG DEL NEURO DISP (NEUROSURGERY SUPPLIES) IMPLANT
KIT TURNOVER KIT B (KITS) ×1 IMPLANT
MILL BONE PREP (MISCELLANEOUS) ×1 IMPLANT
NDL HYPO 25X1 1.5 SAFETY (NEEDLE) ×1 IMPLANT
NEEDLE HYPO 25X1 1.5 SAFETY (NEEDLE) ×1 IMPLANT
NS IRRIG 1000ML POUR BTL (IV SOLUTION) ×1 IMPLANT
PACK LAMINECTOMY NEURO (CUSTOM PROCEDURE TRAY) ×1 IMPLANT
PAD ARMBOARD 7.5X6 YLW CONV (MISCELLANEOUS) ×3 IMPLANT
ROD RELINE 5.0X45MM (Rod) IMPLANT
SCREW LOCK RSS 4.5/5.0MM (Screw) IMPLANT
SCREW SHANK RELINE MOD 5.5X30 (Screw) IMPLANT
SPACER SUSTAIN TI 9X22X12 8D (Spacer) IMPLANT
SPIKE FLUID TRANSFER (MISCELLANEOUS) ×1 IMPLANT
SPONGE SURGIFOAM ABS GEL 100 (HEMOSTASIS) ×1 IMPLANT
SPONGE T-LAP 4X18 ~~LOC~~+RFID (SPONGE) IMPLANT
STRIP CLOSURE SKIN 1/2X4 (GAUZE/BANDAGES/DRESSINGS) ×2 IMPLANT
STRIP CLOSURE SKIN 1/4X4 (GAUZE/BANDAGES/DRESSINGS) IMPLANT
SUT VIC AB 0 CT1 18XCR BRD8 (SUTURE) ×2 IMPLANT
SUT VIC AB 0 CT1 8-18 (SUTURE) ×2
SUT VIC AB 2-0 CT1 18 (SUTURE) ×1 IMPLANT
SUT VIC AB 4-0 PS2 27 (SUTURE) ×1 IMPLANT
TOWEL GREEN STERILE (TOWEL DISPOSABLE) ×1 IMPLANT
TOWEL GREEN STERILE FF (TOWEL DISPOSABLE) ×1 IMPLANT
TRAY FOLEY MTR SLVR 16FR STAT (SET/KITS/TRAYS/PACK) ×1 IMPLANT
WATER STERILE IRR 1000ML POUR (IV SOLUTION) ×1 IMPLANT

## 2022-11-18 NOTE — Op Note (Signed)
Preoperative diagnosis: Grade 1 spondylolisthesis L4-5 synovial cyst L4-5 left left L5 radiculopathy with foot drop and severe spinal stenosis L4-5.  Postoperative diagnosis: Same.  Procedure: #1 decompressive lumbar laminectomy with complete medial facetectomies and radical foraminotomies of the L4 and L5 nerve root with resection of synovial cyst in excess and requiring more work with them with would be needed with a standard interbody fusion.  2.  Posterior lumbar interbody fusion L4-5 utilizing the globus insert and rotate titanium cages packed with locally harvested autograft.  Mixed with Vivigen.  3.  Cortical screw fixation L4-5 utilizing the NuVasive cortical modular screw set.  Surgeon: Donalee Citrin.  Anesthesia: General.  EBL: Minimal.  HPI: Patient is a 67 year old female presented with a left-sided foot drop back and left leg pain workup revealed grade 1 spondylolisthesis is dynamic on flexion-extension x-rays with synovial cyst extending into the left L5 foramen.  Due to the patient's progression of clinical syndrome imaging findings failed conservative treatment I recommended decompressive laminectomy and interbody fusion at that level.  I extensively reviewed the risks and benefits of the operation with the patient as well as perioperative course expectations of outcome and alternatives of surgery and she understood and agreed to proceed forward.  Operative procedure: Patient was brought into the OR was induced under general anesthesia positioned prone the Wilson frame her back was prepped and draped in routine sterile fashion.  Preoperative x-ray localized the appropriate level so after infiltration of 10 cc lidocaine with epi midline incision was made and Bovie electrocautery was used take down the subcutaneous tissue and subperiosteal dissection was carried lamina of L4 and L5 bilaterally there was marked large external synovial cysts coming off the 4 5 facet joints bilaterally these  were all removed interoperative x-ray confirmed defecation appropriate level so the spinous process at L4 was removed facets were drilled down with there were noted to be markedly diastased central decompression was begun with a 3 Miller Kerrison punch.  Complete medial facetectomies were performed bilaterally and inferomedial aspect of each L4 pedicle was drilled down to extend out the L4 foramen then the large left-sided synovial cyst was identified this was all removed and teased off of the dura there was a cyst membrane extending inferiorly out the L5 foramen and marched into the L5 lamina and out the L L5 foramen distal to the L5 pedicle to ensure adequate decompression I remove the vast majority of the membrane but there was some of it that was densely tearing to the dura so I made sure was well decompressed around it easily excepting in the foramen a coronary dilator.  After adequate neural decompression was achieved attention was taken to interbody work the space was cleaned out endplates were prepared I sized up a 9-12 8 to 8 degree titanium cages packed with locally harvested autograft and inserted them cortical screws were placed in routine fashion all screws had excellent purchase postop imaging confirmed good position of all the implants.  I did pack an extensive mount of autograft mix between the cages then the heads were assembled L4 was compressed against L5 the rods and screws all noted to be in good position and free and clear without compression the foramina were all reinspected and Gelfoam was packed along the dura a medium Hemovac drain was placed Exparel was injected in the fascia and the wound was closed in layers with active Vicryl and a running 4 subcuticular.  Dermabond benzoin Steri-Strips and a sterile dressing was applied and patient  recovery in stable condition.  At the end the case all needle count sponge counts were correct.

## 2022-11-18 NOTE — Transfer of Care (Signed)
Immediate Anesthesia Transfer of Care Note  Patient: Megan Savage  Procedure(s) Performed: Decompression Lumbar Laminecotomy and Fusion, Lumbar Four-Lumbar Five with Cages and Screws (Spine Lumbar)  Patient Location: PACU  Anesthesia Type:General  Level of Consciousness: awake, alert , and oriented  Airway & Oxygen Therapy: Patient Spontanous Breathing  Post-op Assessment: Report given to RN, Post -op Vital signs reviewed and stable, and Patient moving all extremities X 4  Post vital signs: Reviewed and stable  Last Vitals:  Vitals Value Taken Time  BP 148/70 11/18/22 1050  Temp    Pulse 85 11/18/22 1051  Resp 16 11/18/22 1051  SpO2 99 % 11/18/22 1051  Vitals shown include unfiled device data.  Last Pain:  Vitals:   11/18/22 0608  TempSrc:   PainSc: 2          Complications: No notable events documented.

## 2022-11-18 NOTE — Anesthesia Procedure Notes (Signed)
Procedure Name: Intubation Date/Time: 11/18/2022 7:52 AM  Performed by: Rachel Moulds, CRNAPre-anesthesia Checklist: Patient identified, Emergency Drugs available, Suction available and Patient being monitored Patient Re-evaluated:Patient Re-evaluated prior to induction Oxygen Delivery Method: Circle system utilized Preoxygenation: Pre-oxygenation with 100% oxygen Induction Type: IV induction Ventilation: Mask ventilation without difficulty Laryngoscope Size: Mac and 3 Grade View: Grade I Tube type: Oral Number of attempts: 1 Airway Equipment and Method: Stylet and Oral airway Placement Confirmation: ETT inserted through vocal cords under direct vision, positive ETCO2 and breath sounds checked- equal and bilateral Secured at: 22 cm Tube secured with: Tape Dental Injury: Teeth and Oropharynx as per pre-operative assessment  Comments: Intubation by SRNA C. Holmes under supervision by MD Maple Hudson

## 2022-11-18 NOTE — H&P (Signed)
Megan Savage is an 67 y.o. female.   Chief Complaint: Back left leg pain and weakness HPI: 67 year old female with back and left leg pain and weakness workup revealed a large synovial cyst causing displacement of left L5 nerve root patient presented with a foot drop and back pain with instability and.  Due to the patient's progression of clinical syndrome imaging findings and failed conservative treatment I recommended decompressive laminectomy interbody fusion at L4-5.  I extensively reviewed the risks and benefits of the operation with the patient as well as perioperative course expectations of outcome and alternatives to surgery and she understood and agreed to proceed forward.  Past Medical History:  Diagnosis Date   Anemia    Arthritis    Asthma    Very Mild   Bronchitis    Depression    denies, borther passed away   Fatigue    resolved    Heart murmur    Hypercholesterolemia    Hyperlipidemia    controlled    Hypertension    controlled   Hypothyroidism    Insomnia    Obesity    Skin disorder    rosacea    Past Surgical History:  Procedure Laterality Date   APPENDECTOMY  1976   BREAST BIOPSY Right 04/07/2022   MM RT BREAST BX W LOC DEV 1ST LESION IMAGE BX SPEC STEREO GUIDE 04/07/2022 [facility]   BREAST IMPLANT REMOVAL Bilateral    implanted 1996 removed 2002   CARPAL TUNNEL RELEASE     bilateral    ENDOMETRIAL FULGURATION  1992   foot surgery      tumor removed (left)    HYSTEROSCOPY WITH D & C N/A 06/15/2018   Procedure: DILATATION AND CURETTAGE /HYSTEROSCOPY;  Surgeon: Marcelle Overlie, MD;  Location: [facility];  Service: Gynecology;  Laterality: N/A;   hysterscopy      I & D EXTREMITY Left 03/09/2022   Procedure: IRRIGATION AND DEBRIDEMENT LEFT KNEE WITH PRIMARY CLOSURE;  Surgeon: Durene Romans, MD;  Location: [facility];  Service: Orthopedics;  Laterality: Left;   OOPHORECTOMY  1976   left ovary removed    TOTAL HIP ARTHROPLASTY Right 08/27/2021    Procedure: TOTAL HIP ARTHROPLASTY ANTERIOR APPROACH;  Surgeon: Durene Romans, MD;  Location: [facility];  Service: Orthopedics;  Laterality: Right;   TOTAL KNEE ARTHROPLASTY Left 02/18/2022   Procedure: TOTAL KNEE ARTHROPLASTY;  Surgeon: Durene Romans, MD;  Location: [facility];  Service: Orthopedics;  Laterality: Left;   TOTAL SHOULDER ARTHROPLASTY Right 09/17/2018   Procedure: TOTAL SHOULDER ARTHROPLASTY anatomical;  Surgeon: Beverely Low, MD;  Location: [facility];  Service: Orthopedics;  Laterality: Right;    Family History  Problem Relation Age of Onset   Arrhythmia Mother    Emphysema Mother    Asthma Mother    Cancer Father    Asthma Paternal Aunt    Cancer Brother        leumkemia   Asthma Brother    Breast cancer Neg Hx    Social History:  reports that she has never smoked. She has never used smokeless tobacco. She reports current alcohol use. She reports that she does not use drugs.  Allergies:  Allergies  Allergen Reactions   Cat Hair Extract Other (See Comments)    Watery eyes    Medications Prior to Admission  Medication Sig Dispense Refill   acetaminophen (TYLENOL) 500 MG tablet Take 500 mg by mouth every 6 (six) hours as needed for mild pain (pain  score 1-3).     Ascorbic Acid (VITAMIN C) 500 MG CAPS Take 500 mg by mouth daily.     CALCIUM CITRATE-VITAMIN D PO Take 2 tablets by mouth daily with breakfast.  500 mg / 400 Units     celecoxib (CELEBREX) 200 MG capsule Take 200 mg by mouth 2 (two) times daily as needed for moderate pain (pain score 4-6).     Cholecalciferol (VITAMIN D3) 50 MCG (2000 UT) TABS Take 2,000 Units by mouth daily.     Choline Fenofibrate (FENOFIBRIC ACID) 135 MG CPDR TAKE ONE CAPSULE EACH DAY 90 capsule 2   estradiol (VIVELLE-DOT) 0.025 MG/24HR Place 1 patch onto the skin See admin instructions.  Place 1 patch onto the skin on Tuesdays and Fridays- after removing the old one     ezetimibe (ZETIA) 10 MG tablet TAKE ONE TABLET DAILY (Patient taking  differently: Take 10 mg by mouth daily.) 90 tablet 3   fexofenadine (ALLEGRA) 180 MG tablet Take 180 mg by mouth daily.     levothyroxine (SYNTHROID) 88 MCG tablet TAKE ONE TABLET DAILY BEFORE BREAKFAST 90 tablet 2   methocarbamol (ROBAXIN) 500 MG tablet Take 500 mg by mouth every 8 (eight) hours as needed for muscle spasms.     multivitamin (THERAGRAN) per tablet Take 1 tablet by mouth daily with breakfast.     olmesartan (BENICAR) 20 MG tablet TAKE ONE TABLET BY MOUTH ONCE DAILY (Patient taking differently: Take 20 mg by mouth daily.) 90 tablet 3   Omega-3 Fatty Acids (FISH OIL PO) Take 1,400 mg by mouth daily.     oxybutynin (DITROPAN-XL) 10 MG 24 hr tablet Take 10 mg by mouth at bedtime.     progesterone (PROMETRIUM) 100 MG capsule Take 100 mg by mouth See admin instructions. Take 100 mg by mouth on days 1-10 of every month     Propylene Glycol, PF, (SYSTANE COMPLETE PF) 0.6 % SOLN Place 1 drop into both eyes 3 (three) times daily as needed (for dryness).     spironolactone (ALDACTONE) 25 MG tablet Take 0.5 tablets (12.5 mg total) by mouth daily. 45 tablet 3   TURMERIC CURCUMIN PO Take 500 mg by mouth daily.     Vitamin D, Ergocalciferol, (DRISDOL) 50000 units CAPS capsule Take 50,000 Units by mouth every 30 (thirty) days.      zinc gluconate 50 MG tablet Take 50 mg by mouth daily.     diphenhydrAMINE HCl, Sleep, (ZZZQUIL) 50 MG/30ML LIQD Take 50 mg by mouth at bedtime.      Results for orders placed or performed during the hospital encounter of 11/18/22 (from the past 48 hour(s))  Glucose, capillary     Status: Abnormal   Collection Time: 11/18/22  6:29 AM  Result Value Ref Range   Glucose-Capillary 168 (H) 70 - 99 mg/dL    Comment: Glucose reference range applies only to samples taken after fasting for at least 8 hours.   No results found.  Review of Systems  Musculoskeletal:  Positive for back pain.  Neurological:  Positive for weakness.    Blood pressure (!) 162/77, pulse 89,  temperature 98.3 F (36.8 C), temperature source Oral, resp. rate 18, height 5\' 4"  (1.626 m), weight 72.6 kg, SpO2 96%. Physical Exam HENT:     Head: Normocephalic.     Right Ear: Tympanic membrane normal.     Nose: Nose normal.     Mouth/Throat:     Mouth: Mucous membranes are moist.  Eyes:  Pupils: Pupils are equal, round, and reactive to light.  Cardiovascular:     Rate and Rhythm: Normal rate.  Pulmonary:     Effort: Pulmonary effort is normal.  Abdominal:     General: Abdomen is flat.  Musculoskeletal:        General: Normal range of motion.     Cervical back: Normal range of motion.  Neurological:     Mental Status: She is alert.     Comments: Strength is 5 out of 5 iliopsoas, quads, hamstrings, gastrocs, into tibialis, EHL on the right leg left lower extremity has a left-sided foot drop at 4 out of 5 EHL 4- out of 5 otherwise left lower extremity is 5 out of 5      Assessment/Plan 67 year old presents for decompressive laminectomy interbody fusion L4-5  Mariam Dollar, MD 11/18/2022, 7:34 AM

## 2022-11-18 NOTE — Progress Notes (Signed)
PHARMACIST - PHYSICIAN ORDER COMMUNICATION  CONCERNING: P&T Medication Policy on Herbal Medications  DESCRIPTION:  This patient's order for:  zinc gluconate and Turmeric  has been noted.  This product(s) is classified as an "herbal" or natural product. Due to a lack of definitive safety studies or FDA approval, nonstandard manufacturing practices, plus the potential risk of unknown drug-drug interactions while on inpatient medications, the Pharmacy and Therapeutics Committee does not permit the use of "herbal" or natural products of this type within [facility].   ACTION TAKEN: The pharmacy department is unable to verify this order at this time and your patient has been informed of this safety policy. Please reevaluate patient's clinical condition at discharge and address if the herbal or natural product(s) should be resumed at that time.  Christoper Fabian, PharmD, BCPS Please see amion for complete clinical pharmacist phone list 11/18/2022 3:27 PM

## 2022-11-19 DIAGNOSIS — M4316 Spondylolisthesis, lumbar region: Secondary | ICD-10-CM | POA: Diagnosis not present

## 2022-11-19 NOTE — Evaluation (Signed)
Occupational Therapy Evaluation and DC summary  Patient Details Name: Megan Savage MRN: [id number] DOB: 15-Jul-1955 Today's Date: 11/19/2022   History of Present Illness 67 year old female with back and left leg pain and weakness workup revealed a large synovial cyst causing displacement of left L5 nerve root patient presented with a foot drop and back pain with instability. She is now s/p PLIF L4-L5 on 11/18/22. PMH of heart murmur, HLD, HTN, rosacea, L Knee I&D (2/24), R THA (8/23), R TSA (8/20).   Clinical Impression   Pt admitted for above, presents with post op back pain but still demonstrated ability to complete ADLs at mod I level following education of compensatory strategies prn. Pt recalls 3/3 precautions and maintains them during ADL completion. Pt has no further acute skilled OT needs at this time. No follow-up OT needed.        If plan is discharge home, recommend the following: Other (comment) (n/a)    Functional Status Assessment  Patient has not had a recent decline in their functional status  Equipment Recommendations  None recommended by OT    Recommendations for Other Services       Precautions / Restrictions Precautions Precautions: Back Precaution Booklet Issued: Yes (comment) (10/23 from PT) Precaution Comments: no brace needed per orders, has a brace Restrictions Weight Bearing Restrictions: No      Mobility Bed Mobility Overal bed mobility: Modified Independent             General bed mobility comments: recalls log roll without cueing    Transfers Overall transfer level: Modified independent Equipment used: None                      Balance Overall balance assessment: Mild deficits observed, not formally tested                                         ADL either performed or assessed with clinical judgement   ADL                                         General ADL Comments: Pt simulating ADLs  with Mod I level, demonstrated ability to achieve figure four position and simulated LBD and LBB. Discussed compensatory strategies to use at sink to maintain POB precautions and strategies to feed dog. Room level ambulation mod I with RW, return demonstrated understanding of car transfer     Vision         Perception         Praxis         Pertinent Vitals/Pain Pain Assessment Pain Assessment: Faces Faces Pain Scale: Hurts a little bit Pain Location: back Pain Descriptors / Indicators: Operative site guarding, Aching Pain Intervention(s): Limited activity within patient's tolerance, Monitored during session, Repositioned     Extremity/Trunk Assessment Upper Extremity Assessment Upper Extremity Assessment: Overall WFL for tasks assessed   Lower Extremity Assessment Lower Extremity Assessment: Defer to PT evaluation   Cervical / Trunk Assessment Cervical / Trunk Assessment: Back Surgery   Communication Communication Communication: No apparent difficulties   Cognition Arousal: Alert Behavior During Therapy: WFL for tasks assessed/performed Overall Cognitive Status: Within Functional Limits for tasks assessed  General Comments  VSS    Exercises     Shoulder Instructions      Home Living Family/patient expects to be discharged to:: Private residence Living Arrangements: Alone   Type of Home: House Home Access: Stairs to enter [employer] of Steps: 3   Home Layout: Able to live on main level with bedroom/bathroom     Bathroom Shower/Tub: Arts development officer Toilet: Handicapped height     Home Equipment: Agricultural consultant (2 wheels);Cane - single point;Adaptive equipment Adaptive Equipment: Reacher Additional Comments: enjoys golf, has a small dog      Prior Functioning/Environment Prior Level of Function : Independent/Modified Independent;Driving;Working/employed              Mobility Comments: ind ADLs Comments: IND        OT Problem List: Pain      OT Treatment/Interventions:      OT Goals(Current goals can be found in the care plan section) Acute Rehab OT Goals Patient Stated Goal: to go home OT Goal Formulation: With patient Time For Goal Achievement: 12/03/22 Potential to Achieve Goals: Good  OT Frequency:      Co-evaluation              AM-PAC OT "6 Clicks" Daily Activity     Outcome Measure Help from another person eating meals?: None Help from another person taking care of personal grooming?: None Help from another person toileting, which includes using toliet, bedpan, or urinal?: None Help from another person bathing (including washing, rinsing, drying)?: None Help from another person to put on and taking off regular upper body clothing?: None Help from another person to put on and taking off regular lower body clothing?: None 6 Click Score: 24   End of Session Equipment Utilized During Treatment: Rolling walker (2 wheels) Nurse Communication: Mobility status  Activity Tolerance: Patient tolerated treatment well Patient left: in bed  OT Visit Diagnosis: Pain Pain - part of body:  (back)                Time: 8657-8469 OT Time Calculation (min): 13 min Charges:  OT General Charges $OT Visit: 1 Visit OT Evaluation $OT Eval Low Complexity: 1 Low  11/19/2022  AB, OTR/L  Acute Rehabilitation Services  Office: [phone]   Tristan Schroeder 11/19/2022, 9:56 AM

## 2022-11-19 NOTE — Discharge Summary (Signed)
Physician Discharge Summary  Patient ID: Megan Savage MRN: [id number] DOB/AGE: Apr 06, 1955 67 y.o. Estimated body mass index is 27.46 kg/m as calculated from the following:   Height as of this encounter: 5\' 4"  (1.626 m).   Weight as of this encounter: 72.6 kg.   Admit date: 11/18/2022 Discharge date: 11/19/2022  Admission Diagnoses: Grade 1 spondylolisthesis L4-5 synovial cyst L4-5  Discharge Diagnoses: Same Principal Problem:   Spondylolisthesis at L4-L5 level   Discharged Condition: good  Hospital Course: Patient admitted to the hospital underwent decompressive laminectomy resection of synovial cyst and posterior lumbar interbody fusion L4-5.  Postoperatively patient did very well with covering the floor on the floor was ambulating and voiding spontaneously tolerating regular diet and stable for discharge home.  Patient will be discharged scheduled follow-up in 1 to 2 weeks.  Consults: Significant Diagnostic Studies: Treatments: PLIF L4-5 Discharge Exam: Blood pressure (!) 166/75, pulse 85, temperature 99.3 F (37.4 C), temperature source Oral, resp. rate 16, height 5\' 4"  (1.626 m), weight 72.6 kg, SpO2 99%. Strength 5-5 and clean dry and intact  Disposition: Home   Allergies as of 11/19/2022       Reactions   Cat Hair Extract Other (See Comments)   Watery eyes        Medication List     TAKE these medications    acetaminophen 500 MG tablet Commonly known as: TYLENOL Take 500 mg by mouth every 6 (six) hours as needed for mild pain (pain score 1-3).   CALCIUM CITRATE-VITAMIN D PO Take 2 tablets by mouth daily with breakfast.  500 mg / 400 Units   celecoxib 200 MG capsule Commonly known as: CELEBREX Take 200 mg by mouth 2 (two) times daily as needed for moderate pain (pain score 4-6).   estradiol 0.025 MG/24HR Commonly known as: VIVELLE-DOT Place 1 patch onto the skin See admin instructions.  Place 1 patch onto the skin on Tuesdays and Fridays- after  removing the old one   ezetimibe 10 MG tablet Commonly known as: ZETIA TAKE ONE TABLET DAILY   Fenofibric Acid 135 MG Cpdr TAKE ONE CAPSULE EACH DAY   fexofenadine 180 MG tablet Commonly known as: ALLEGRA Take 180 mg by mouth daily.   FISH OIL PO Take 1,400 mg by mouth daily.   levothyroxine 88 MCG tablet Commonly known as: SYNTHROID TAKE ONE TABLET DAILY BEFORE BREAKFAST   methocarbamol 500 MG tablet Commonly known as: ROBAXIN Take 500 mg by mouth every 8 (eight) hours as needed for muscle spasms.   multivitamin per tablet Take 1 tablet by mouth daily with breakfast.   olmesartan 20 MG tablet Commonly known as: BENICAR TAKE ONE TABLET BY MOUTH ONCE DAILY   oxybutynin 10 MG 24 hr tablet Commonly known as: DITROPAN-XL Take 10 mg by mouth at bedtime.   progesterone 100 MG capsule Commonly known as: PROMETRIUM Take 100 mg by mouth See admin instructions. Take 100 mg by mouth on days 1-10 of every month   spironolactone 25 MG tablet Commonly known as: ALDACTONE Take 0.5 tablets (12.5 mg total) by mouth daily.   Systane Complete PF 0.6 % Soln Generic drug: Propylene Glycol (PF) Place 1 drop into both eyes 3 (three) times daily as needed (for dryness).   TURMERIC CURCUMIN PO Take 500 mg by mouth daily.   Vitamin C 500 MG Caps Take 500 mg by mouth daily.   Vitamin D (Ergocalciferol) 1.25 MG (50000 UNIT) Caps capsule Commonly known as: DRISDOL Take 50,000 Units by mouth  every 30 (thirty) days.   Vitamin D3 50 MCG (2000 UT) Tabs Take 2,000 Units by mouth daily.   zinc gluconate 50 MG tablet Take 50 mg by mouth daily.   ZzzQuil 50 MG/30ML Liqd Generic drug: diphenhydrAMINE HCl (Sleep) Take 50 mg by mouth at bedtime.         Signed: Mariam Dollar 11/19/2022, 9:25 AM

## 2022-11-19 NOTE — Plan of Care (Signed)

## 2022-11-19 NOTE — Discharge Instructions (Signed)
Wound Care Keep incision covered and dry until post op day 3. You may remove the Honeycomb dressing on post op day 3. Leave steri-strips on back.  They will fall off by themselves. Do not put any creams, lotions, or ointments on incision. You are fine to shower. Let water run over incision and pat dry.  Activity Activity Walk each and every day, increasing distance each day. No lifting greater than 8 lbs.  No lifting no bending no twisting no driving or riding a car unless coming back and forth to see the doctor. If provided with back brace, wear when out of bed.  It is not necessary to wear brace in bed.  Diet Resume your normal diet.   Return to Work Will be discussed at your follow up appointment.  Call Your Doctor If Any of These Occur Redness, drainage, or swelling at the wound.  Temperature greater than 101 degrees. Severe pain not relieved by pain medication. Incision starts to come apart.  Follow Up Appt Call [phone] if you have one or any problem., gary

## 2022-11-19 NOTE — Evaluation (Signed)
Physical Therapy Evaluation Patient Details Name: CEDRICKA WOLLAN MRN: [id number] DOB: [birth year] Today's Date: 11/19/2022  History of Present Illness  67 year old female with back and left leg pain and weakness workup revealed a large synovial cyst causing displacement of left L5 nerve root patient presented with a foot drop and back pain with instability. She is now s/p PLIF L4-L5 on 11/18/22. PMH of heart murmur, HLD, HTN, rosacea, L Knee I&D (2/24), R THA (8/23), R TSA (8/20).  Clinical Impression  Pt presents to PT mobilizing well s/p PLIF. Pt is able to ambulate for limited community distances with support of RW. Pt negotiates a flight of stairs and reports great improvement in LLE function. Pt expresses good knowledge of back precautions. Pt is encouraged to mobilize frequently at the time of discharge. Pt has no further acute PT needs at this time. Acute PT signing off.        If plan is discharge home, recommend the following:     Can travel by private vehicle        Equipment Recommendations None recommended by PT  Recommendations for Other Services       Functional Status Assessment Patient has had a recent decline in their functional status and demonstrates the ability to make significant improvements in function in a reasonable and predictable amount of time.     Precautions / Restrictions Precautions Precautions: Back Precaution Booklet Issued: Yes (comment) (10/23 from PT) Precaution Comments: no brace needed per orders, has a brace Required Braces or Orthoses: Spinal Brace Spinal Brace: Lumbar corset;Applied in sitting position Restrictions Weight Bearing Restrictions: No      Mobility  Bed Mobility Overal bed mobility: Modified Independent             General bed mobility comments: cues for log roll technique    Transfers Overall transfer level: Modified independent Equipment used: Rolling walker (2 wheels)                     Ambulation/Gait Ambulation/Gait assistance: Modified independent (Device/Increase time) Gait Distance (Feet): 200 Feet Assistive device: Rolling walker (2 wheels) Gait Pattern/deviations: Step-through pattern Gait velocity: functional Gait velocity interpretation: 1.31 - 2.62 ft/sec, indicative of limited community ambulator   General Gait Details: steady step-through gait  Stairs Stairs: Yes Stairs assistance: Modified independent (Device/Increase time) Stair Management: Two rails, Alternating pattern Number of Stairs: 10    Wheelchair Mobility     Tilt Bed    Modified Rankin (Stroke Patients Only)       Balance Overall balance assessment: Modified Independent                                           Pertinent Vitals/Pain Pain Assessment Pain Assessment: 0-10 Pain Score: 1  Pain Location: back Pain Descriptors / Indicators: Sore Pain Intervention(s): Monitored during session    Home Living Family/patient expects to be discharged to:: Private residence Living Arrangements: Alone Available Help at Discharge: Family;Available 24 hours/day Type of Home: House Home Access: Stairs to enter Entrance Stairs-Rails: Left Entrance Stairs-Number of Steps: 3   Home Layout: Able to live on main level with bedroom/bathroom Home Equipment: Agricultural consultant (2 wheels);Cane - single point;Adaptive equipment Additional Comments: enjoys golf, has a small dog    Prior Function Prior Level of Function : Independent/Modified Independent;Driving;Working/employed  Mobility Comments: ind ADLs Comments: IND     Extremity/Trunk Assessment   Upper Extremity Assessment Upper Extremity Assessment: Overall WFL for tasks assessed    Lower Extremity Assessment Lower Extremity Assessment: Defer to PT evaluation    Cervical / Trunk Assessment Cervical / Trunk Assessment: Back Surgery  Communication   Communication Communication: No apparent  difficulties Cueing Techniques: Verbal cues  Cognition Arousal: Alert Behavior During Therapy: WFL for tasks assessed/performed Overall Cognitive Status: Within Functional Limits for tasks assessed                                          General Comments General comments (skin integrity, edema, etc.): VSS    Exercises     Assessment/Plan    PT Assessment Patient does not need any further PT services  PT Problem List         PT Treatment Interventions      PT Goals (Current goals can be found in the Care Plan section)       Frequency       Co-evaluation               AM-PAC PT "6 Clicks" Mobility  Outcome Measure Help needed turning from your back to your side while in a flat bed without using bedrails?: None Help needed moving from lying on your back to sitting on the side of a flat bed without using bedrails?: None Help needed moving to and from a bed to a chair (including a wheelchair)?: None Help needed standing up from a chair using your arms (e.g., wheelchair or bedside chair)?: None Help needed to walk in hospital room?: None Help needed climbing 3-5 steps with a railing? : None 6 Click Score: 24    End of Session   Activity Tolerance: Patient tolerated treatment well Patient left: in bed;with call bell/phone within reach Nurse Communication: Mobility status PT Visit Diagnosis: Other abnormalities of gait and mobility (R26.89)    Time: 0865-7846 PT Time Calculation (min) (ACUTE ONLY): 19 min   Charges:   PT Evaluation $PT Eval Low Complexity: 1 Low   PT General Charges $$ ACUTE PT VISIT: 1 Visit         Arlyss Gandy, PT, DPT Acute Rehabilitation Office [phone]   Arlyss Gandy 11/19/2022, 10:21 AM

## 2022-11-19 NOTE — Progress Notes (Signed)
Patient alert and oriented, mae's well, voiding adequate amount of urine, swallowing without difficulty, no c/o pain at time of discharge. Patient discharged home with friends. Discharged instructions given to patient. Patient and friends stated understanding of instructions given. Patient has an appointment with Dr. Wynetta Emery.

## 2022-11-21 NOTE — Anesthesia Postprocedure Evaluation (Signed)
Anesthesia Post Note  Patient: Megan Savage  Procedure(s) Performed: Decompression Lumbar Laminecotomy and Fusion, Lumbar Four-Lumbar Five with Cages and Screws (Spine Lumbar)     Patient location during evaluation: PACU Anesthesia Type: General Level of consciousness: awake and alert Pain management: pain level controlled Vital Signs Assessment: post-procedure vital signs reviewed and stable Respiratory status: spontaneous breathing, nonlabored ventilation, respiratory function stable and patient connected to nasal cannula oxygen Cardiovascular status: blood pressure returned to baseline and stable Postop Assessment: no apparent nausea or vomiting Anesthetic complications: no   No notable events documented.                Maghan Jessee

## 2022-11-26 ENCOUNTER — Other Ambulatory Visit: Payer: Self-pay | Admitting: Cardiovascular Disease

## 2022-12-02 ENCOUNTER — Ambulatory Visit: Payer: Medicare Other | Admitting: Cardiovascular Disease

## 2022-12-30 ENCOUNTER — Other Ambulatory Visit: Payer: Self-pay | Admitting: Neurosurgery

## 2022-12-30 DIAGNOSIS — M4310 Spondylolisthesis, site unspecified: Secondary | ICD-10-CM

## 2023-01-01 ENCOUNTER — Ambulatory Visit
Admission: RE | Admit: 2023-01-01 | Discharge: 2023-01-01 | Disposition: A | Discharge status: Home / Self Care | Payer: Medicare Other | Source: Ambulatory Visit | Attending: Neurosurgery | Admitting: Neurosurgery

## 2023-01-01 DIAGNOSIS — M4310 Spondylolisthesis, site unspecified: Secondary | ICD-10-CM

## 2023-01-14 ENCOUNTER — Other Ambulatory Visit: Payer: Self-pay | Admitting: Obstetrics and Gynecology

## 2023-01-14 DIAGNOSIS — Z1231 Encounter for screening mammogram for malignant neoplasm of breast: Secondary | ICD-10-CM

## 2023-01-23 ENCOUNTER — Other Ambulatory Visit: Payer: Self-pay | Admitting: Cardiovascular Disease

## 2023-02-05 ENCOUNTER — Ambulatory Visit: Payer: Medicare Other | Admitting: Cardiovascular Disease

## 2023-02-17 ENCOUNTER — Other Ambulatory Visit: Payer: Self-pay | Admitting: Nurse Practitioner

## 2023-02-20 ENCOUNTER — Ambulatory Visit (INDEPENDENT_AMBULATORY_CARE_PROVIDER_SITE_OTHER): Payer: Medicare Other | Admitting: Podiatry

## 2023-02-20 ENCOUNTER — Encounter: Payer: Self-pay | Admitting: Podiatry

## 2023-02-20 DIAGNOSIS — L600 Ingrowing nail: Secondary | ICD-10-CM | POA: Diagnosis not present

## 2023-02-23 ENCOUNTER — Other Ambulatory Visit: Payer: Self-pay | Admitting: Cardiovascular Disease

## 2023-02-23 ENCOUNTER — Encounter: Payer: Self-pay | Admitting: Cardiovascular Disease

## 2023-02-23 NOTE — Progress Notes (Signed)
Subjective:   Patient ID: Megan Savage, female   DOB: 68 y.o.   MRN: [id number]   HPI Patient presents stating her right big toenail has been bothering her and she does not know if she may have injured it or what could have happened   ROS      Objective:  Physical Exam  Neurovascular status intact with inflammation pain of the right hallux nail more on the distal portion of the bed medial side     Assessment:  Probability for localized ingrown toenail deformity right hallux     Plan:  H&P reviewed went ahead today with sterile instrumentation debrided the nailbed taking the corner out and I advised this patient on soaks and if it gets worse we may need to correct the deformity

## 2023-02-24 MED ORDER — OLMESARTAN MEDOXOMIL 20 MG PO TABS: 20.0000 mg | ORAL_TABLET | Freq: Every day | ORAL | 0 refills | Status: DC

## 2023-02-24 MED ORDER — LEVOTHYROXINE SODIUM 88 MCG PO TABS: 88.0000 ug | ORAL_TABLET | Freq: Every day | ORAL | 0 refills | Status: DC

## 2023-02-24 MED ORDER — FENOFIBRIC ACID 135 MG PO CPDR: 135.0000 mg | DELAYED_RELEASE_CAPSULE | Freq: Every day | ORAL | 0 refills | Status: DC

## 2023-03-05 ENCOUNTER — Encounter: Payer: Self-pay | Admitting: Cardiovascular Disease

## 2023-03-05 ENCOUNTER — Ambulatory Visit: Payer: Medicare Other | Attending: Cardiovascular Disease | Admitting: Cardiovascular Disease

## 2023-03-05 VITALS — BP 122/70 | HR 75 | Ht 64.0 in | Wt 172.0 lb

## 2023-03-05 DIAGNOSIS — Z9889 Other specified postprocedural states: Secondary | ICD-10-CM | POA: Diagnosis present

## 2023-03-05 DIAGNOSIS — I10 Essential (primary) hypertension: Secondary | ICD-10-CM | POA: Insufficient documentation

## 2023-03-05 DIAGNOSIS — R7309 Other abnormal glucose: Secondary | ICD-10-CM | POA: Diagnosis present

## 2023-03-05 DIAGNOSIS — E782 Mixed hyperlipidemia: Secondary | ICD-10-CM | POA: Insufficient documentation

## 2023-03-05 DIAGNOSIS — E039 Hypothyroidism, unspecified: Secondary | ICD-10-CM | POA: Insufficient documentation

## 2023-03-05 MED ORDER — FEXOFENADINE HCL 180 MG PO TABS: 180.0000 mg | ORAL_TABLET | Freq: Every day | ORAL | 3 refills | Status: AC

## 2023-03-05 MED ORDER — LEVOTHYROXINE SODIUM 88 MCG PO TABS: 88.0000 ug | ORAL_TABLET | Freq: Every day | ORAL | 3 refills | Status: AC

## 2023-03-05 MED ORDER — OLMESARTAN MEDOXOMIL 20 MG PO TABS: 20.0000 mg | ORAL_TABLET | Freq: Every day | ORAL | 3 refills | Status: DC

## 2023-03-05 MED ORDER — SPIRONOLACTONE 25 MG PO TABS: 12.5000 mg | ORAL_TABLET | Freq: Every day | ORAL | 3 refills | Status: DC

## 2023-03-05 NOTE — Patient Instructions (Addendum)
 Medication Instructions:  No medication changes were made during today's visit.  Your requested refills have been sent to your pharmacy.  *If you need a refill on your cardiac medications before your next appointment, please call your pharmacy*   Lab Work: No labs were ordered during today's visit.   If you have labs (blood work) drawn today and your tests are completely normal, you will receive your results only by: MyChart Message (if you have MyChart) OR A paper copy in the mail If you have any lab test that is abnormal or we need to change your treatment, we will call you to review the results.   Testing/Procedures: No procedures ordered today.    Follow-Up: At [facility], you and your health needs are our priority.  As part of our continuing mission to provide you with exceptional heart care, we have created designated Provider Care Teams.  These Care Teams include your primary Cardiologist (physician) and Advanced Practice Providers (APPs -  Physician Assistants and Nurse Practitioners) who all work together to provide you with the care you need, when you need it.  We recommend signing up for the patient portal called MyChart.  Sign up information is provided on this After Visit Summary.  MyChart is used to connect with patients for Virtual Visits (Telemedicine).  Patients are able to view lab/test results, encounter notes, upcoming appointments, etc.  Non-urgent messages can be sent to your provider as well.   To learn more about what you can do with MyChart, go to [email or web address].    Your next appointment:   8 month(s)  Provider:   Shelda Bruckner, MD    Other Instructions Thank you for choosing [facility]!     A letter will be mailed to you as a reminder to call the office for your follow up appointment.

## 2023-03-05 NOTE — Progress Notes (Signed)
 Cardiology Office Note    Date:  03/06/2023   ID:  Megan Savage, DOB 1955/12/20, MRN [id number]  PCP:  Onita Rush, MD  Cardiologist:  Debby Sor, MD   16 month follow-up office visit  History of Present Illness:  Megan Savage is a 68 y.o. female who is followed by Dr. Rush Onita for primary care.  I saw her for initial evaluation in August 2019 when she was self-referred for difficult control hypertension and cardiovascular risk assessment.  She presents for a 16 month follow-up evaluation.  .  Megan Savage is well known to me.  She remotely had been seen by Dr. Tisa in 2012 and at that time had mild hypertension, hypohyroidism, obesity, and hyperlipidemia.  She was on valsartan 80 mg, levothyroxine  25 mcg, and omega-3 fatty acid.  She lost a significant amount of weight approximately 4 years ago and it and with a 36 pound weight loss her blood pressure improved and apparently she was taken off medication.  She had been started back on blood pressure medication in February 2019 and due to the valsartan recall was initially started on losartan.  She is now on 100 mg daily.  Bite this, she has noticed that her blood pressures have been consistently elevated.  She has regained some of her weight back.  On July 17, 2017 undergone a CT calcium  score was 0.  She was incidentally found to have a right lower lobe noncalcified pulmonary nodule.  She has subsequently been evaluated by Dr. Medora at [facility] with chief of thoracic surgery who she has served with on the cancer board at [facility].  She has a family history of heart issues and grandparents and her mother dying at 78 secondary to arrhythmia.  When I initially saw her she denied any chest pain, PND orthopnea.  She denied any awareness of sleep apnea but sleeps by herself and was unaware if she snores.   When I initially saw her, she was significantly hypertensive with blood pressure 170/88 without orthostatic change.  At that time, I  recommended discontinuing losartan and in its place substituted this for olmesartan  40 mg which should be more potent.  I also recommended the addition of Spironolactone  12.5 mg.  She underwent an echo Doppler study which demonstrated mild concentric LVH with normal EF at 60 to 65% without regional wall motion abnormalities.  There was grade 1 diastolic dysfunction and tissue Doppler was consistent with high ventricular filling pressures.  There was mild left atrial dilation.  Because of her somewhat accelerated hypertension I recommended a renal artery duplex evaluation.  This demonstrated mild to less than 59% bilateral narrowing of the right and left renal arteries with abnormal resistive index.  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  She denies any abdominal symptoms.  When I saw her I reviewed data with reference to the clinical atherosclerosis.  We discussed diet and her laboratory in May which had shown a total cholesterol of 206, HDL 34, LDL 118, and triglycerides of 271.   When I saw her in the office In October 2019 her blood pressure had improved on her increased medical regimen of olmesartan  40 mg and spironolactone  12.5 mg.  I recommended initiation of rosuvastatin  10 mg every other day for 2 weeks and if tolerated to try to increase this to 10 mg daily.    With the COVID-19 pandemic , she became more sedentary  and essentially stayed home.  Initially she was drinking more wine than normally.  I saw her for a telemedicine visit on May 24, 2018.  She could not tolerate the statin and had been started on Zetia  10 mg.  She underwent repeat laboratory prior to that telemedicine visit on May 12, 2018 and her total cholesterol increased to 227, triglycerides 914, HDL 27.  Remotely she recalled back in 2015 her triglycerides on 1 occasion had increased to 1305.  During that evaluation her blood pressure was elevated but she had received a hip injection  earlier that morning.  Subsequently, she enrolled in weight watchers.  She dramatically changed her diet and significantly reduced her wine consumption, not drinking at all during the week and only minimally on the weekends.   When I saw her on July 29, 2018 with the above changes to her regimen and with dietary adherence to weight watchers she had lost 22 pounds since May 24, 2018.  Her blood pressure has significantly improved and her dose of olmesartan  was reduced due to her calling and stating her blood pressure was getting low.  She underwent recent repeat laboratory which was dramatically improved with total cholesterol improving from 2 27-1 18, triglycerides from 914 to 83, HDL increasing from 27 to 42, and her LDL cholesterol improved to 59.  She feels great.    I saw her in December 2020 and over the previous 5 months she had continued with her diet .  She has been using weight watchers.  She has been walking at least 3 miles every day.  She no longer drinks wine during the week except on weekends.  She notes more energy.  She denies chest pain PND orthopnea.  She is unaware of palpitations.  She underwent right shoulder surgery replacement on September 17, 2018 and tolerated this well from a cardiovascular standpoint.  She did undergo physical therapy.  Repeat laboratory on December 28, 2018 showed a total cholesterol of 130, HDL 56, LDL 63, and triglycerides were now down to 54.  She feels great.  She is able to use some of her old wardrobe that she has not used in years and she has lost a total of 44 pounds since April.  Her weight at home today was 137 with a peak weight in April at 181.  Of note in 2015 she weighed 198 pounds.  She has had a recent telemedicine visit with Dr. Onita.  At that time, she was on a reduced dose of olmesartan  at 20 mg daily and continue to take spironolactone  12.5 mg.  On this regimen, her blood pressure remained optimal at 125/70.  I last saw her on May 08, 2020.  She  retired at the end of December 2021.  She had developed some eye issues and was found to have a total of 5 retinal tears and underwent laser treatment by Dr. Deboraha and at [facility] retinal specialist.  As result she has not been able to do any activity for a minimum of 4 weeks.  Recently, she has been going out to dinner more with friends.  She has noticed her weight has increased as well as her blood pressure.  She has been on olmesartan  at the reduced dose of only 10 mg and apparently 6 months ago she ran out of spironolactone .  She has had some issues with cough and had seen Dr. Belvie Silvan who recommended Claritin , and Flonase.  She denied chest pain and was unaware of palpitations.  During that evaluation, her blood pressure was elevated at 162/78 and I recommended further increasing olmesartan  back to 20 mg daily and resume spironolactone  12.5 mg.  She was evaluated by Megan Savage August 05, 2021 and was seen preoperatively prior to undergoing hip replacement.  I last saw her on November 04, 2021.  She underwent successful right hip surgery in August 27, 2021 via the anterior approach.  She tolerated the procedure well from a cardiovascular standpoint.  Following rehabilitation, she has done remarkably well.  She is now more active.  She continues to be on olmesartan  20 mg and spironolactone  12.5 mg daily for blood pressure control.  She is on Zetia  10 mg fenofibric acid  135 mg and omega-3 fatty acid 2 capsules twice a day for her mixed hyperlipidemia.  Dr. Onita had check laboratory in June 2023.  Chemistry and CBC were stable.  Apolipoprotein B was 83.  TSH was normal at 2.06.  Hemoglobin A1c was mildly increased at 6.1.  Lipid studies revealed total cholesterol 169 triglycerides 140 HDL 46 and LDL 95.  She previously had a calcium  score of 0.    Since I last saw her, she had undergone 4 surgeries in 2024.  She underwent left knee surgery in January 28, 2022.  2 weeks later after sustaining trauma to her  knee in mid February she required reexploration with Dr. Ernie.  September 2024, she developed significant back discomfort and was found to have a cyst compressing her spine and underwent neurosurgery with Dr. Arley cram for spondylolisthesis at L4 and L5.  On January 26, 2023 she underwent right knee meniscus repair surgery.  She tolerated all the surgical events without cardiovascular compromise.  She continues to see Dr. Norleen Onita for primary care.  She denies any chest pain or shortness of breath.  Laboratory in July 2024 showed total cholesterol 132 HDL 43 LDL 69 and triglycerides 101.  In October 2024 hemoglobin A1c had significantly increased to 7.2.  Presently, she continues to be on olmesartan  20 mg daily and spironolactone  12.5 mg daily for hypertension.  She is on Zetia  10 mg, fenofibric acid  135 mg, and over-the-counter fish oil  for mixed hyperlipidemia.  She takes Symbicort  for some mild asthma.  She continues to be on levothyroxine  88 mcg daily for hypothyroidism.  She presents for reevaluation.   Past Medical History:  Diagnosis Date   Anemia    Arthritis    Asthma    Very Mild   Bronchitis    Depression    denies, borther passed away   Fatigue    resolved    Heart murmur    Hypercholesterolemia    Hyperlipidemia    controlled    Hypertension    controlled   Hypothyroidism    Insomnia    Obesity    Skin disorder    rosacea    Past Surgical History:  Procedure Laterality Date   APPENDECTOMY  1976   BREAST BIOPSY Right 04/07/2022   MM RT BREAST BX W LOC DEV 1ST LESION IMAGE BX SPEC STEREO GUIDE 04/07/2022 [facility]   BREAST IMPLANT REMOVAL Bilateral    implanted 1996 removed 2002   CARPAL TUNNEL RELEASE     bilateral    ENDOMETRIAL FULGURATION  1992   foot surgery      tumor removed (left)    HYSTEROSCOPY WITH D & C N/A 06/15/2018   Procedure: DILATATION AND CURETTAGE /HYSTEROSCOPY;  Surgeon: Mat Browning, MD;  Location: [facility];  Service: Gynecology;  Laterality: N/A;   hysterscopy      I & D EXTREMITY Left 03/09/2022   Procedure: IRRIGATION AND DEBRIDEMENT LEFT KNEE WITH PRIMARY CLOSURE;  Surgeon: Ernie Cough, MD;  Location: [facility];  Service: Orthopedics;  Laterality: Left;   OOPHORECTOMY  1976   left ovary removed    TOTAL HIP ARTHROPLASTY Right 08/27/2021   Procedure: TOTAL HIP ARTHROPLASTY ANTERIOR APPROACH;  Surgeon: Ernie Cough, MD;  Location: [facility];  Service: Orthopedics;  Laterality: Right;   TOTAL KNEE ARTHROPLASTY Left 02/18/2022   Procedure: TOTAL KNEE ARTHROPLASTY;  Surgeon: Ernie Cough, MD;  Location: [facility];  Service: Orthopedics;  Laterality: Left;   TOTAL SHOULDER ARTHROPLASTY Right 09/17/2018   Procedure: TOTAL SHOULDER ARTHROPLASTY anatomical;  Surgeon: Kay Kemps, MD;  Location: [facility];  Service: Orthopedics;  Laterality: Right;    Current Medications: Outpatient Medications Prior to Visit  Medication Sig Dispense Refill   budesonide -formoterol  (SYMBICORT ) 80-4.5 MCG/ACT inhaler Inhale 2 puffs into the lungs 2 (two) times daily.     acetaminophen  (TYLENOL ) 500 MG tablet Take 500 mg by mouth every 6 (six) hours as needed for mild pain (pain score 1-3). (Patient not taking: Reported on 03/05/2023)     Ascorbic Acid  (VITAMIN C ) 500 MG CAPS Take 500 mg by mouth daily. (Patient not taking: Reported on 02/20/2023)     CALCIUM  CITRATE-VITAMIN D  PO Take 2 tablets by mouth daily with breakfast.  500 mg / 400 Units (Patient not taking: Reported on 02/20/2023)     celecoxib  (CELEBREX ) 200 MG capsule Take 200 mg by mouth 2 (two) times daily as needed for moderate pain (pain score 4-6).     Cholecalciferol  (VITAMIN D3) 50 MCG (2000 UT) TABS Take 2,000 Units by mouth daily. (Patient not taking: Reported on 02/20/2023)     Choline Fenofibrate  (FENOFIBRIC ACID ) 135 MG CPDR Take 135 mg by mouth daily. 30 capsule 0   estradiol (VIVELLE-DOT) 0.025 MG/24HR Place 1 patch onto the skin See admin instructions.  Place 1  patch onto the skin on Tuesdays and Fridays- after removing the old one     ezetimibe  (ZETIA ) 10 MG tablet Take 1 tablet (10 mg total) by mouth daily. PLEASE KEEP UPCOMING APPOINTMENT IN ORDER TO RECEIVE FUTURE REFILLS 90 tablet 3   multivitamin (THERAGRAN) per tablet Take 1 tablet by mouth daily with breakfast. (Patient not taking: Reported on 02/20/2023)     Omega-3 Fatty Acids (FISH OIL  PO) Take 1,400 mg by mouth daily. (Patient not taking: Reported on 02/20/2023)     oxybutynin  (DITROPAN -XL) 10 MG 24 hr tablet Take 10 mg by mouth at bedtime.     progesterone  (PROMETRIUM ) 100 MG capsule Take 100 mg by mouth See admin instructions. Take 100 mg by mouth on days 1-10 of every month     TURMERIC CURCUMIN PO Take 500 mg by mouth daily. (Patient not taking: Reported on 02/20/2023)     Vitamin D , Ergocalciferol , (DRISDOL ) 50000 units CAPS capsule Take 50,000 Units by mouth every 30 (thirty) days.  (Patient not taking: Reported on 02/20/2023)     zinc  gluconate 50 MG tablet Take 50 mg by mouth daily. (Patient not taking: Reported on 02/20/2023)     fexofenadine  (ALLEGRA ) 180 MG tablet Take 180 mg by mouth daily. (Patient not taking: Reported on 02/20/2023)     levothyroxine  (SYNTHROID ) 88 MCG tablet Take 1 tablet (88 mcg total) by mouth daily before breakfast. 30 tablet 0   olmesartan  (BENICAR ) 20 MG tablet Take 1  tablet (20 mg total) by mouth daily. 30 tablet 0   spironolactone  (ALDACTONE ) 25 MG tablet Take 0.5 tablets (12.5 mg total) by mouth daily. 45 tablet 3   No facility-administered medications prior to visit.     Allergies:   Cat dander   Social History   Socioeconomic History   Marital status: Divorced    Spouse name: Not on file   Number of children: Not on file   Years of education: Not on file   Highest education level: Not on file  Occupational History   Occupation: [employer]ef[employer] Teacher: SELF-EMPLOYED  Tobacco Use   Smoking status: Never   Smokeless tobacco: Never   Vaping Use   Vaping status: Never Used  Substance and Sexual Activity   Alcohol  use: Yes    Comment: 2-3 glasses wine daily , 1 drink each weekend    Drug use: Never   Sexual activity: Not Currently    Birth control/protection: Post-menopausal  Other Topics Concern   Not on file  Social History Narrative   Not on file   Social Drivers of Health   Financial Resource Strain: Not on file  Food Insecurity: No Food Insecurity (03/09/2022)   Hunger Vital Sign    Worried About Running Out of Food in the Last Year: Never true    Ran Out of Food in the Last Year: Never true  Transportation Needs: No Transportation Needs (03/09/2022)   PRAPARE - Administrator, Civil Service (Medical): No    Lack of Transportation (Non-Medical): No  Physical Activity: Not on file  Stress: Not on file  Social Connections: Not on file    Additional social history is that she has been very active in the community.  She serves on the cancer board at [facility].  She had worked in [employer] for 12 years from [phone].  She has worked in chief financial officer.  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  Family History:  The patient's family history includes Arrhythmia in her mother; Asthma in her brother, mother, and paternal aunt; Cancer in her brother and father; Emphysema in her mother.   Her father died at age 44 and was a physician.  He had laryngeal CA.  Mother died at 73 secondary to arrhythmias.  Her brother died at age 67 secondary to ALL and T-cell lymphoma.  ROS General: Negative; No fevers, chills, or night sweats;  HEENT: Negative; No changes in vision or hearing, sinus congestion, difficulty swallowing Pulmonary: Recent cough, resolved Cardiovascular: Negative; No chest pain, presyncope, syncope, palpitations GI: Negative; No  nausea, vomiting, diarrhea, or abdominal pain GU: Negative; No dysuria, hematuria, or difficulty voiding Musculoskeletal: Status post right shoulders replacement September 17, 2018; right hip surgery August 27, 2021, left knee surgery January 2024 with repeat surgery following trauma to her knee 3 weeks later.  Compression L for L5 requiring surgery September 2024, and most recent right knee surgery with meniscus repair December 2024 Hematologic/Oncology: Negative; no easy bruising, bleeding Endocrine: Negative; no heat/cold intolerance; no diabetes Neuro: Negative; no changes in balance, headaches Skin: Negative; No rashes or skin lesions Psychiatric: Negative; No behavioral problems, depression Sleep: Negative; No snoring, daytime sleepiness, hypersomnolence, bruxism, restless legs, hypnogognic hallucinations, no cataplexy Other comprehensive 14 point system review is negative.   PHYSICAL EXAM:   VS:  BP 122/70   Pulse 75   Ht 5' 4 (1.626 m)   Wt 172 lb (78 kg)   SpO2 98%   BMI 29.52 kg/m     Blood pressure by me 130/74  Wt Readings from Last 3 Encounters:  03/05/23 172 lb (78 kg)  11/18/22 160 lb (72.6 kg)  11/14/22 165 lb 1.6 oz (74.9 kg)    General: Alert, oriented, no distress.  Skin: normal turgor, no rashes, warm and dry HEENT: Normocephalic, atraumatic. Pupils equal round and reactive to light; sclera anicteric; extraocular muscles intact; F Nose without nasal septal hypertrophy Mouth/Parynx benign; Mallinpatti scale 3 Neck: No JVD, no carotid bruits; normal carotid upstroke Lungs: clear to ausculatation and percussion; no wheezing or rales Chest wall: without tenderness to palpitation Heart: PMI not displaced, RRR, s1 s2 normal, 1/6 systolic murmur, no diastolic murmur, no rubs, gallops, thrills, or heaves Abdomen: soft, nontender; no hepatosplenomehaly, BS+; abdominal aorta nontender and not dilated by palpation. Back: no CVA tenderness Pulses 2+ Musculoskeletal: full  range of motion, normal strength, no joint deformities Extremities: no clubbing cyanosis or edema, Homan's sign negative  Neurologic: grossly nonfocal; Cranial nerves grossly wnl Psychologic: Normal mood and affect     Studies/Labs Reviewed:   EKG Interpretation Date/Time:  Thursday March 05 2023 08:19:04 EST Ventricular Rate:  75 PR Interval:  194 QRS Duration:  82 QT Interval:  386 QTC Calculation: 431 R Axis:   -36  Text Interpretation: Normal sinus rhythm Left axis deviation Anterior infarct , age undetermined When compared with ECG of 14-Nov-2022 10:29, No significant change was found Confirmed by Burnard Ned ([id number]) on 03/06/2023 4:32:11 PM    October, 9, 2023 ECG (independently read by me): NSR at 73, PRWP, no ectopy, normal interval  May 08, 2020 ECG (independently read by me): NSR at 73, LAHB  January 18, 2019 ECG (independently read by me): Normal sinus rhythm at 78 bpm, left axis deviation.  Poor anterior R wave progression.  Normal intervals.  No ectopy.  July 2020 EKG:  EKG is  ordered today.  ECG (independently read by me): Normal sinus rhythm at 74 bpm.  Borderline first-degree AV block with minimal to 202 ms.  QTc interval normal at 437 ms.  October 2019 ECG (independently read by me): Sinus rhythm 84 bpm.  Borderline first-degree AV block with a PR interval 206 ms.  Mild left axis deviation.  No ectopy.  August 31, 2017 ECG (independently read by me): Sinus rhythm at 76 bpm.  Right superior axis.  Normal intervals.  No ectopy.  No ST segment changes.  Recent Labs:    Latest Ref Rng & Units 11/14/2022   10:17 AM 03/10/2022    3:14 AM 02/19/2022    3:34 AM  BMP  Glucose 70 - 99 mg/dL 743  743  802   BUN 8 - 23 mg/dL 17  20  16    Creatinine 0.44 - 1.00 mg/dL 9.28  9.25  9.29   Sodium 135 - 145 mmol/L 136  135  136   Potassium 3.5 - 5.1 mmol/L 4.0  4.4  4.3   Chloride 98 - 111 mmol/L 103  103  103   CO2 22 - 32 mmol/L 23  23  24    Calcium  8.9 - 10.3 mg/dL  9.7  8.5  8.6         Latest Ref Rng & Units 11/14/2022   10:17 AM 05/16/2020    7:22 AM 07/22/2018    9:00 AM  Hepatic Function  Total Protein 6.5 - 8.1 g/dL 6.7  7.1  6.4   Albumin  3.5 - 5.0 g/dL 4.0  4.5  4.7   AST 15 - 41 U/L 19  18  17    ALT 0 - 44 U/L 29  24  19    Alk Phosphatase 38 - 126 U/L 48  86  38   Total Bilirubin 0.3 - 1.2 mg/dL 1.0  0.3  0.5        Latest Ref Rng & Units 11/14/2022   10:17 AM 03/10/2022    3:14 AM 02/19/2022    3:34 AM  CBC  WBC 4.0 - 10.5 K/uL 6.1  7.8  11.2   Hemoglobin 12.0 - 15.0 g/dL 86.5  8.8  89.2   Hematocrit 36.0 - 46.0 % 39.6  27.6  32.3   Platelets 150 - 400 K/uL 279  308  260    Lab Results  Component Value Date   MCV 88.6 11/14/2022   MCV 94.2 03/10/2022   MCV 91.2 02/19/2022   Lab Results  Component Value Date   TSH 6.210 (H) 05/16/2020   Lab Results  Component Value Date   HGBA1C 7.2 (H) 11/14/2022     BNP No results found for: BNP  ProBNP No results found for: PROBNP   Lipid Panel     Component Value Date/Time   CHOL 162 05/16/2020 0722   TRIG 242 (H) 05/16/2020 0722   HDL 41 05/16/2020 0722   CHOLHDL 4.0 05/16/2020 0722   CHOLHDL 4 06/21/2010 0811   VLDL 36.4 06/21/2010 0811   LDLCALC 81 05/16/2020 0722     RADIOLOGY: No results found.    Additional studies/ records that were reviewed today include:  I reviewed her remote record from Dr. Tisa in 2012.  I also reviewed her initial consultation at [facility] on July 28, 2017 by Dr. CHARM Hoe.  Reviewed her CT calcium  score; echo Doppler study, renal duplex scan.  Recent laboratory from December 28, 2018 was reviewed.  Total cholesterol 130, HDL 56, LDL 63, triglycerides 54.  Hemoglobin A1c on July 27, 2018 was 5.1.  Serum creatinine 0.6 on December 28, 2018.  TSH 1.72.  ASSESSMENT:    1. Primary hypertension   2. Mixed hyperlipidemia   3. Hypothyroidism (acquired)   4. Elevated hemoglobin A1c   5. S/P musculoskeletal system surgery     PLAN:    1.  Essential hypertension: She has a history of hypertension dating back to at least 2012 at which time medical therapy was initiated.  She had stopped medication for several years after she had lost significant weight.  I had reinstituted medication and in April 2020  telemedicine visit blood pressure was still increased on olmesartan  40 mg and spironolactone  12.5 mg daily.  Since that evaluation with her significant weight loss, reduction in alcohol  intake as well as increased activity her blood pressure when last evaluated in December 2020, blood pressure was excellent on a reduced dose of olmesartan  10 mg and spironolactone  12.5 mg.  When last evaluated by me April 2022 blood pressure was elevated and she was advised to further increase to 20 mg daily and continue with the spironolactone  12.5 mg.  Blood Pressure today is stable and on repeat by me 130/74 on losartan 20 mg and spironolactone  12.5 mg daily.   2.  Mixed hyperlipidemia: Historically she had an episode of marked hypertriglyceridemia in 2015 with levels at 1305.  With the initiation of the COVID pandemic and  stay at home dietary compliance was significantly less and there was consumption of increased wine compared to previously.  This resulted in significant increase in triglycerides to 914.  When I saw her in December 2020, since May 24, 2018 she had lost 44 pounds and was on a diet her diet with  fish 4 times per week, absence of wine during the week, regular exercise, and avoidance of sugars and sweets as well as doing weight watchers.  Subsequent laboratory showed marked benefit and lipids were excellent with triglyceride at 54, LDL 63, HDL increasing to 56 and LDL at 130.  She has been on therapy with fenofibrate  145 mg, Vascepa  2 capsules twice a day in addition to Zetia  10 mg daily.  Laboratory done by Dr. Onita in June 2023 showed total cholesterol 169, triglycerides 140, HDL 46, LDL 95.  Apolipoprotein B was 83.  She is no longer on  Vascepa  since this was not covered by insurance but she continues to take omega-3 fatty acid.  Calcium  score 0 in June 2019.  Most recent lipid panel in July 2024 showed total cholesterol 132, HDL 43, LDL 69, triglycerides 101.  3.  Musculoskeletal surgery: Status post right hip surgery August 27, 2021.  This was done via the anterior approach.  She had a torn labrum..  In 2024 she underwent successful left knee surgery in January 2024 but due to trauma to her knee 3 weeks later required reoperation with wound cleansing by Dr. Ernie.  She developed a cyst on her spine with compression of her nerves requiring surgery of L4-L5 by Dr. Arley cram in September 2024 and most recently on January 26, 2023 underwent right knee meniscus surgery.  4.  Retinal detachment.  She was found to have retinal detachment when seen by Dr. Florene in place of Dr. Rosan and was referred to Dr.Govind of [facility] retinal specialist and received laser treatment for 5 retinal tears.    5.  Weight fluctuation: She had weight fluctuation over the years.  She had purposeful weight loss totaling 44 pounds from April 2020 until December 2020 when her weight decreased to a low of 137 pounds from a peak weight of 181.  When seen in October 2023 weight was stable at 173 with BMI 29.24.  Current weight remains stable now at 172 pounds BMI 29.52.    6. Hypothyroidism: Most recent TSH was 3.91 in over 2024, currently on levothyroxine  88 mcg.  7.  Hemoglobin A1c previously was 6.1 consistent with prediabetes.  Moderate, over the past year hemoglobin A1c had risen to 7.2 in October 2024.  He has now started to lose additional weight and has had significant dietary adjustment.  She is followed by Dr. Onita.  8.  Pulmonary nodule: Followed by Dr. Medora at [facility].  This has been stable.  9.  Status post right shoulder replacement September 17, 2018; completed physical therapy.    I discussed with her my plans for retirement at the end of  June 2025.  I will transition her to the care of Dr. Dawna Bruckner to be evaluated in our [facility]bridge office and we will tentatively schedule a follow-up appointment in October 2025.  Medication Adjustments/Labs and Tests Ordered: Current medicines are reviewed at length with the patient today.  Concerns regarding medicines are outlined above.  Medication changes, Labs and Tests ordered today are listed in the Patient Instructions below. Patient Instructions  Medication Instructions:  No medication changes were made during today's visit.  Your requested  refills have been sent to your pharmacy.  *If you need a refill on your cardiac medications before your next appointment, please call your pharmacy*   Lab Work: No labs were ordered during today's visit.   If you have labs (blood work) drawn today and your tests are completely normal, you will receive your results only by: MyChart Message (if you have MyChart) OR A paper copy in the mail If you have any lab test that is abnormal or we need to change your treatment, we will call you to review the results.   Testing/Procedures: No procedures ordered today.    Follow-Up: At [facility], you and your health needs are our priority.  As part of our continuing mission to provide you with exceptional heart care, we have created designated Provider Care Teams.  These Care Teams include your primary Cardiologist (physician) and Advanced Practice Providers (APPs -  Physician Assistants and Nurse Practitioners) who all work together to provide you with the care you need, when you need it.  We recommend signing up for the patient portal called MyChart.  Sign up information is provided on this After Visit Summary.  MyChart is used to connect with patients for Virtual Visits (Telemedicine).  Patients are able to view lab/test results, encounter notes, upcoming appointments, etc.  Non-urgent messages can be sent to your provider as well.    To learn more about what you can do with MyChart, go to [email or web address].    Your next appointment:   8 month(s)  Provider:   Shelda Bruckner, MD    Other Instructions Thank you for choosing [facility]!     A letter will be mailed to you as a reminder to call the office for your follow up appointment.     Signed, Debby Sor, MD  03/06/2023 4:48 PM    [facility] [address], [address], [place], [place]  [address] Phone: [phone]  At [facility] retinal specialist who found 5 retinal tears and she underwent laser treatment.  She has been an active and unable to exercise for the past 4 weeks resulting in

## 2023-03-06 ENCOUNTER — Encounter: Payer: Self-pay | Admitting: Cardiovascular Disease

## 2023-03-06 MED ORDER — SPIRONOLACTONE 25 MG PO TABS: 12.5000 mg | ORAL_TABLET | Freq: Every day | ORAL | 3 refills | Status: DC

## 2023-03-16 ENCOUNTER — Ambulatory Visit
Admission: RE | Admit: 2023-03-16 | Discharge: 2023-03-16 | Disposition: A | Discharge status: Home / Self Care | Payer: Medicare Other | Source: Ambulatory Visit | Attending: Obstetrics and Gynecology | Admitting: Obstetrics and Gynecology

## 2023-03-16 ENCOUNTER — Other Ambulatory Visit: Payer: Self-pay | Admitting: Obstetrics and Gynecology

## 2023-03-16 DIAGNOSIS — N6320 Unspecified lump in the left breast, unspecified quadrant: Secondary | ICD-10-CM

## 2023-03-16 DIAGNOSIS — Z1231 Encounter for screening mammogram for malignant neoplasm of breast: Secondary | ICD-10-CM

## 2023-03-16 DIAGNOSIS — N644 Mastodynia: Secondary | ICD-10-CM

## 2023-03-23 ENCOUNTER — Other Ambulatory Visit: Payer: Self-pay | Admitting: Cardiovascular Disease

## 2023-03-27 ENCOUNTER — Other Ambulatory Visit: Payer: Self-pay | Admitting: Obstetrics and Gynecology

## 2023-03-27 ENCOUNTER — Ambulatory Visit
Admission: RE | Admit: 2023-03-27 | Discharge: 2023-03-27 | Disposition: A | Discharge status: Home / Self Care | Payer: Medicare Other | Source: Ambulatory Visit | Attending: Obstetrics and Gynecology | Admitting: Obstetrics and Gynecology

## 2023-03-27 ENCOUNTER — Other Ambulatory Visit: Payer: Medicare Other

## 2023-03-27 DIAGNOSIS — N6320 Unspecified lump in the left breast, unspecified quadrant: Secondary | ICD-10-CM

## 2023-03-27 DIAGNOSIS — N644 Mastodynia: Secondary | ICD-10-CM

## 2023-04-06 ENCOUNTER — Ambulatory Visit: Payer: Medicare Other | Admitting: Pulmonary Disease

## 2023-04-22 ENCOUNTER — Other Ambulatory Visit: Payer: Self-pay | Admitting: Cardiovascular Disease

## 2023-06-16 ENCOUNTER — Encounter: Payer: Self-pay | Admitting: Cardiovascular Disease

## 2023-10-15 LAB — HEMOGLOBIN A1C: A1c: 7

## 2023-10-16 LAB — LAB REPORT - SCANNED: EGFR: 99.1

## 2023-10-21 ENCOUNTER — Ambulatory Visit: Admitting: Pulmonary Disease

## 2023-10-21 ENCOUNTER — Encounter: Payer: Self-pay | Admitting: Pulmonary Disease

## 2023-10-21 VITALS — BP 133/78 | HR 94 | Ht 64.5 in | Wt 164.0 lb

## 2023-10-21 DIAGNOSIS — J4520 Mild intermittent asthma, uncomplicated: Secondary | ICD-10-CM | POA: Diagnosis not present

## 2023-10-21 MED ORDER — BUDESONIDE-FORMOTEROL FUMARATE 80-4.5 MCG/ACT IN AERO: 2.0000 | INHALATION_SPRAY | Freq: Two times a day (BID) | RESPIRATORY_TRACT | 6 refills | Status: AC

## 2023-10-21 NOTE — Patient Instructions (Addendum)
 Continue Symbicort  2 puffs twice daily as needed - rinse mouth out after each use  Use Symbicort  inhaler schedule 3-4 days before an outdoor outing  Follow up in 1 year, call sooner if needed

## 2023-10-21 NOTE — Progress Notes (Signed)
 Subjective:   PATIENT ID: Megan Savage GENDER: female DOB: 1955/11/01, MRN: [id number]   HPI Discussed the use of AI scribe software for clinical note transcription with the patient, who gave verbal consent to proceed.  History of Present Illness   Megan Savage is a 68 year old female with asthma who presents for follow-up.  Asthma symptoms are generally mild, with episodes of coughing and wheezing occurring twice a year, typically in spring and fall. A severe flare-up in June 2025 followed a golf outing, requiring treatment with her inhaler nebulizer treatments and prendisone. Symptoms often follow outdoor activities like golf.  She uses a Symbicort  inhaler as needed, especially during flare-ups, and has been prescribed prednisone  for severe episodes.  There is no heartburn or sinus congestion with post nasal drainage. Her mother had COPD and passed away suddenly from concern of arrhythmia due to albuterol , which makes her cautious about using inhalers.     Regional allergy  panel positive IgE 44. RAST panel positive for dust mites, cat dander, dog dander, cockroach.   Past Medical History:  Diagnosis Date   Anemia    Arthritis    Asthma    Very Mild   Bronchitis    Depression    denies, borther passed away   Fatigue    resolved    Heart murmur    Hypercholesterolemia    Hyperlipidemia    controlled    Hypertension    controlled   Hypothyroidism    Insomnia    Obesity    Skin disorder    rosacea     Family History  Problem Relation Age of Onset   Arrhythmia Mother    Emphysema Mother    Asthma Mother    Cancer Father    Asthma Paternal Aunt    Cancer Brother        leumkemia   Asthma Brother    Breast cancer Neg Hx      Social History   Socioeconomic History   Marital status: Divorced    Spouse name: Not on file   Number of children: Not on file   Years of education: Not on file   Highest education level: Not on file  Occupational History    Occupation: TEFL teacher: SELF-EMPLOYED  Tobacco Use   Smoking status: Never   Smokeless tobacco: Never  Vaping Use   Vaping status: Never Used  Substance and Sexual Activity   Alcohol  use: Yes    Comment: 2-3 glasses wine daily , 1 drink each weekend    Drug use: Never   Sexual activity: Not Currently    Birth control/protection: Post-menopausal  Other Topics Concern   Not on file  Social History Narrative   Not on file   Social Drivers of Health   Financial Resource Strain: Not on file  Food Insecurity: No Food Insecurity (07/23/2023)   Received from [facility]   Hunger Vital Sign    Within the past 12 months, you worried that your food would run out before you got the money to buy more.: Never true    Within the past 12 months, the food you bought just didn't last and you didn't have money to get more.: Never true  Transportation Needs: No Transportation Needs (07/23/2023)   Received from [facility] - Transportation    In the past 12 months, has lack of transportation kept you from medical appointments or from  getting medications?: No    Lack of Transportation (Non-Medical): No  Physical Activity: Not on file  Stress: Not on file  Social Connections: Not on file  Intimate Partner Violence: Not At Risk (03/09/2022)   Humiliation, Afraid, Rape, and Kick questionnaire    Fear of Current or Ex-Partner: No    Emotionally Abused: No    Physically Abused: No    Sexually Abused: No     Allergies  Allergen Reactions   Cat Dander Other (See Comments)    Watery eyes     Outpatient Medications Prior to Visit  Medication Sig Dispense Refill   acetaminophen  (TYLENOL ) 500 MG tablet Take 500 mg by mouth every 6 (six) hours as needed for mild pain (pain score 1-3).     Ascorbic Acid  (VITAMIN C ) 500 MG CAPS Take 500 mg by mouth daily.     CALCIUM  CITRATE-VITAMIN D  PO Take 2 tablets by mouth daily with breakfast.   500 mg / 400 Units     Cholecalciferol  (VITAMIN D3) 50 MCG (2000 UT) TABS Take 2,000 Units by mouth daily.     Choline Fenofibrate  (FENOFIBRIC ACID ) 135 MG CPDR Take 1 capsule by mouth daily. 90 capsule 3   estradiol (VIVELLE-DOT) 0.025 MG/24HR Place 1 patch onto the skin See admin instructions.  Place 1 patch onto the skin on Tuesdays and Fridays- after removing the old one     ezetimibe  (ZETIA ) 10 MG tablet Take 1 tablet (10 mg total) by mouth daily. PLEASE KEEP UPCOMING APPOINTMENT IN ORDER TO RECEIVE FUTURE REFILLS 90 tablet 3   fexofenadine  (ALLEGRA ) 180 MG tablet Take 1 tablet (180 mg total) by mouth daily. 30 tablet 3   levothyroxine  (SYNTHROID ) 88 MCG tablet Take 1 tablet (88 mcg total) by mouth daily before breakfast. 90 tablet 3   multivitamin (THERAGRAN) per tablet Take 1 tablet by mouth daily with breakfast.     olmesartan  (BENICAR ) 20 MG tablet Take 1 tablet (20 mg total) by mouth daily. 90 tablet 3   Omega-3 Fatty Acids (FISH OIL  PO) Take 1,400 mg by mouth daily.     oxybutynin  (DITROPAN -XL) 10 MG 24 hr tablet Take 10 mg by mouth at bedtime.     progesterone  (PROMETRIUM ) 100 MG capsule Take 100 mg by mouth See admin instructions. Take 100 mg by mouth on days 1-10 of every month     spironolactone  (ALDACTONE ) 25 MG tablet Take 0.5 tablets (12.5 mg total) by mouth daily. 45 tablet 3   TURMERIC CURCUMIN PO Take 500 mg by mouth daily.     Vitamin D , Ergocalciferol , (DRISDOL ) 50000 units CAPS capsule Take 50,000 Units by mouth every 30 (thirty) days.      zinc  gluconate 50 MG tablet Take 50 mg by mouth daily.     budesonide -formoterol  (SYMBICORT ) 80-4.5 MCG/ACT inhaler Inhale 2 puffs into the lungs 2 (two) times daily.     celecoxib  (CELEBREX ) 200 MG capsule Take 200 mg by mouth 2 (two) times daily as needed for moderate pain (pain score 4-6).     No facility-administered medications prior to visit.    Review of Systems  Constitutional:  Negative for chills, fever, malaise/fatigue and  weight loss.  HENT:  Negative for congestion, sinus pain and sore throat.   Eyes: Negative.   Respiratory:  Negative for cough, hemoptysis, sputum production, shortness of breath and wheezing.   Cardiovascular:  Negative for chest pain, palpitations, orthopnea, claudication and leg swelling.  Gastrointestinal:  Negative for abdominal pain, heartburn, nausea and  vomiting.  Genitourinary: Negative.   Musculoskeletal:  Negative for joint pain and myalgias.  Skin:  Negative for rash.  Neurological:  Negative for weakness.  Endo/Heme/Allergies:  Positive for environmental allergies.  Psychiatric/Behavioral: Negative.        Objective:   Vitals:   10/21/23 1432  BP: 133/78  Pulse: 94  SpO2: 96%  Weight: 164 lb (74.4 kg)  Height: 5' 4.5 (1.638 m)   Physical Exam Constitutional:      General: She is not in acute distress.    Appearance: Normal appearance.  Eyes:     General: No scleral icterus.    Conjunctiva/sclera: Conjunctivae normal.  Cardiovascular:     Rate and Rhythm: Normal rate and regular rhythm.  Pulmonary:     Breath sounds: No wheezing, rhonchi or rales.  Musculoskeletal:     Right lower leg: No edema.     Left lower leg: No edema.  Skin:    General: Skin is warm and dry.  Neurological:     General: No focal deficit present.    CBC    Component Value Date/Time   WBC 6.1 11/14/2022 1017   RBC 4.47 11/14/2022 1017   HGB 13.4 11/14/2022 1017   HGB 13.3 05/16/2020 0722   HCT 39.6 11/14/2022 1017   HCT 40.0 05/16/2020 0722   PLT 279 11/14/2022 1017   PLT 272 05/16/2020 0722   MCV 88.6 11/14/2022 1017   MCV 89 05/16/2020 0722   MCH 30.0 11/14/2022 1017   MCHC 33.8 11/14/2022 1017   RDW 12.5 11/14/2022 1017   RDW 12.2 05/16/2020 0722   LYMPHSABS 1.5 06/21/2010 0811   MONOABS 0.3 06/21/2010 0811   EOSABS 0.1 06/21/2010 0811   BASOSABS 0.0 06/21/2010 0811     Chest imaging:  PFT:     No data to display           Labs:  Path:  Echo:  Heart Catheterization:       Assessment & Plan:   Mild intermittent asthma without complication - Plan: budesonide -formoterol  (SYMBICORT ) 80-4.5 MCG/ACT inhaler Assessment and Plan    Mild intermittent asthma Symptoms triggered by outdoor activities, managed with Symbicort . Rare nocturnal symptoms, no significant GERD or sinus issues.  - Symbicort  inhaler 80-4.60mcg 2 puffs twice daily for as-needed and rescue use. - Advise starting Symbicort  before and after outdoor activities to prevent flares. - Instruct to contact office for prednisone  if severe flares occur. - Schedule follow-up in one year, earlier if breathing issues arise.   Dorn Chill, MD [facility] Office: [phone]   Current Outpatient Medications:    acetaminophen  (TYLENOL ) 500 MG tablet, Take 500 mg by mouth every 6 (six) hours as needed for mild pain (pain score 1-3)., Disp: , Rfl:    Ascorbic Acid  (VITAMIN C ) 500 MG CAPS, Take 500 mg by mouth daily., Disp: , Rfl:    CALCIUM  CITRATE-VITAMIN D  PO, Take 2 tablets by mouth daily with breakfast.  500 mg / 400 Units, Disp: , Rfl:    Cholecalciferol  (VITAMIN D3) 50 MCG (2000 UT) TABS, Take 2,000 Units by mouth daily., Disp: , Rfl:    Choline Fenofibrate  (FENOFIBRIC ACID ) 135 MG CPDR, Take 1 capsule by mouth daily., Disp: 90 capsule, Rfl: 3   estradiol (VIVELLE-DOT) 0.025 MG/24HR, Place 1 patch onto the skin See admin instructions.  Place 1 patch onto the skin on Tuesdays and Fridays- after removing the old one, Disp: , Rfl:    ezetimibe  (ZETIA ) 10 MG tablet,  Take 1 tablet (10 mg total) by mouth daily. PLEASE KEEP UPCOMING APPOINTMENT IN ORDER TO RECEIVE FUTURE REFILLS, Disp: 90 tablet, Rfl: 3   fexofenadine  (ALLEGRA ) 180 MG tablet, Take 1 tablet (180 mg total) by mouth daily., Disp: 30 tablet, Rfl: 3   levothyroxine  (SYNTHROID ) 88 MCG tablet, Take 1 tablet (88 mcg total) by mouth daily before breakfast., Disp: 90  tablet, Rfl: 3   multivitamin (THERAGRAN) per tablet, Take 1 tablet by mouth daily with breakfast., Disp: , Rfl:    olmesartan  (BENICAR ) 20 MG tablet, Take 1 tablet (20 mg total) by mouth daily., Disp: 90 tablet, Rfl: 3   Omega-3 Fatty Acids (FISH OIL  PO), Take 1,400 mg by mouth daily., Disp: , Rfl:    oxybutynin  (DITROPAN -XL) 10 MG 24 hr tablet, Take 10 mg by mouth at bedtime., Disp: , Rfl:    progesterone  (PROMETRIUM ) 100 MG capsule, Take 100 mg by mouth See admin instructions. Take 100 mg by mouth on days 1-10 of every month, Disp: , Rfl:    spironolactone  (ALDACTONE ) 25 MG tablet, Take 0.5 tablets (12.5 mg total) by mouth daily., Disp: 45 tablet, Rfl: 3   TURMERIC CURCUMIN PO, Take 500 mg by mouth daily., Disp: , Rfl:    Vitamin D , Ergocalciferol , (DRISDOL ) 50000 units CAPS capsule, Take 50,000 Units by mouth every 30 (thirty) days. , Disp: , Rfl:    zinc  gluconate 50 MG tablet, Take 50 mg by mouth daily., Disp: , Rfl:    budesonide -formoterol  (SYMBICORT ) 80-4.5 MCG/ACT inhaler, Inhale 2 puffs into the lungs 2 (two) times daily., Disp: 1 each, Rfl: 6

## 2023-10-22 ENCOUNTER — Other Ambulatory Visit: Payer: Self-pay | Admitting: Internal Medicine

## 2023-10-22 DIAGNOSIS — E785 Hyperlipidemia, unspecified: Secondary | ICD-10-CM

## 2023-10-23 LAB — LAB REPORT - SCANNED
Albumin, Urine POC: 3.6
Creatinine, POC: 58.5 mg/dL
Microalb Creat Ratio: 6

## 2023-10-25 ENCOUNTER — Encounter: Payer: Self-pay | Admitting: Pulmonary Disease

## 2023-10-29 ENCOUNTER — Ambulatory Visit
Admission: RE | Admit: 2023-10-29 | Discharge: 2023-10-29 | Disposition: A | Source: Ambulatory Visit | Attending: Internal Medicine | Admitting: Internal Medicine

## 2023-10-29 DIAGNOSIS — E785 Hyperlipidemia, unspecified: Secondary | ICD-10-CM

## 2023-11-09 ENCOUNTER — Encounter: Payer: Self-pay | Admitting: Podiatry

## 2023-11-09 ENCOUNTER — Ambulatory Visit: Admitting: Podiatry

## 2023-11-09 ENCOUNTER — Ambulatory Visit (INDEPENDENT_AMBULATORY_CARE_PROVIDER_SITE_OTHER)

## 2023-11-09 DIAGNOSIS — M7751 Other enthesopathy of right foot: Secondary | ICD-10-CM

## 2023-11-09 DIAGNOSIS — D3610 Benign neoplasm of peripheral nerves and autonomic nervous system, unspecified: Secondary | ICD-10-CM

## 2023-11-09 MED ORDER — TRIAMCINOLONE ACETONIDE 10 MG/ML IJ SUSP
10.0000 mg | Freq: Once | INTRAMUSCULAR | Status: AC
  Administered 2023-11-09: 10 mg via INTRA_ARTICULAR

## 2023-11-09 NOTE — Progress Notes (Signed)
 Subjective:   Patient ID: [name], female   DOB: 68 y.o.   MRN: [id number]   HPI Patient presents with a lot of shooting pain third interspace right foot and states that has been going on about 5 months with zinging pain mostly to the fourth toe   ROS      Objective:  Physical Exam  Neurovascular status intact with quite a bit of discomfort third interspace right positive Mulder sign sore when pressed     Assessment:  Probability for neuroma over capsulitis third interspace right     Plan:  H&P reviewed and the other 1 was resected this 1 may need to be resected and she knows that the wants to try something temporary and hopefully will do good for her and she will need surgery.  I went ahead today did sterile prep and I injected the nerve with 3 mg dexamethasone  Kenalog  5 mg Xylocaine  advised on wider shoes and reappoint to recheck and may require surgical removal depending on response  X-rays were negative for signs of stress fracture arthritis or other bone pathology

## 2023-11-26 ENCOUNTER — Ambulatory Visit (INDEPENDENT_AMBULATORY_CARE_PROVIDER_SITE_OTHER): Admitting: Cardiology

## 2023-11-26 ENCOUNTER — Encounter (HOSPITAL_BASED_OUTPATIENT_CLINIC_OR_DEPARTMENT_OTHER): Payer: Self-pay | Admitting: Cardiology

## 2023-11-26 VITALS — BP 136/66 | HR 79 | Ht 64.5 in | Wt 160.8 lb

## 2023-11-26 DIAGNOSIS — I10 Essential (primary) hypertension: Secondary | ICD-10-CM

## 2023-11-26 DIAGNOSIS — I2510 Atherosclerotic heart disease of native coronary artery without angina pectoris: Secondary | ICD-10-CM | POA: Diagnosis not present

## 2023-11-26 DIAGNOSIS — Z7189 Other specified counseling: Secondary | ICD-10-CM | POA: Diagnosis not present

## 2023-11-26 DIAGNOSIS — E782 Mixed hyperlipidemia: Secondary | ICD-10-CM | POA: Diagnosis not present

## 2023-11-26 DIAGNOSIS — Z712 Person consulting for explanation of examination or test findings: Secondary | ICD-10-CM

## 2023-11-26 MED ORDER — FENOFIBRIC ACID 135 MG PO CPDR: 1.0000 | DELAYED_RELEASE_CAPSULE | Freq: Every day | ORAL | 3 refills | Status: AC

## 2023-11-26 MED ORDER — ASPIRIN 81 MG PO TBEC: 81.0000 mg | DELAYED_RELEASE_TABLET | Freq: Every day | ORAL | Status: DC

## 2023-11-26 MED ORDER — EZETIMIBE 10 MG PO TABS: 10.0000 mg | ORAL_TABLET | Freq: Every day | ORAL | 3 refills | Status: AC

## 2023-11-26 MED ORDER — SPIRONOLACTONE 25 MG PO TABS: 12.5000 mg | ORAL_TABLET | Freq: Every day | ORAL | 3 refills | Status: AC

## 2023-11-26 MED ORDER — OLMESARTAN MEDOXOMIL 20 MG PO TABS: 20.0000 mg | ORAL_TABLET | Freq: Every day | ORAL | 3 refills | Status: AC

## 2023-11-26 NOTE — Patient Instructions (Signed)
 Medication Instructions:  Your physician has recommended you make the following change in your medication:  1.) start aspirin  81 mg - take one tablet daily  *If you need a refill on your cardiac medications before your next appointment, please call your pharmacy*  Lab Work: none  Testing/Procedures: none  Follow-Up: At [facility], you and your health needs are our priority.  As part of our continuing mission to provide you with exceptional heart care, our providers are all part of one team.  This team includes your primary Cardiologist (physician) and Advanced Practice Providers or APPs (Physician Assistants and Nurse Practitioners) who all work together to provide you with the care you need, when you need it.  Your next appointment:   12 month(s)  Provider:   Shelda Bruckner, MD, Rosaline Bane, NP, or Reche Finder, NP

## 2023-11-26 NOTE — Progress Notes (Signed)
 Cardiology Office Note:  .   Date:  11/26/2023  ID:  Megan Savage, DOB 1955/08/31, MRN [id number] PCP: Onita Rush, MD  [facility] Providers Cardiologist:  Shelda Bruckner, MD Cardiology APP:  Madie Jon Garre, PA {  History of Present Illness: .   Megan Savage is a 68 y.o. female with PMH hypertension, mixed hyperlipidemia, prediabetes, hypothyroidism. She previously followed with Dr. Burnard and established care with me on 11/26/23.  Pertinent CV history: Hypertension since at least 2012. Mixed hyperlipidemia, peak TG 1305 in 2015. Ca score 0 in 2019, repeat 10/2023 was 35.7 (62%ile).  FH: mom died age 75 of arrhythmia.  Today: Here to establish care with me today. Has rare chest tightness that lasts about 30-45 seconds. Felt like heartburn. None in 2 mos, happens rarely. No clear association with food, reclining, activity.   Since her visit with Dr. Onita, labs weren't what she wanted, has made significant lifestyle changes and has lost weight. Wants to do with lifestyle and not meds. Highest adult weight 200 lbs in the past, was 174 lbs about 5 weeks ago. Last lipids I can see were from 08/21/22, Tchol 132, HDL 43, LDL 69, TG 101.   Brings recent labs from Dr. Onita 10/15/23. Tchol 127, TG 75, HDL 46, LDL 66. Reports lpa was normal. ApoB 66 (normal).  Discussed calcium  score, guidelines, recommendations. She does bruise easily but is willing to try aspirin , has tolerated post surgery in the past.   Runs systolics in the 120s at home, always a bit elevated in the office.  ROS: Denies shortness of breath at rest or with normal exertion. No PND, orthopnea, LE edema or unexpected weight gain. No syncope or palpitations. ROS otherwise negative except as noted.   Studies Reviewed: SABRA    EKG:       Physical Exam:   VS:  BP 136/66   Pulse 79   Ht 5' 4.5 (1.638 m)   Wt 160 lb 12.8 oz (72.9 kg)   SpO2 98%   BMI 27.17 kg/m    Wt Readings from Last 3 Encounters:   11/26/23 160 lb 12.8 oz (72.9 kg)  10/21/23 164 lb (74.4 kg)  03/05/23 172 lb (78 kg)    GEN: Well nourished, well developed in no acute distress HEENT: Normal, moist mucous membranes NECK: No JVD CARDIAC: regular rhythm, normal S1 and S2, no rubs or gallops. No murmur. VASCULAR: Radial and DP pulses 2+ bilaterally. No carotid bruits RESPIRATORY:  Clear to auscultation without rales, wheezing or rhonchi  ABDOMEN: Soft, non-tender, non-distended MUSCULOSKELETAL:  Ambulates independently SKIN: Warm and dry, no edema NEUROLOGIC:  Alert and oriented x 3. No focal neuro deficits noted. PSYCHIATRIC:  Normal affect    ASSESSMENT AND PLAN: .    Hypertension -currently on olmesartan , spironolactone . Well controlled at home. Continue current regimen  Coronary artery calcification Mixed hyperlipidemia -Ca score 35.7 10/2023, reviewed results together -discussed recommendations on lipids, statins, aspirin  -currently on ezetimibe , fenofibrate , OTC fish oil  -with lipids at goal, will continue current regimen -was remotely on rosuvastatin , did not tolerate  CV risk counseling and prevention -recommend heart healthy/Mediterranean diet, with whole grains, fruits, vegetable, fish, lean meats, nuts, and olive oil. Limit salt. -recommend moderate walking, 3-5 times/week for 30-50 minutes each session. Aim for at least 150 minutes/week. Goal should be pace of 3 miles/hours, or walking 1.5 miles in 30 minutes -recommend avoidance of tobacco products. Avoid excess alcohol .  Dispo: 1 year or sooner as needed  Signed, Shelda Bruckner, MD   Shelda Bruckner, MD, PhD, [facility]  [facility]  [place]  [facility] at [facility] at [facility] [address] [place], [place] [address] [phone]

## 2024-01-01 ENCOUNTER — Telehealth (HOSPITAL_BASED_OUTPATIENT_CLINIC_OR_DEPARTMENT_OTHER): Payer: Self-pay | Admitting: *Deleted

## 2024-01-01 ENCOUNTER — Telehealth: Payer: Self-pay

## 2024-01-01 NOTE — Telephone Encounter (Signed)
   Name: Megan Savage  DOB: 1955-05-03  MRN: [id number]  Primary Cardiologist: Shelda Bruckner, MD  Preoperative team, please contact this patient and set up a phone call appointment in early to mid February for further preoperative risk assessment. Please obtain consent and complete medication review. Thank you for your help. Let her know if she has any symptoms in the meantime between now and then, to call and we will switch to in-person visit.  Re: ASA, on this with coronary calcification but do not see overt contraindication to holding if confirmed at OV.  I also confirmed the patient resides in the state of [place] . As per [facility] Medical Board telemedicine laws, the patient must reside in the state in which the provider is licensed.   Raphael LOISE Bring, PA-C 01/01/2024, 2:24 PM [facility]

## 2024-01-01 NOTE — Telephone Encounter (Signed)
   Pre-operative Risk Assessment    Patient Name: Megan Savage  DOB: 30-Jun-1955 MRN: [id number]   Date of last office visit: 11/26/2023 Date of next office visit: None  Request for Surgical Clearance    Procedure:  Right Total Knee Arthroplasty  Date of Surgery:  Clearance 04/05/24                                 Surgeon:  Dr. Donnice Car Surgeon's Group or Practice Name:  [facility] Phone number:  [phone] Fax number:  [phone]   Type of Clearance Requested:   - Medical  - Pharmacy:  Hold Aspirin  Not Indicated   Type of Anesthesia:  Spinal   Additional requests/questions:    Signed, Edsel Grayce Sanders   01/01/2024, 2:14 PM

## 2024-01-01 NOTE — Telephone Encounter (Signed)
 Called patient to schedule a televisit appointment for a per-op clearance on 03/14/24 @ 9:00. Meds, Rec, and consent done.

## 2024-01-01 NOTE — Telephone Encounter (Signed)
 Called patient to schedule a televisit appointment for a per-op clearance on 03/14/24 @ 9:00. Meds, Rec, and consent done.           Patient Consent for Virtual Visit        Megan Savage has provided verbal consent on 01/01/2024 for a virtual visit (video or telephone).   CONSENT FOR VIRTUAL VISIT FOR:  Megan Savage  By participating in this virtual visit I agree to the following:  I hereby voluntarily request, consent and authorize [facility] and its employed or contracted physicians, physician assistants, nurse practitioners or other licensed health care professionals (the Practitioner), to provide me with telemedicine health care services (the "Services) as deemed necessary by the treating Practitioner. I acknowledge and consent to receive the Services by the Practitioner via telemedicine. I understand that the telemedicine visit will involve communicating with the Practitioner through live audiovisual communication technology and the disclosure of certain medical information by electronic transmission. I acknowledge that I have been given the opportunity to request an in-person assessment or other available alternative prior to the telemedicine visit and am voluntarily participating in the telemedicine visit.  I understand that I have the right to withhold or withdraw my consent to the use of telemedicine in the course of my care at any time, without affecting my right to future care or treatment, and that the Practitioner or I may terminate the telemedicine visit at any time. I understand that I have the right to inspect all information obtained and/or recorded in the course of the telemedicine visit and may receive copies of available information for a reasonable fee.  I understand that some of the potential risks of receiving the Services via telemedicine include:  Delay or interruption in medical evaluation due to technological equipment failure or disruption; Information  transmitted may not be sufficient (e.g. poor resolution of images) to allow for appropriate medical decision making by the Practitioner; and/or  In rare instances, security protocols could fail, causing a breach of personal health information.  Furthermore, I acknowledge that it is my responsibility to provide information about my medical history, conditions and care that is complete and accurate to the best of my ability. I acknowledge that Practitioner's advice, recommendations, and/or decision may be based on factors not within their control, such as incomplete or inaccurate data provided by me or distortions of diagnostic images or specimens that may result from electronic transmissions. I understand that the practice of medicine is not an exact science and that Practitioner makes no warranties or guarantees regarding treatment outcomes. I acknowledge that a copy of this consent can be made available to me via my patient portal [facility] MyChart), or I can request a printed copy by calling the office of [facility].    I understand that my insurance will be billed for this visit.   I have read or had this consent read to me. I understand the contents of this consent, which adequately explains the benefits and risks of the Services being provided via telemedicine.  I have been provided ample opportunity to ask questions regarding this consent and the Services and have had my questions answered to my satisfaction. I give my informed consent for the services to be provided through the use of telemedicine in my medical care

## 2024-01-29 ENCOUNTER — Other Ambulatory Visit: Payer: Self-pay | Admitting: Obstetrics and Gynecology

## 2024-01-29 DIAGNOSIS — Z1231 Encounter for screening mammogram for malignant neoplasm of breast: Secondary | ICD-10-CM

## 2024-02-06 ENCOUNTER — Encounter (HOSPITAL_BASED_OUTPATIENT_CLINIC_OR_DEPARTMENT_OTHER): Payer: Self-pay | Admitting: Cardiology

## 2024-02-09 NOTE — Telephone Encounter (Signed)
 Pt sent a mychart asking to reschedule tele appointment. Surgery got moved up to 03/08/24. Pt has been scheduled for tele visit 02/22/24 at 1:40 pm.

## 2024-02-22 ENCOUNTER — Ambulatory Visit

## 2024-02-22 NOTE — Progress Notes (Signed)
 Sent message, via epic in basket, requesting orders in epic from Careers adviser.

## 2024-02-29 ENCOUNTER — Ambulatory Visit

## 2024-02-29 ENCOUNTER — Encounter (HOSPITAL_COMMUNITY): Admission: RE | Admit: 2024-02-29

## 2024-02-29 ENCOUNTER — Encounter (HOSPITAL_BASED_OUTPATIENT_CLINIC_OR_DEPARTMENT_OTHER): Payer: Self-pay | Admitting: Cardiology

## 2024-02-29 DIAGNOSIS — Z01810 Encounter for preprocedural cardiovascular examination: Secondary | ICD-10-CM | POA: Diagnosis not present

## 2024-03-01 ENCOUNTER — Telehealth (HOSPITAL_BASED_OUTPATIENT_CLINIC_OR_DEPARTMENT_OTHER): Payer: Self-pay | Admitting: *Deleted

## 2024-03-01 NOTE — Telephone Encounter (Signed)
 Pt returning call in regards to previous phone note.

## 2024-03-01 NOTE — Telephone Encounter (Signed)
 I called pt back and she tells me that she was reading the notes from her preop tele appt yesterday and she saw a number of health conditions under H/O and she did not know that meant history of. Pt thanked me for the call back and that she answered her own question and is all good now.

## 2024-03-01 NOTE — Telephone Encounter (Signed)
 I left a message for the pt to call back and discuss the recommendations from Josefa Beauvais, FNP in regard to the question the pt had about medications. See the previous notes.    Message Details Received: Yesterday Message Subject: RE: Notes from Telephone Screening for Knee Surgery   Cleaver, Josefa HERO, NP  Wynetta Niels HERO, CMA      Notes from Telephone Screening for Knee Surgery (Newest Message First)            Beauvais Josefa HERO, NP to Me (Selected Message)     02/29/24  4:38 PM A coronary calcium  score greater than 0 signifies patient has plaque in her coronary arteries.  This in turn means that they need to be treated for hyperlipidemia.  Patient has listed comorbidities.  These were taken from previous cardiology note.  Please contact patient and explained that coronary calcium  means that they have coronary plaque.  I would recommend that she continue her current medication regimen for these comorbidities.  Thank you for your help.   Josefa HERO. Cleaver NP-C      02/29/2024, 4:38 PM [facility] [address] 5[address] [place], [place] [address] Office [phone] THEORA Sanders, Edsel SAUNDERS to [place] Josefa HERO, NP     02/29/24  4:28 PM Walterine Josefa,   Looks like you did this pt's tele pre op today. Please advise and send pt message back.   Best, Edsel Levorn SAUNDERS [place] to P Cv Div Dwb Triage (supporting Shelda Bruckner, MD)     02/29/24  4:01 PM I just had my telephone screening for my upcoming knee replacement surgery scheduled for next week. I see from the My Chart Summary that I have co-morbid illnesses, defined as hypertension, coronary artery disease, and hyperlipidemia.   The labs from my annual visit to Dr. Onita do not support this. My lipid panel was excellent at this visit, and my calcium  score is 35, which does not suggest coronary artery disease.   Could someone please clarify?   Thank you.

## 2024-03-03 ENCOUNTER — Encounter (HOSPITAL_COMMUNITY): Payer: Self-pay

## 2024-03-03 ENCOUNTER — Encounter (HOSPITAL_COMMUNITY)
Admission: RE | Admit: 2024-03-03 | Discharge: 2024-03-03 | Disposition: A | Source: Ambulatory Visit | Attending: Orthopedic Surgery

## 2024-03-03 ENCOUNTER — Other Ambulatory Visit: Payer: Self-pay

## 2024-03-03 VITALS — BP 129/61 | HR 77 | Temp 98.6°F | Resp 18 | Ht 64.5 in | Wt 164.0 lb

## 2024-03-03 DIAGNOSIS — I10 Essential (primary) hypertension: Secondary | ICD-10-CM

## 2024-03-03 DIAGNOSIS — Z01818 Encounter for other preprocedural examination: Secondary | ICD-10-CM

## 2024-03-03 LAB — CBC
HCT: 41.1 % (ref 36.0–46.0)
Hemoglobin: 13.4 g/dL (ref 12.0–15.0)
MCH: 30.2 pg (ref 26.0–34.0)
MCHC: 32.6 g/dL (ref 30.0–36.0)
MCV: 92.8 fL (ref 80.0–100.0)
Platelets: 269 10*3/uL (ref 150–400)
RBC: 4.43 MIL/uL (ref 3.87–5.11)
RDW: 11.9 % (ref 11.5–15.5)
WBC: 5.1 10*3/uL (ref 4.0–10.5)
nRBC: 0 % (ref 0.0–0.2)

## 2024-03-03 LAB — BASIC METABOLIC PANEL WITH GFR
Anion gap: 11 (ref 5–15)
BUN: 17 mg/dL (ref 8–23)
CO2: 23 mmol/L (ref 22–32)
Calcium: 9.6 mg/dL (ref 8.9–10.3)
Chloride: 102 mmol/L (ref 98–111)
Creatinine, Ser: 0.53 mg/dL (ref 0.44–1.00)
GFR, Estimated: >60 mL/min
Glucose, Bld: 172 mg/dL — ABNORMAL HIGH (ref 70–99)
Potassium: 4.1 mmol/L (ref 3.5–5.1)
Sodium: 136 mmol/L (ref 135–145)

## 2024-03-03 LAB — SURGICAL PCR SCREEN
MRSA, PCR: NEGATIVE
Staphylococcus aureus: NEGATIVE

## 2024-03-03 NOTE — Progress Notes (Signed)
 For Anesthesia: PCP - Norleen Jungling, MD  Cardiologist -  Shelda Bruckner, MD cardiac clearance J. Cleaver NP 02/29/24 in [facility]  Bowel Prep reminder:  Chest x-ray - greater than 1 year EKG -  Stress Test -  ECHO - 10/14/21 in [facility] Cardiac Cath -  Pacemaker/ICD device last checked: Pacemaker orders received: Device Rep notified:  Spinal Cord Stimulator:  Sleep Study -  CPAP -   Fasting Blood Sugar -  Checks Blood Sugar _____ times a day Date and result of last Hgb A1c-  Last dose of GLP1 agonist-  GLP1 instructions: Hold 7 days prior to schedule (Hold 24 hours-daily)   Last dose of SGLT-2 inhibitors-  SGLT-2 instructions: Hold 72 hours prior to surgery  Blood Thinner Instructions: Last Dose: Time last taken:  Aspirin  Instructions: Last Dose: Time last taken:  Activity level: Can go up a flight of stairs and activities of daily living without stopping and without chest pain and/or shortness of breath   Able to exercise without chest pain and/or shortness of breath   Unable to go up a flight of stairs without chest pain and/or shortness of breath     Anesthesia review: CAD  Patient denies shortness of breath, fever, cough and chest pain at PAT appointment   Patient verbalized understanding of instructions that were reviewed over the telephone.

## 2024-03-03 NOTE — Patient Instructions (Signed)
 SURGICAL WAITING ROOM VISITATION  Patients having surgery or a procedure may have no more than 2 support people in the waiting area - these visitors may rotate.    Children ages 10 and under will not be able to visit patients in [facility] under most circumstances.   Visitors with respiratory illnesses are discouraged from visiting and should remain at home.  If the patient needs to stay at the hospital during part of their recovery, the visitor guidelines for inpatient rooms apply. Pre-op nurse will coordinate an appropriate time for 1 support person to accompany patient in pre-op.  This support person may not rotate.    Please refer to the [facility] website for the visitor guidelines for Inpatients (after your surgery is over and you are in a regular room).       Your procedure is scheduled on: Tuesday, Feb. 10, 2026   Report to [facility] Main Entrance    Report to admitting at  1:00 PM   Call this number if you have problems the morning of surgery [phone]   Do not eat food :After Midnight.   After Midnight you may have the following liquids until 12:30 PM DAY OF SURGERY  Water  Non-Citrus Juices (without pulp, NO RED-Apple, White grape, White cranberry) Black Coffee (NO MILK/CREAM OR CREAMERS, sugar ok)  Clear Tea (NO MILK/CREAM OR CREAMERS, sugar ok) regular and decaf                             Plain Jell-O (NO RED)                                           Fruit ices (not with fruit pulp, NO RED)                                     Popsicles (NO RED)                                                               Sports drinks like Gatorade (NO RED)                   The day of surgery:  Drink ONE (1) Pre-Surgery Clear Ensure at 12:30 PM the morning of surgery. Drink in one sitting. Do not sip.  This drink was given to you during your hospital  pre-op appointment visit. Nothing else to drink after completing the  Pre-Surgery Clear Ensure.           If you have questions, please contact your surgeons office.     Oral Hygiene is also important to reduce your risk of infection.                                    Remember - BRUSH YOUR TEETH THE MORNING OF SURGERY WITH YOUR REGULAR TOOTHPASTE  DENTURES WILL BE REMOVED PRIOR TO SURGERY PLEASE DO NOT APPLY Poly grip OR ADHESIVES!!!   Do NOT  smoke after Midnight   Stop all vitamins and herbal supplements 7 days before surgery.   Take these medicines the morning of surgery with A SIP OF WATER :  Ezetimibe  Fexofenadine  Levothyroxine  Bring Symbicort                                You may not have any metal on your body including hair pins, jewelry, and body piercing             Do not wear make-up, lotions, powders, perfumes/cologne, or deodorant  Do not wear nail polish including gel and S&S, artificial/acrylic nails, or any other type of covering on natural nails including finger and toenails. If you have artificial nails, gel coating, etc. that needs to be removed by a nail salon please have this removed prior to surgery or surgery may need to be canceled/ delayed if the surgeon/ anesthesia feels like they are unable to be safely monitored.   Do not shave  48 hours prior to surgery.    Do not bring valuables to the hospital. [facility] IS NOT             RESPONSIBLE   FOR VALUABLES.   Contacts, glasses, dentures or bridgework may not be worn into surgery.   Bring small overnight bag day of surgery.   DO NOT BRING YOUR HOME MEDICATIONS TO THE HOSPITAL. PHARMACY WILL DISPENSE MEDICATIONS LISTED ON YOUR MEDICATION LIST TO YOU DURING YOUR ADMISSION IN THE HOSPITAL!    Patients discharged on the day of surgery will not be allowed to drive home.  Someone NEEDS to stay with you for the first 24 hours after anesthesia.   Special Instructions: Bring a copy of your healthcare power of attorney and living will documents the day of surgery if you haven't scanned them before.               Please read over the following fact sheets you were given: IF YOU HAVE QUESTIONS ABOUT YOUR PRE-OP INSTRUCTIONS PLEASE CALL [phone].   If you received a COVID test during your pre-op visit  it is requested that you wear a mask when out in public, stay away from anyone that may not be feeling well and notify your surgeon if you develop symptoms. If you test positive for Covid or have been in contact with anyone that has tested positive in the last 10 days please notify you surgeon.      Pre-operative 4 CHG Bath Instructions  DYNA-Hex 4 Chlorhexidine  Gluconate 4% Solution Antiseptic 4 fl. oz   You can play a key role in reducing the risk of infection after surgery. Your skin needs to be as free of germs as possible. You can reduce the number of germs on your skin by washing with CHG (chlorhexidine  gluconate) soap before surgery. CHG is an antiseptic soap that kills germs and continues to kill germs even after washing.   DO NOT use if you have an allergy  to chlorhexidine /CHG or antibacterial soaps. If your skin becomes reddened or irritated, stop using the CHG and notify one of our RNs at   Please shower with the CHG soap starting 4 days before surgery using the following schedule:     Please keep in mind the following:  DO NOT shave, including legs and underarms, starting the day of your first shower.   You may shave your face at any point before/day of surgery.  Place clean  sheets on your bed the day you start using CHG soap. Use a clean washcloth (not used since being washed) for each shower. DO NOT sleep with pets once you start using the CHG.  CHG Shower Instructions:  If you choose to wash your hair and private area, wash first with your normal shampoo/soap.  After you use shampoo/soap, rinse your hair and body thoroughly to remove shampoo/soap residue.  Turn the water  OFF and apply about 3 tablespoons (45 ml) of CHG soap to a CLEAN washcloth.  Apply CHG soap ONLY FROM YOUR  NECK DOWN TO YOUR TOES (washing for 3-5 minutes)  DO NOT use CHG soap on face, private areas, open wounds, or sores.  Pay special attention to the area where your surgery is being performed.  If you are having back surgery, having someone wash your back for you may be helpful. Wait 2 minutes after CHG soap is applied, then you may rinse off the CHG soap.  Pat dry with a clean towel  Put on clean clothes/pajamas   If you choose to wear lotion, please use ONLY the CHG-compatible lotions on the back of this paper.     Additional instructions for the day of surgery: DO NOT APPLY any lotions, deodorants, cologne, or perfumes.   Put on clean/comfortable clothes.  Brush your teeth.  Ask your nurse before applying any prescription medications to the skin.   CHG Compatible Lotions   Aveeno Moisturizing lotion  Cetaphil Moisturizing Cream  Cetaphil Moisturizing Lotion  Clairol Herbal Essence Moisturizing Lotion, Dry Skin  Clairol Herbal Essence Moisturizing Lotion, Extra Dry Skin  Clairol Herbal Essence Moisturizing Lotion, Normal Skin  Curel Age Defying Therapeutic Moisturizing Lotion with Alpha Hydroxy  Curel Extreme Care Body Lotion  Curel Soothing Hands Moisturizing Hand Lotion  Curel Therapeutic Moisturizing Cream, Fragrance-Free  Curel Therapeutic Moisturizing Lotion, Fragrance-Free  Curel Therapeutic Moisturizing Lotion, Original Formula  Eucerin Daily Replenishing Lotion  Eucerin Dry Skin Therapy Plus Alpha Hydroxy Crme  Eucerin Dry Skin Therapy Plus Alpha Hydroxy Lotion  Eucerin Original Crme  Eucerin Original Lotion  Eucerin Plus Crme Eucerin Plus Lotion  Eucerin TriLipid Replenishing Lotion  Keri Anti-Bacterial Hand Lotion  Keri Deep Conditioning Original Lotion Dry Skin Formula Softly Scented  Keri Deep Conditioning Original Lotion, Fragrance Free Sensitive Skin Formula  Keri Lotion Fast Absorbing Fragrance Free Sensitive Skin Formula  Keri Lotion Fast Absorbing Softly  Scented Dry Skin Formula  Keri Original Lotion  Keri Skin Renewal Lotion Keri Silky Smooth Lotion  Keri Silky Smooth Sensitive Skin Lotion  Nivea Body Creamy Conditioning Oil  Nivea Body Extra Enriched Lotion  Nivea Body Original Lotion  Nivea Body Sheer Moisturizing Lotion Nivea Crme  Nivea Skin Firming Lotion  NutraDerm 30 Skin Lotion  NutraDerm Skin Lotion  NutraDerm Therapeutic Skin Cream  NutraDerm Therapeutic Skin Lotion  ProShield Protective Hand Cream  Provon moisturizing lotionIncentive Spirometer (Watch this video at home: [email or web address])  An incentive spirometer is a tool that can help keep your lungs clear and active. This tool measures how well you are filling your lungs with each breath. Taking long deep breaths may help reverse or decrease the chance of developing breathing (pulmonary) problems (especially infection) following: A long period of time when you are unable to move or be active. BEFORE THE PROCEDURE  If the spirometer includes an indicator to show your best effort, your nurse or respiratory therapist will set it to a desired goal. If possible, sit up straight or lean slightly forward. Try  not to slouch. Hold the incentive spirometer in an upright position. INSTRUCTIONS FOR USE  Sit on the edge of your bed if possible, or sit up as far as you can in bed or on a chair. Hold the incentive spirometer in an upright position. Breathe out normally. Place the mouthpiece in your mouth and seal your lips tightly around it. Breathe in slowly and as deeply as possible, raising the piston or the ball toward the top of the column. Hold your breath for 3-5 seconds or for as long as possible. Allow the piston or ball to fall to the bottom of the column. Remove the mouthpiece from your mouth and breathe out normally. Rest for a few seconds and repeat Steps 1 through 7 at least 10 times every 1-2 hours when you are awake. Take your time and take a  few normal breaths between deep breaths. The spirometer may include an indicator to show your best effort. Use the indicator as a goal to work toward during each repetition. After each set of 10 deep breaths, practice coughing to be sure your lungs are clear. If you have an incision (the cut made at the time of surgery), support your incision when coughing by placing a pillow or rolled up towels firmly against it. Once you are able to get out of bed, walk around indoors and cough well. You may stop using the incentive spirometer when instructed by your caregiver.  RISKS AND COMPLICATIONS Take your time so you do not get dizzy or light-headed. If you are in pain, you may need to take or ask for pain medication before doing incentive spirometry. It is harder to take a deep breath if you are having pain. AFTER USE Rest and breathe slowly and easily. It can be helpful to keep track of a log of your progress. Your caregiver can provide you with a simple table to help with this. If you are using the spirometer at home, follow these instructions: SEEK MEDICAL CARE IF:  You are having difficultly using the spirometer. You have trouble using the spirometer as often as instructed. Your pain medication is not giving enough relief while using the spirometer. You develop fever of 100.5 F (38.1 C) or higher. SEEK IMMEDIATE MEDICAL CARE IF:  You cough up bloody sputum that had not been present before. You develop fever of 102 F (38.9 C) or greater. You develop worsening pain at or near the incision site. MAKE SURE YOU:  Understand these instructions. Will watch your condition. Will get help right away if you are not doing well or get worse. Document Released: 05/26/2006 Document Revised: 04/07/2011 Document Reviewed: 07/27/2006 [facility] Patient Information 2014 [place], [place].

## 2024-03-03 NOTE — Progress Notes (Signed)
 For Anesthesia: PCP - Norleen Jungling, MD  Cardiologist -  Shelda Bruckner, MD cardiac clearance J. Cleaver NP 02/29/24 in [facility]   Bowel Prep reminder: N/A   Chest x-ray - greater than 1 year EKG - 03/03/24 in [facility] Stress Test - over 30 years ago ECHO - 10/14/21 in [facility] Cardiac Cath - N/A Pacemaker/ICD device last checked:N/A Pacemaker orders received: N/A Device Rep notified: N/A   Spinal Cord Stimulator: N/A   Sleep Study - N/A CPAP - N/A   Fasting Blood Sugar - N/A Checks Blood Sugar __N/A___ times a day Date and result of last Hgb A1c-N/A   Last dose of GLP1 agonist- N/A GLP1 instructions: Hold 7 days prior to schedule (Hold 24 hours-daily)     Last dose of SGLT-2 inhibitors- N/A SGLT-2 instructions: Hold 72 hours prior to surgery   Blood Thinner Instructions: N/A Last Dose:N/A Time last taken:N/A   Aspirin  Instructions: N/A Last Dose:N/A Time last taken:N/A   Activity level: Able to exercise without chest pain and/or shortness of breath                               Anesthesia review: HTN   Patient denies shortness of breath, fever, cough and chest pain at PAT appointment     Patient verbalized understanding of instructions that were reviewed over the telephone.

## 2024-03-07 ENCOUNTER — Ambulatory Visit: Admitting: Podiatry

## 2024-03-08 ENCOUNTER — Encounter (HOSPITAL_COMMUNITY): Admission: RE | Payer: Self-pay | Source: Ambulatory Visit

## 2024-03-08 ENCOUNTER — Ambulatory Visit (HOSPITAL_COMMUNITY): Admit: 2024-03-08 | Admitting: Orthopedic Surgery

## 2024-03-14 ENCOUNTER — Ambulatory Visit

## 2024-03-29 ENCOUNTER — Ambulatory Visit

## 2024-03-31 ENCOUNTER — Encounter (HOSPITAL_COMMUNITY)
